# Patient Record
Sex: Female | Born: 1960
Health system: Southern US, Community
[De-identification: ages and names within clinical notes are randomized; demographics above are authoritative.]

## PROBLEM LIST (undated history)

## (undated) DIAGNOSIS — I10 Essential (primary) hypertension: Secondary | ICD-10-CM

## (undated) DIAGNOSIS — G43019 Migraine without aura, intractable, without status migrainosus: Secondary | ICD-10-CM

## (undated) DIAGNOSIS — R259 Unspecified abnormal involuntary movements: Secondary | ICD-10-CM

## (undated) DIAGNOSIS — Z9289 Personal history of other medical treatment: Secondary | ICD-10-CM

## (undated) DIAGNOSIS — J45909 Unspecified asthma, uncomplicated: Secondary | ICD-10-CM

## (undated) DIAGNOSIS — K219 Gastro-esophageal reflux disease without esophagitis: Secondary | ICD-10-CM

## (undated) DIAGNOSIS — J329 Chronic sinusitis, unspecified: Secondary | ICD-10-CM

## (undated) DIAGNOSIS — F329 Major depressive disorder, single episode, unspecified: Secondary | ICD-10-CM

## (undated) DIAGNOSIS — L408 Other psoriasis: Secondary | ICD-10-CM

## (undated) DIAGNOSIS — M1990 Unspecified osteoarthritis, unspecified site: Secondary | ICD-10-CM

## (undated) DIAGNOSIS — K449 Diaphragmatic hernia without obstruction or gangrene: Secondary | ICD-10-CM

## (undated) DIAGNOSIS — F32A Depression, unspecified: Secondary | ICD-10-CM

## (undated) DIAGNOSIS — L709 Acne, unspecified: Secondary | ICD-10-CM

## (undated) DIAGNOSIS — F319 Bipolar disorder, unspecified: Secondary | ICD-10-CM

## (undated) DIAGNOSIS — R251 Tremor, unspecified: Secondary | ICD-10-CM

## (undated) DIAGNOSIS — T7840XA Allergy, unspecified, initial encounter: Secondary | ICD-10-CM

## (undated) HISTORY — DX: Depression, unspecified: F32.A

## (undated) HISTORY — DX: Acne, unspecified: L70.9

## (undated) HISTORY — DX: Diaphragmatic hernia without obstruction or gangrene: K44.9

## (undated) HISTORY — DX: Allergy, unspecified, initial encounter: T78.40XA

## (undated) HISTORY — DX: Other psoriasis: L40.8

## (undated) HISTORY — DX: Gastro-esophageal reflux disease without esophagitis: K21.9

## (undated) HISTORY — DX: Essential (primary) hypertension: I10

## (undated) HISTORY — DX: Unspecified abnormal involuntary movements: R25.9

## (undated) HISTORY — PX: JOINT REPLACEMENT: SHX530

## (undated) HISTORY — DX: Chronic sinusitis, unspecified: J32.9

## (undated) HISTORY — DX: Bipolar disorder, unspecified: F31.9

## (undated) HISTORY — DX: Personal history of other medical treatment: Z92.89

---
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
## 1997-01-10 HISTORY — PX: ESOPHAGOGASTRODUODENOSCOPY: SHX1529

## 1997-07-25 ENCOUNTER — Other Ambulatory Visit: Admission: RE | Admit: 1997-07-25 | Discharge: 1997-07-25 | Payer: Self-pay | Admitting: Obstetrics & Gynecology

## 1997-08-01 ENCOUNTER — Ambulatory Visit (HOSPITAL_COMMUNITY): Admission: RE | Admit: 1997-08-01 | Discharge: 1997-08-01 | Payer: Self-pay | Admitting: Obstetrics & Gynecology

## 1997-08-25 ENCOUNTER — Ambulatory Visit (HOSPITAL_COMMUNITY): Admission: RE | Admit: 1997-08-25 | Discharge: 1997-08-25 | Payer: Self-pay | Admitting: Obstetrics & Gynecology

## 1997-08-25 ENCOUNTER — Encounter: Payer: Self-pay | Admitting: Obstetrics & Gynecology

## 1998-07-31 ENCOUNTER — Other Ambulatory Visit: Admission: RE | Admit: 1998-07-31 | Discharge: 1998-07-31 | Payer: Self-pay | Admitting: Obstetrics & Gynecology

## 1999-08-27 ENCOUNTER — Other Ambulatory Visit: Admission: RE | Admit: 1999-08-27 | Discharge: 1999-08-27 | Payer: Self-pay | Admitting: Obstetrics & Gynecology

## 2000-09-14 ENCOUNTER — Other Ambulatory Visit: Admission: RE | Admit: 2000-09-14 | Discharge: 2000-09-14 | Payer: Self-pay | Admitting: Obstetrics & Gynecology

## 2002-03-18 HISTORY — PX: COLONOSCOPY: SHX174

## 2002-03-18 HISTORY — PX: ESOPHAGOGASTRODUODENOSCOPY: SHX1529

## 2002-03-22 ENCOUNTER — Other Ambulatory Visit: Admission: RE | Admit: 2002-03-22 | Discharge: 2002-03-22 | Payer: Self-pay | Admitting: Obstetrics & Gynecology

## 2002-05-21 ENCOUNTER — Ambulatory Visit (HOSPITAL_COMMUNITY): Admission: RE | Admit: 2002-05-21 | Discharge: 2002-05-21 | Payer: Self-pay | Admitting: Gastroenterology

## 2002-07-18 ENCOUNTER — Ambulatory Visit (HOSPITAL_COMMUNITY): Admission: RE | Admit: 2002-07-18 | Discharge: 2002-07-18 | Payer: Self-pay | Admitting: Surgery

## 2002-07-18 ENCOUNTER — Encounter: Payer: Self-pay | Admitting: Surgery

## 2002-08-22 ENCOUNTER — Encounter: Payer: Self-pay | Admitting: Surgery

## 2002-08-28 HISTORY — PX: LAPAROSCOPIC ESOPHAGOGASTRIC FUNDOPLASTY: SUR767

## 2002-08-29 ENCOUNTER — Inpatient Hospital Stay (HOSPITAL_COMMUNITY): Admission: RE | Admit: 2002-08-29 | Discharge: 2002-08-30 | Payer: Self-pay | Admitting: Surgery

## 2003-04-15 ENCOUNTER — Other Ambulatory Visit: Admission: RE | Admit: 2003-04-15 | Discharge: 2003-04-15 | Payer: Self-pay | Admitting: Obstetrics & Gynecology

## 2003-06-01 ENCOUNTER — Ambulatory Visit (HOSPITAL_COMMUNITY): Admission: RE | Admit: 2003-06-01 | Discharge: 2003-06-01 | Payer: Self-pay | Admitting: Family Medicine

## 2003-06-01 DIAGNOSIS — Z9289 Personal history of other medical treatment: Secondary | ICD-10-CM

## 2003-06-01 HISTORY — DX: Personal history of other medical treatment: Z92.89

## 2003-11-17 ENCOUNTER — Ambulatory Visit: Payer: Self-pay | Admitting: Internal Medicine

## 2003-11-21 LAB — HM DEXA SCAN: HM Dexa Scan: NORMAL

## 2004-04-09 ENCOUNTER — Ambulatory Visit: Payer: Self-pay | Admitting: Family Medicine

## 2004-04-13 ENCOUNTER — Ambulatory Visit: Payer: Self-pay | Admitting: Family Medicine

## 2004-09-09 ENCOUNTER — Other Ambulatory Visit: Admission: RE | Admit: 2004-09-09 | Discharge: 2004-09-09 | Payer: Self-pay | Admitting: Obstetrics & Gynecology

## 2004-09-10 ENCOUNTER — Ambulatory Visit: Payer: Self-pay | Admitting: Family Medicine

## 2004-09-14 ENCOUNTER — Ambulatory Visit: Payer: Self-pay | Admitting: Family Medicine

## 2004-10-21 ENCOUNTER — Ambulatory Visit: Payer: Self-pay | Admitting: Family Medicine

## 2004-11-12 ENCOUNTER — Ambulatory Visit: Payer: Self-pay | Admitting: Family Medicine

## 2005-01-11 ENCOUNTER — Encounter: Payer: Self-pay | Admitting: Family Medicine

## 2005-09-06 ENCOUNTER — Ambulatory Visit: Payer: Self-pay | Admitting: Family Medicine

## 2006-01-06 ENCOUNTER — Ambulatory Visit: Payer: Self-pay | Admitting: Family Medicine

## 2006-02-06 ENCOUNTER — Ambulatory Visit: Payer: Self-pay | Admitting: Family Medicine

## 2006-05-30 ENCOUNTER — Ambulatory Visit: Payer: Self-pay | Admitting: Family Medicine

## 2006-05-30 LAB — CONVERTED CEMR LAB
ALT: 17 units/L (ref 0–40)
AST: 18 units/L (ref 0–37)
Albumin: 3.9 g/dL (ref 3.5–5.2)
Alkaline Phosphatase: 81 units/L (ref 39–117)
BUN: 8 mg/dL (ref 6–23)
Basophils Absolute: 0.1 10*3/uL (ref 0.0–0.1)
Basophils Relative: 0.8 % (ref 0.0–1.0)
Bilirubin, Direct: 0.1 mg/dL (ref 0.0–0.3)
CO2: 31 meq/L (ref 19–32)
Calcium: 9.9 mg/dL (ref 8.4–10.5)
Chloride: 108 meq/L (ref 96–112)
Cholesterol: 160 mg/dL (ref 0–200)
Creatinine, Ser: 0.8 mg/dL (ref 0.4–1.2)
Eosinophils Absolute: 0.2 10*3/uL (ref 0.0–0.6)
Eosinophils Relative: 2.7 % (ref 0.0–5.0)
GFR calc Af Amer: 100 mL/min
GFR calc non Af Amer: 82 mL/min
Glucose, Bld: 100 mg/dL — ABNORMAL HIGH (ref 70–99)
HCT: 39.4 % (ref 36.0–46.0)
HDL: 57.3 mg/dL (ref >39.0)
Hemoglobin: 13.5 g/dL (ref 12.0–15.0)
LDL Cholesterol: 85 mg/dL (ref 0–99)
Lymphocytes Relative: 30.4 % (ref 12.0–46.0)
MCHC: 34.1 g/dL (ref 30.0–36.0)
MCV: 92.7 fL (ref 78.0–100.0)
Monocytes Absolute: 0.8 10*3/uL — ABNORMAL HIGH (ref 0.2–0.7)
Monocytes Relative: 9 % (ref 3.0–11.0)
Neutro Abs: 5 10*3/uL (ref 1.4–7.7)
Neutrophils Relative %: 57.1 % (ref 43.0–77.0)
Platelets: 390 10*3/uL (ref 150–400)
Potassium: 4.6 meq/L (ref 3.5–5.1)
RBC: 4.25 M/uL (ref 3.87–5.11)
RDW: 12.8 % (ref 11.5–14.6)
Sodium: 142 meq/L (ref 135–145)
TSH: 3.27 microintl units/mL (ref 0.35–5.50)
Total Bilirubin: 0.6 mg/dL (ref 0.3–1.2)
Total CHOL/HDL Ratio: 2.8
Total Protein: 6.6 g/dL (ref 6.0–8.3)
Triglycerides: 90 mg/dL (ref 0–149)
VLDL: 18 mg/dL (ref 0–40)
WBC: 8.8 10*3/uL (ref 4.5–10.5)

## 2006-05-31 ENCOUNTER — Encounter: Payer: Self-pay | Admitting: Family Medicine

## 2006-05-31 DIAGNOSIS — E7800 Pure hypercholesterolemia, unspecified: Secondary | ICD-10-CM | POA: Insufficient documentation

## 2006-05-31 DIAGNOSIS — K449 Diaphragmatic hernia without obstruction or gangrene: Secondary | ICD-10-CM | POA: Insufficient documentation

## 2006-05-31 DIAGNOSIS — K219 Gastro-esophageal reflux disease without esophagitis: Secondary | ICD-10-CM | POA: Insufficient documentation

## 2006-05-31 DIAGNOSIS — F319 Bipolar disorder, unspecified: Secondary | ICD-10-CM | POA: Insufficient documentation

## 2006-05-31 DIAGNOSIS — I10 Essential (primary) hypertension: Secondary | ICD-10-CM | POA: Insufficient documentation

## 2006-05-31 LAB — CONVERTED CEMR LAB: Lithium Lvl: 1.02 meq/L (ref 0.80–1.40)

## 2006-06-01 ENCOUNTER — Ambulatory Visit: Payer: Self-pay | Admitting: Family Medicine

## 2006-06-01 DIAGNOSIS — L408 Other psoriasis: Secondary | ICD-10-CM | POA: Insufficient documentation

## 2006-09-29 ENCOUNTER — Encounter: Admission: RE | Admit: 2006-09-29 | Discharge: 2006-09-29 | Payer: Self-pay | Admitting: Obstetrics & Gynecology

## 2007-03-29 ENCOUNTER — Encounter: Admission: RE | Admit: 2007-03-29 | Discharge: 2007-03-29 | Payer: Self-pay | Admitting: Obstetrics & Gynecology

## 2007-05-18 ENCOUNTER — Telehealth: Payer: Self-pay | Admitting: Family Medicine

## 2007-06-05 ENCOUNTER — Telehealth (INDEPENDENT_AMBULATORY_CARE_PROVIDER_SITE_OTHER): Payer: Self-pay | Admitting: *Deleted

## 2007-06-05 ENCOUNTER — Ambulatory Visit: Payer: Self-pay | Admitting: Family Medicine

## 2007-06-05 LAB — CONVERTED CEMR LAB
Alkaline Phosphatase: 47 units/L (ref 39–117)
Basophils Absolute: 0 10*3/uL (ref 0.0–0.1)
Bilirubin, Direct: 0.1 mg/dL (ref 0.0–0.3)
Calcium: 10.4 mg/dL (ref 8.4–10.5)
Cholesterol: 140 mg/dL (ref 0–200)
GFR calc Af Amer: 99 mL/min
GFR calc non Af Amer: 82 mL/min
Glucose, Bld: 167 mg/dL — ABNORMAL HIGH (ref 70–99)
HCT: 39.4 % (ref 36.0–46.0)
HDL: 44.6 mg/dL (ref >39.0)
LDL Cholesterol: 73 mg/dL (ref 0–99)
MCHC: 34.1 g/dL (ref 30.0–36.0)
Monocytes Absolute: 0.6 10*3/uL (ref 0.1–1.0)
Monocytes Relative: 7.6 % (ref 3.0–12.0)
Platelets: 308 10*3/uL (ref 150–400)
Potassium: 3.7 meq/L (ref 3.5–5.1)
RDW: 12.9 % (ref 11.5–14.6)
Sodium: 140 meq/L (ref 135–145)
Total Bilirubin: 0.8 mg/dL (ref 0.3–1.2)
Total CHOL/HDL Ratio: 3.1
Total Protein: 6.3 g/dL (ref 6.0–8.3)
Triglycerides: 111 mg/dL (ref 0–149)

## 2007-06-06 LAB — CONVERTED CEMR LAB: Lithium Lvl: 1.03 meq/L (ref 0.80–1.40)

## 2007-06-07 ENCOUNTER — Ambulatory Visit: Payer: Self-pay | Admitting: Family Medicine

## 2007-06-07 DIAGNOSIS — R7301 Impaired fasting glucose: Secondary | ICD-10-CM | POA: Insufficient documentation

## 2007-06-14 ENCOUNTER — Ambulatory Visit: Payer: Self-pay | Admitting: Family Medicine

## 2007-06-15 LAB — CONVERTED CEMR LAB: Glucose, Bld: 109 mg/dL — ABNORMAL HIGH (ref 70–99)

## 2008-05-05 ENCOUNTER — Telehealth: Payer: Self-pay | Admitting: Family Medicine

## 2008-05-07 ENCOUNTER — Ambulatory Visit: Payer: Self-pay | Admitting: Family Medicine

## 2008-06-30 ENCOUNTER — Telehealth: Payer: Self-pay | Admitting: Family Medicine

## 2008-07-01 ENCOUNTER — Ambulatory Visit: Payer: Self-pay | Admitting: Family Medicine

## 2008-07-01 LAB — CONVERTED CEMR LAB
CO2: 29 meq/L (ref 19–32)
Chloride: 104 meq/L (ref 96–112)
Potassium: 4.6 meq/L (ref 3.5–5.1)
Pro B Natriuretic peptide (BNP): 117 pg/mL — ABNORMAL HIGH (ref 0.0–100.0)
Sodium: 142 meq/L (ref 135–145)

## 2008-07-15 ENCOUNTER — Ambulatory Visit: Payer: Self-pay | Admitting: Family Medicine

## 2008-07-15 DIAGNOSIS — G251 Drug-induced tremor: Secondary | ICD-10-CM | POA: Insufficient documentation

## 2008-07-31 ENCOUNTER — Ambulatory Visit: Payer: Self-pay | Admitting: Family Medicine

## 2008-07-31 LAB — CONVERTED CEMR LAB
CO2: 30 meq/L (ref 19–32)
Calcium: 9.2 mg/dL (ref 8.4–10.5)
Chloride: 110 meq/L (ref 96–112)
Glucose, Bld: 93 mg/dL (ref 70–99)
Potassium: 4.8 meq/L (ref 3.5–5.1)
Sodium: 141 meq/L (ref 135–145)

## 2008-08-06 ENCOUNTER — Ambulatory Visit: Payer: Self-pay | Admitting: Family Medicine

## 2008-08-06 DIAGNOSIS — K5900 Constipation, unspecified: Secondary | ICD-10-CM | POA: Insufficient documentation

## 2008-08-14 ENCOUNTER — Encounter (INDEPENDENT_AMBULATORY_CARE_PROVIDER_SITE_OTHER): Payer: Self-pay | Admitting: *Deleted

## 2009-02-16 LAB — HM MAMMOGRAPHY: HM Mammogram: NORMAL

## 2009-04-10 ENCOUNTER — Ambulatory Visit: Payer: Self-pay | Admitting: Family Medicine

## 2009-04-10 DIAGNOSIS — J301 Allergic rhinitis due to pollen: Secondary | ICD-10-CM | POA: Insufficient documentation

## 2009-04-10 DIAGNOSIS — H6120 Impacted cerumen, unspecified ear: Secondary | ICD-10-CM | POA: Insufficient documentation

## 2009-04-23 ENCOUNTER — Ambulatory Visit: Payer: Self-pay | Admitting: Family Medicine

## 2009-05-04 ENCOUNTER — Ambulatory Visit: Payer: Self-pay | Admitting: Family Medicine

## 2009-05-04 LAB — CONVERTED CEMR LAB
ALT: 19 units/L (ref 0–35)
AST: 20 units/L (ref 0–37)
BUN: 8 mg/dL (ref 6–23)
Basophils Relative: 0.7 % (ref 0.0–3.0)
Bilirubin, Direct: 0.1 mg/dL (ref 0.0–0.3)
CO2: 28 meq/L (ref 19–32)
Chloride: 103 meq/L (ref 96–112)
Cholesterol: 180 mg/dL (ref 0–200)
Creatinine, Ser: 0.7 mg/dL (ref 0.4–1.2)
Creatinine,U: 10.8 mg/dL
HCT: 40.8 % (ref 36.0–46.0)
Hemoglobin: 13.8 g/dL (ref 12.0–15.0)
LDL Cholesterol: 93 mg/dL (ref 0–99)
Lymphocytes Relative: 24 % (ref 12.0–46.0)
Lymphs Abs: 1.8 10*3/uL (ref 0.7–4.0)
Microalb Creat Ratio: 9.3 mg/g (ref 0.0–30.0)
Monocytes Relative: 8.5 % (ref 3.0–12.0)
Neutro Abs: 4.8 10*3/uL (ref 1.4–7.7)
Potassium: 4.8 meq/L (ref 3.5–5.1)
RBC: 4.16 M/uL (ref 3.87–5.11)
Total Bilirubin: 0.7 mg/dL (ref 0.3–1.2)
Total CHOL/HDL Ratio: 3
Total Protein: 6.5 g/dL (ref 6.0–8.3)
Triglycerides: 75 mg/dL (ref 0.0–149.0)

## 2009-05-05 LAB — CONVERTED CEMR LAB: Vit D, 25-Hydroxy: 21 ng/mL — ABNORMAL LOW (ref 30–89)

## 2009-05-06 ENCOUNTER — Ambulatory Visit: Payer: Self-pay | Admitting: Family Medicine

## 2009-06-06 IMAGING — MG MM DIAGNOSTIC UNILATERAL R
4 series · 4 of 4 positions shown · non-contrast
Comparison: Mammogram 09/29/06, 09/22/06 as well as 09/09/04.

DG DIAGNOSTIC UNILATERAL R
CC and MLO view(s) were taken of the right breast.

DIGITAL UNILATERAL RIGHT DIAGNOSTIC MAMMOGRAM:
CLINICAL DATA: Six month follow-up calcifications right breast.

[R CC (1 of 2)]
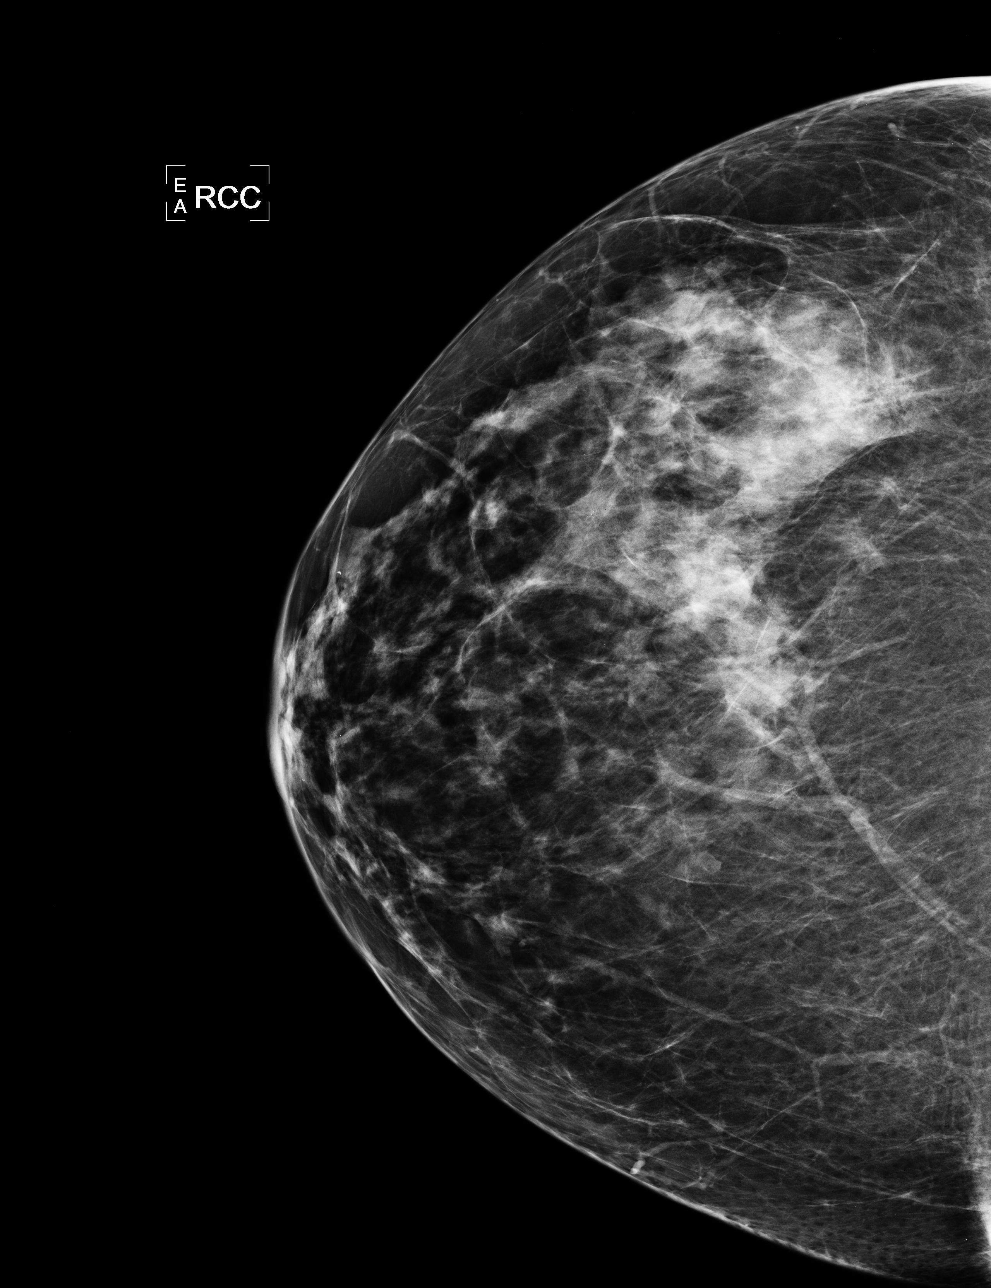

[R MLO]
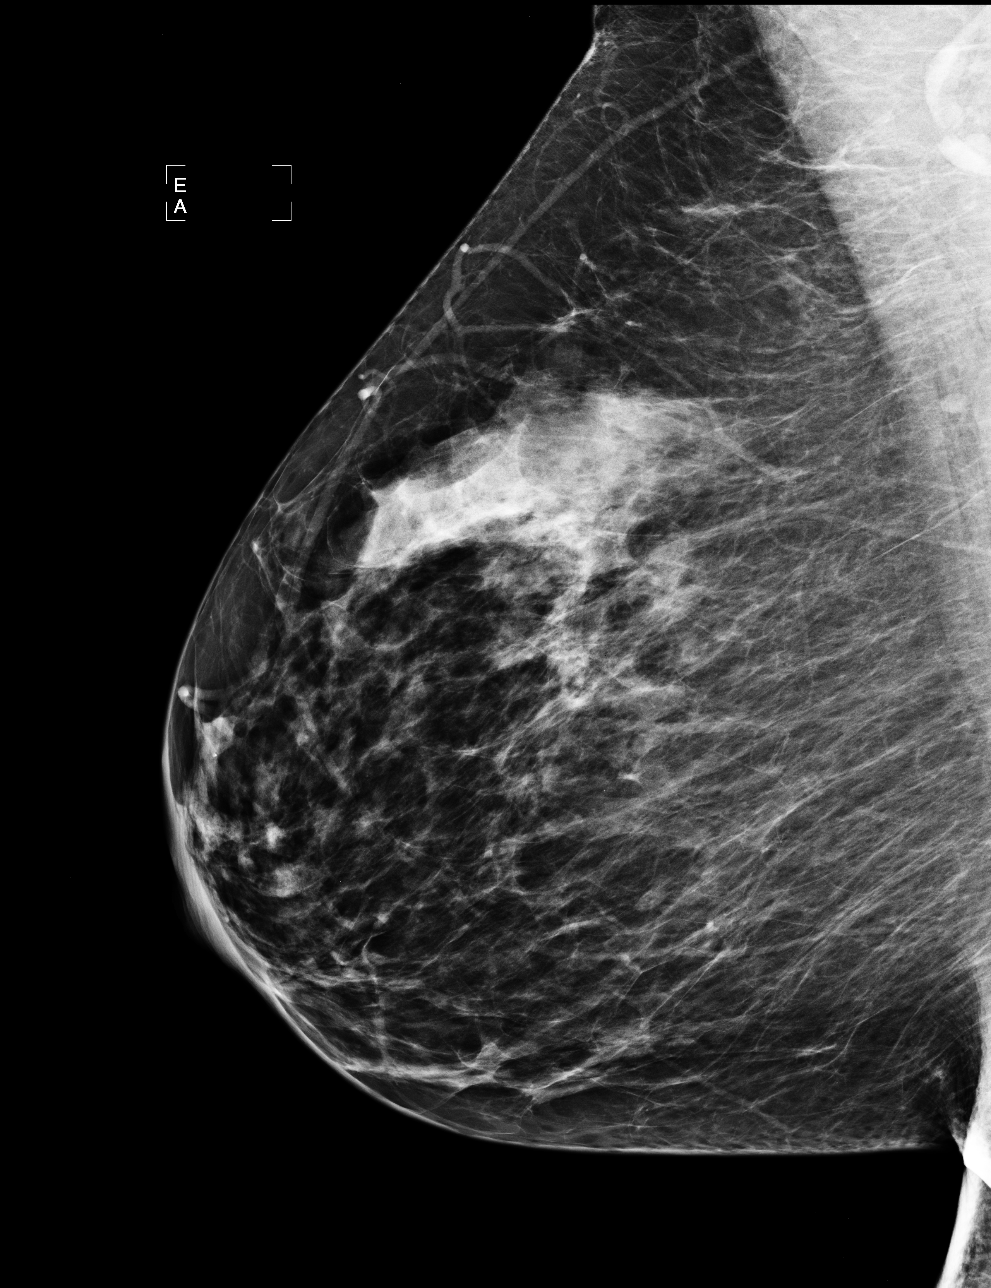

[R CC (2 of 2)]
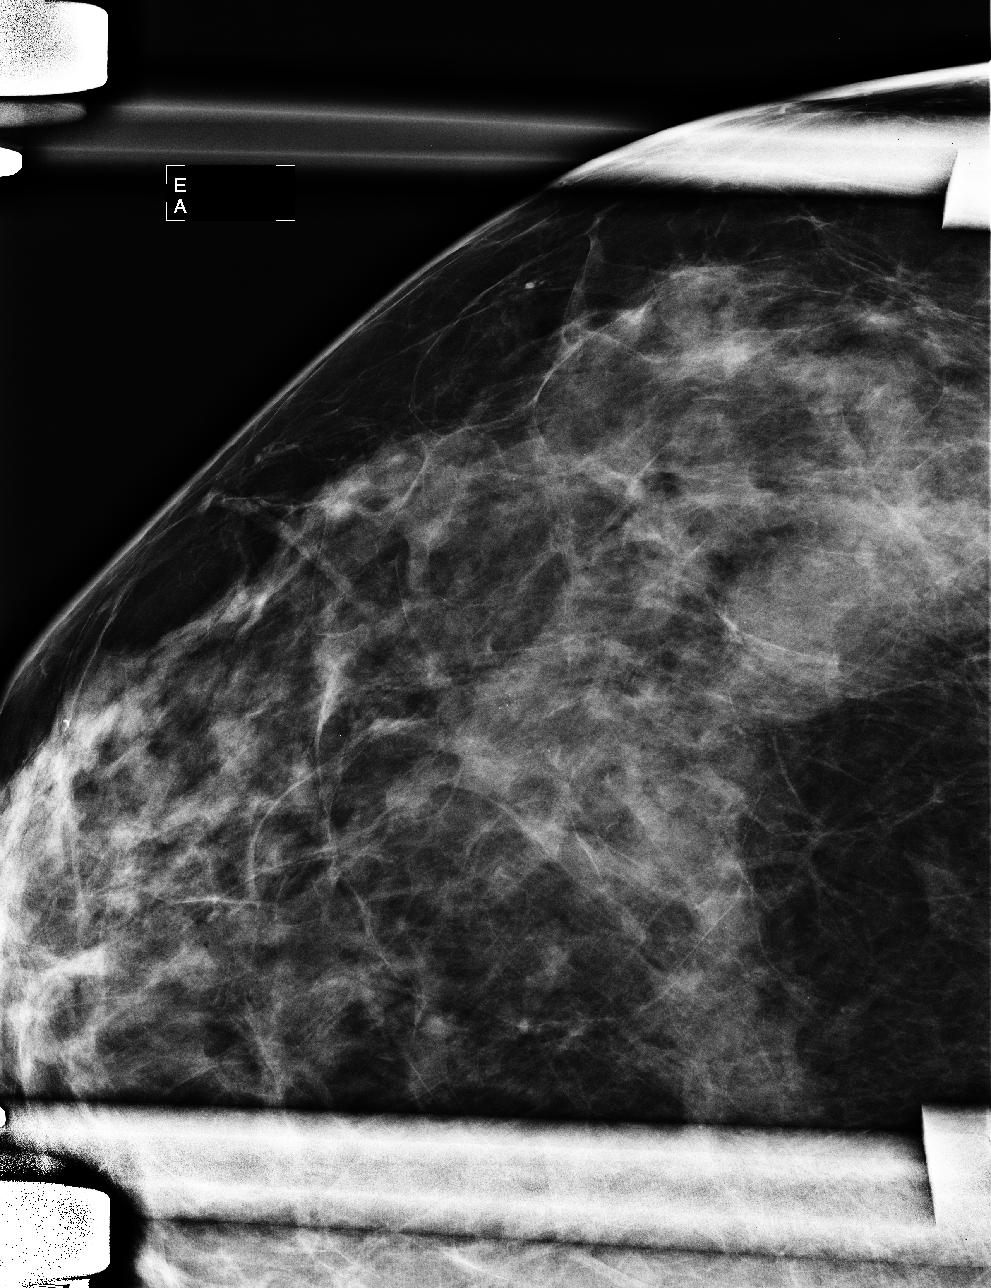

[R ML]
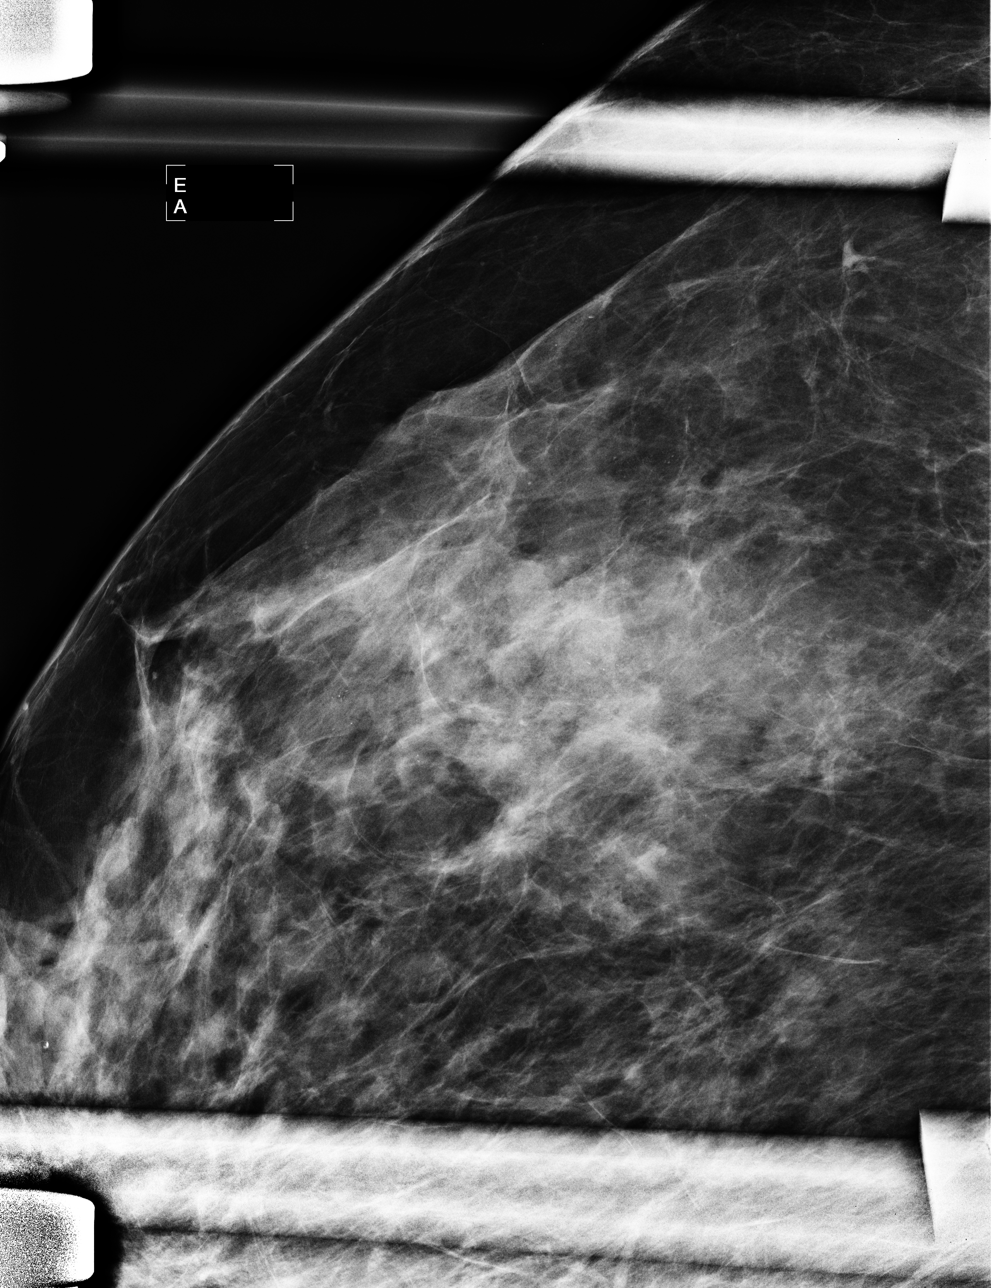

[4 of 4 positions shown; findings below may reference images not displayed]

CC and MLO views of the right breast and spot magnification CC and lateral view of right breast are
submitted for review.  Previously noted punctate small calcifications are again identified in the 
upper outer quadrant with no associated mass.  They are stable without interval change.
IMPRESSION: Benign findings.  Recommend routine screening mammogram back on schedule.

ASSESSMENT: Benign - BI-RADS 2

Screening mammogram of both breasts in 1 year.
, THIS PROCEDURE WAS A DIGITAL MAMMOGRAM.

## 2009-08-18 ENCOUNTER — Encounter (INDEPENDENT_AMBULATORY_CARE_PROVIDER_SITE_OTHER): Payer: Self-pay | Admitting: *Deleted

## 2010-02-11 NOTE — Assessment & Plan Note (Signed)
Summary: DRAINAGE,NAUSEA/CLE   Vital Signs:  Patient profile:   50 year old female Height:      62 inches Weight:      159.25 pounds BMI:     29.23 Temp:     98.5 degrees F oral Pulse rate:   50 / minute Pulse rhythm:   regular BP sitting:   108 / 74  (left arm) Cuff size:   regular  Vitals Entered By: Lewanda Rife LPN (April 10, 9145 10:14 AM) CC: drainage at back of throat, nausea   History of Present Illness: is really nauseated  phlegm in the back of her throat at least 3 days  R ear is stopped up -- does use Q tips  does not think she has a cold   does have allergies this time of year  is having constant runny nose -- clear  sneezing a little - not too bad  not congestion  no cough  no facial pain at all   does take zyrtec allergy one a day at night   ? if she may have exac of her chronic sinusitis  does not think her nausea is from her psych meds   is on erythromycin for acne -- in pms for 2-3 weeks is from Dr Londell Moh   Allergies: 1)  ! Minocin (Minocycline Hcl) 2)  * Mellaril (Thioridazine Hcl) 3)  Hydrocodone-Homatropine (Hydrocodone-Homatropine)  Past History:  Past Surgical History: Last updated: 05/31/2006 EGD ([facility]) H. H., Gerd, negative gastritis 01/99 Colonoscopy, int. hemorrhoids 03/18/02 EGD, H. H., Gerd, Gastritis, dilated 03/18/02 Lap fundoplication Daphine Deutscher) 08/28/2002 MRI Brain with and without, wnl 06/01/03 DEXA- wnl 11/21/03  Family History: Last updated: 06/07/2007 Father: dec 50's, was an alcoholic, panhandler, was felt to have been murder on the streets. Mother: A 65 Fibromyalgia arthritis   muscle spasms in the neck Brother A  42 Sister A 43  Social History: Last updated: 05/31/2006 Marital Status: Married Children: None Occupation Housewife  Risk Factors: Caffeine Use: 0 (06/01/2006) Exercise: no (06/07/2007)  Risk Factors: Smoking Status: quit (05/31/2006) Passive Smoke Exposure: no (06/07/2007)  Past Medical  History: Allergic rhinitis GERD Hypertension acne?      derm- Dr Londell Moh  Review of Systems General:  Complains of fatigue; denies chills, fever, loss of appetite, and malaise. Eyes:  Denies discharge and eye irritation. ENT:  Complains of nasal congestion, postnasal drainage, and sinus pressure; denies ear discharge, hoarseness, and sore throat. CV:  Denies chest pain or discomfort, palpitations, shortness of breath with exertion, and swelling of feet. Resp:  Denies cough and wheezing. GI:  Complains of change in bowel habits and nausea; denies indigestion and vomiting; occ low stomach cramp and loose stool- not all the time . GU:  Denies dysuria and hematuria. MS:  Denies joint pain. Derm:  Denies lesion(s), poor wound healing, and rash. Neuro:  Complains of tremors; denies headaches, numbness, and tingling; tremor is her baseline. Psych:  mood is overall stable . Endo:  Denies cold intolerance, excessive thirst, excessive urination, and heat intolerance. Heme:  Denies abnormal bruising and bleeding.  Physical Exam  General:  well appearing  Head:  normocephalic, atraumatic, and no abnormalities observed.  no sinus or temporal tenderness  Eyes:  vision grossly intact, pupils equal, pupils round, pupils reactive to light, and no injection.   Ears:  bilat cerumen impaction - worse on R  deep and dry appearing  poor hearing noted  Nose:  nares are boggy and pale with moderate clear rhinorrhea  Mouth:  pharynx pink and moist, no erythema, and no exudates.  moderate clear post nasal drip noted  Neck:  No deformities, masses, or tenderness noted. Chest Wall:  No deformities, masses, or tenderness noted. Lungs:  Normal respiratory effort, chest expands symmetrically. Lungs are clear to auscultation, no crackles or wheezes. Heart:  Normal rate and regular rhythm. S1 and S2 normal without gallop, murmur, click, rub or other extra sounds. Abdomen:  Bowel sounds positive,abdomen soft  and non-tender without masses, organomegaly or hernias noted. Neurologic:  sensation intact to light touch, gait normal, and DTRs symmetrical and normal.  baseline tremor noted  Skin:  Intact without suspicious lesions or rashes Cervical Nodes:  No lymphadenopathy noted Psych:  seemed mildly anxious instructions repeated and read to her several times slowly until she voiced understanding    Impression & Recommendations:  Problem # 1:  NAUSEA (ICD-787.02) Assessment Deteriorated I think this is from combination of adv eff from erythromycin and also allergic post nasal drip adv to stop the abx and call her dermatologist  also continue zyrtec and start flonase  update if worse or not imp Her updated medication list for this problem includes:    Zyrtec Allergy 10 Mg Tabs (Cetirizine hcl) .Marland Kitchen... Take 1 tablet by mouth once a day    Promethazine Hcl 25 Mg Tabs (Promethazine hcl) ..... One tab by mouth every 6 hrs as needed for nausea.  Problem # 2:  ALLERGIC RHINITIS, SEASONAL (ICD-477.0) Assessment: New  with runny nose and sneezing - already on zyrtec will add flonase and update if not mp  update if sinus pain or fever (no sympt of sinusitis now)  Orders: Prescription Created Electronically [phone])  Problem # 3:  CERUMEN IMPACTION, BILATERAL (ICD-380.4) Assessment: New worse in R ear - very deep and dry appearing inst to use debrox as directed otc twice weekly in both ears  then f/u for ear irrigation in 2 weeks  update if worse or ear pain or dizziness   Complete Medication List: 1)  Buspirone Hcl 7.5 Mg Tabs (Buspirone hcl) .... Take one by mouth three times a day 2)  Lithium Carbonate 300 Mg Caps (Lithium carbonate) .... Take 2 every morning by mouth 3)  Wellbutrin Xl 150 Mg Tb24 (Bupropion hcl) .... Take 2 by mouth every morning 4)  Geodon 80 Mg Caps (Ziprasidone hcl) .... Take 3 toblets at bedtime with food 5)  Alprazolam 0.25 Mg Tabs (Alprazolam) .... Take one by mouth two  times a day prn 6)  Zyrtec Allergy 10 Mg Tabs (Cetirizine hcl) .... Take 1 tablet by mouth once a day 7)  Propranolol Hcl 10 Mg Tabs (Propranolol hcl) .... Take 3 tablets per day 8)  Lisinopril 10 Mg Tabs (Lisinopril) .... Oine tab by mouth once daily 9)  Promethazine Hcl 25 Mg Tabs (Promethazine hcl) .... One tab by mouth every 6 hrs as needed for nausea. 10)  E.e.s. 400 400 Mg Tabs (Erythromycin ethylsuccinate) .... Take one in pm 11)  Flonase 50 Mcg/act Susp (Fluticasone propionate) .... 2 sprays in each nostril once daily  Patient Instructions: 1)  stop the erythromycin- I think it is causing your nausea  2)  call Dr Londell Moh- ask if he wants to try something different  3)  use debrox solution (over the counter) - 5 drops in each ear twice weekly for 2 weeks  4)  then follow up with Dr Hetty Ely for ear flushing in 2 weeks  5)  take your zyrtec daily for  allergies 6)  start flonase nasal spray through the spring season also -- I will send px to your pharmacy  Prescriptions: FLONASE 50 MCG/ACT SUSP (FLUTICASONE PROPIONATE) 2 sprays in each nostril once daily  #1 mdi x 11   Entered and Authorized by:   Judith Part MD   Signed by:   Judith Part MD on 04/10/2009   Method used:   Electronically to        [facility]  [address] (retail)       [address]       [place], [place]  [address]       Ph: [phone] or [phone]       Fax: [phone]   RxID:   [id number]   Current Allergies (reviewed today): ! MINOCIN (MINOCYCLINE HCL) * MELLARIL (THIORIDAZINE HCL) HYDROCODONE-HOMATROPINE (HYDROCODONE-HOMATROPINE)

## 2010-02-11 NOTE — Letter (Signed)
Summary: Nadara Eaton letter  [facility]  [address] [place], [place] [address]   Phone: [phone]  Fax: [phone]       08/18/2009 MRN: [id number]  FAELYNN WYNDER 2007 [address] [place], [place]  [address]  Dear Ms. [facility] [place], and [facility] announce the retirement of Arta Silence, M.D., from full-time practice at the [facility] office effective July 09, 2009 and his plans of returning part-time.  It is important to Dr. Hetty Ely and to our practice that you understand that [facility] has seven physicians in our office for your health care needs.  We will continue to offer the same exceptional care that you have today.    Dr. Hetty Ely has spoken to many of you about his plans for retirement and returning part-time in the fall.   We will continue to work with you through the transition to schedule appointments for you in the office and meet the high standards that [place] is committed to.   Again, it is with great pleasure that we share the news that Dr. Hetty Ely will return to [facility] in October of 2011 with a reduced schedule.    If you have any questions, or would like to request an appointment with one of our physicians, please call us at [phone] and press the option for Scheduling an appointment.  We take pleasure in providing you with excellent patient care and look forward to seeing you at your next office visit.  Our [facility] Physicians are:  Tillman Abide, M.D. Laurita Quint, M.D. Roxy Manns, M.D. Kerby Nora, M.D. Hannah Beat, M.D. Ruthe Mannan, M.D. We proudly welcomed Raechel Ache, M.D. and Eustaquio Boyden, M.D. to the practice in July/August 2011.  Sincerely,  [facility]

## 2010-02-11 NOTE — Assessment & Plan Note (Signed)
Summary: 2 week follow up per dr tower/[facility]   Vital Signs:  Patient profile:   50 year old female Weight:      162.25 pounds Temp:     98.2 degrees F oral Pulse rate:   64 / minute Pulse rhythm:   regular BP sitting:   110 / 70  (left arm) Cuff size:   regular  Vitals Entered By: Sydell Axon LPN (April 23, 2009 9:46 AM) CC: 2 week follow-up per Dr. Milinda Antis, needs ears flushed out   History of Present Illness: Pt here due to seeing Dr Milinda Antis for nausea felt to be due to meds. She is still nauseated a lot. Pepcid once a day helps and she hasn't needed to take it every day.  The Derm said to just quit taking Emycin and he will change at next appt.  She is again having muffled hearing. No fever or chills, no cough or ST.  Problems Prior to Update: 1)  Cerumen Impaction, Bilateral  (ICD-380.4) 2)  Allergic Rhinitis, Seasonal  (ICD-477.0) 3)  Constipation  (ICD-564.00) 4)  Tremor  (ICD-781.0) 5)  Nausea  (ICD-787.02) 6)  Edema  (ICD-782.3) 7)  Impaired Fasting Glucose  (ICD-790.21) 8)  Health Maintenance Exam  (ICD-V70.0) 9)  Hiatal Hernia  (ICD-553.3) 10)  Sinusitis, Chrnic  (ICD-473.9) 11)  Gerd Via Egd  (ICD-530.81) 12)  Allergic Rhinitis  (ICD-477.9) 13)  Manic Depressive Illness  (ICD-296.80) 14)  Psoriasis Nec  (ICD-696.1) 15)  Hypercholesterolemia, Pure  (ICD-272.0) 16)  Hypertension, Benign Essential  (ICD-401.1)  Medications Prior to Update: 1)  Buspirone Hcl 7.5 Mg Tabs (Buspirone Hcl) .... Take One By Mouth Three Times A Day 2)  Lithium Carbonate 300 Mg Caps (Lithium Carbonate) .... Take 2 Every Morning By Mouth 3)  Wellbutrin Xl 150 Mg Tb24 (Bupropion Hcl) .... Take 2 By Mouth Every Morning 4)  Geodon 80 Mg Caps (Ziprasidone Hcl) .... Take 3 Toblets At Bedtime With Food 5)  Alprazolam 0.25 Mg Tabs (Alprazolam) .... Take One By Mouth Two Times A Day Prn 6)  Zyrtec Allergy 10 Mg Tabs (Cetirizine Hcl) .... Take 1 Tablet By Mouth Once A Day 7)  Propranolol Hcl 10 Mg Tabs  (Propranolol Hcl) .... Take 3 Tablets Per Day 8)  Lisinopril 10 Mg Tabs (Lisinopril) .... Oine Tab By Mouth Once Daily 9)  Promethazine Hcl 25 Mg Tabs (Promethazine Hcl) .... One Tab By Mouth Every 6 Hrs As Needed For Nausea. 10)  E.e.s. 400 400 Mg Tabs (Erythromycin Ethylsuccinate) .... Take One in Pm 11)  Flonase 50 Mcg/act Susp (Fluticasone Propionate) .... 2 Sprays in Each Nostril Once Daily  Allergies: 1)  ! Minocin (Minocycline Hcl) 2)  ! E.e.s. 400 (Erythromycin Ethylsuccinate) 3)  * Mellaril (Thioridazine Hcl) 4)  Hydrocodone-Homatropine (Hydrocodone-Homatropine)  Physical Exam  General:  Well-developed,well-nourished,in no acute distress; alert,appropriate and cooperative throughout examination Head:  normocephalic, atraumatic, and no abnormalities observed.  no sinus or temporal tenderness  Eyes:  Conjunctiva clear bilaterally.  Ears:  Impacted bilaterally, TMs ok behind once cleared. Nose:  nares are boggy and pale with moderate clear rhinorrhea  Mouth:  pharynx pink and moist, no erythema, and no exudates.  moderate clear post nasal drip noted  Neck:  No deformities, masses, or tenderness noted. Lungs:  Normal respiratory effort, chest expands symmetrically. Lungs are clear to auscultation, no crackles or wheezes. Heart:  Normal rate and regular rhythm. S1 and S2 normal without gallop, murmur, click, rub or other extra sounds. Abdomen:  Bowel  sounds positive,abdomen soft and non-tender without masses, organomegaly or hernias noted.   Impression & Recommendations:  Problem # 1:  NAUSEA (ICD-787.02) Assessment Improved Better, cont Pepcid daily for two months for presumed GERD and gastritis causing nausea. Then taper. Her updated medication list for this problem includes:    Zyrtec Allergy 10 Mg Tabs (Cetirizine hcl) .Marland Kitchen... Take 1 tablet by mouth once a day    Promethazine Hcl 25 Mg Tabs (Promethazine hcl) ..... One tab by mouth every 6 hrs as needed for nausea.  Problem #  2:  ALLERGIC RHINITIS, SEASONAL (ICD-477.0) Assessment: Unchanged Stable at present.  Problem # 3:  CERUMEN IMPACTION, BILATERAL (ICD-380.4) Assessment: Improved  Irrigated clear with irrigation device.  Orders: Cerumen Impaction Removal (11914)  Problem # 4:  HYPERTENSION, BENIGN ESSENTIAL (ICD-401.1) Assessment: Unchanged Stable. Her updated medication list for this problem includes:    Propranolol Hcl 10 Mg Tabs (Propranolol hcl) .Marland Kitchen... Take 3 tablets per day    Lisinopril 10 Mg Tabs (Lisinopril) ..... Oine tab by mouth once daily  BP today: 110/70 Prior BP: 108/74 (04/10/2009)  Labs Reviewed: K+: 4.8 (07/31/2008) Creat: : 0.8 (07/31/2008)   Chol: 140 (06/05/2007)   HDL: 44.6 (06/05/2007)   LDL: 73 (06/05/2007)   TG: 111 (06/05/2007)  Complete Medication List: 1)  Buspirone Hcl 7.5 Mg Tabs (Buspirone hcl) .... Take one by mouth three times a day 2)  Lithium Carbonate 300 Mg Caps (Lithium carbonate) .... Take 2 every morning by mouth 3)  Wellbutrin Xl 150 Mg Tb24 (Bupropion hcl) .... Take 2 by mouth every morning 4)  Geodon 80 Mg Caps (Ziprasidone hcl) .... Take 3 toblets at bedtime with food 5)  Alprazolam 0.25 Mg Tabs (Alprazolam) .... Take one by mouth two times a day prn 6)  Zyrtec Allergy 10 Mg Tabs (Cetirizine hcl) .... Take 1 tablet by mouth once a day 7)  Propranolol Hcl 10 Mg Tabs (Propranolol hcl) .... Take 3 tablets per day 8)  Lisinopril 10 Mg Tabs (Lisinopril) .... Oine tab by mouth once daily 9)  Promethazine Hcl 25 Mg Tabs (Promethazine hcl) .... One tab by mouth every 6 hrs as needed for nausea. 10)  Flonase 50 Mcg/act Susp (Fluticasone propionate) .... 2 sprays in each nostril once daily  Patient Instructions: 1)  Look for appt for Comp Exam...?list for cancellations? prior to my retirement. Labs prior.  Prior Medications: BUSPIRONE HCL 7.5 MG TABS (BUSPIRONE HCL) Take one by mouth three times a day LITHIUM CARBONATE 300 MG CAPS (LITHIUM CARBONATE) Take 2  every morning by mouth WELLBUTRIN XL 150 MG TB24 (BUPROPION HCL) Take 2 by mouth every morning GEODON 80 MG CAPS (ZIPRASIDONE HCL) Take 3 toblets at bedtime with food ALPRAZOLAM 0.25 MG TABS (ALPRAZOLAM) Take one by mouth two times a day prn ZYRTEC ALLERGY 10 MG TABS (CETIRIZINE HCL) Take 1 tablet by mouth once a day PROPRANOLOL HCL 10 MG TABS (PROPRANOLOL HCL) Take 3 tablets per day LISINOPRIL 10 MG TABS (LISINOPRIL) oine tab by mouth once daily PROMETHAZINE HCL 25 MG TABS (PROMETHAZINE HCL) one tab by mouth every 6 hrs as needed for nausea. FLONASE 50 MCG/ACT SUSP (FLUTICASONE PROPIONATE) 2 sprays in each nostril once daily Current Allergies (reviewed today): ! MINOCIN (MINOCYCLINE HCL) ! E.E.S. 400 (ERYTHROMYCIN ETHYLSUCCINATE) * MELLARIL (THIORIDAZINE HCL) HYDROCODONE-HOMATROPINE (HYDROCODONE-HOMATROPINE)

## 2010-02-11 NOTE — Assessment & Plan Note (Signed)
Summary: CPX/RBH   Vital Signs:  Patient profile:   50 year old female Weight:      160.25 pounds Temp:     98.3 degrees F oral Pulse rate:   60 / minute Pulse rhythm:   regular BP sitting:   100 / 68  (left arm) Cuff size:   regular  Vitals Entered By: Sydell Axon LPN (May 06, 2009 10:44 AM) CC: 30 Minute checkup, sees Dr. Jennette Kettle for her GYN care, had a pap 02/11, Preventive Care   CC:  30 Minute checkup, sees Dr. Jennette Kettle for her GYN care, had a pap 02/11, and Preventive Care.  History of Present Illness: Pt here for Comp Exam. Sees Dr Jennette Kettle and had Pap in Feb...nml. Her abd complaints are now reslved. The Emycin  she was taking was making her sick. She has no complaints. She is fighting allergiea, taking Zyrtec and Flonase and doing well. Eyes are sometimes itchy, handling it ok.  Preventive Screening-Counseling & Management  Alcohol-Tobacco     Alcohol drinks/day: <1 Rarely     Alcohol type: Vodka     Smoking Status: quit     Year Quit: 1990     Pack years: 10     Passive Smoke Exposure: no  Caffeine-Diet-Exercise     Caffeine use/day: 3     Does Patient Exercise: no     Type of exercise: treadmill     Times/week: 5  Problems Prior to Update: 1)  Cerumen Impaction, Bilateral  (ICD-380.4) 2)  Allergic Rhinitis, Seasonal  (ICD-477.0) 3)  Constipation  (ICD-564.00) 4)  Tremor  (ICD-781.0) 5)  Nausea  (ICD-787.02) 6)  Edema  (ICD-782.3) 7)  Impaired Fasting Glucose  (ICD-790.21) 8)  Health Maintenance Exam  (ICD-V70.0) 9)  Hiatal Hernia  (ICD-553.3) 10)  Sinusitis, Chrnic  (ICD-473.9) 11)  Gerd Via Egd  (ICD-530.81) 12)  Allergic Rhinitis  (ICD-477.9) 13)  Manic Depressive Illness  (ICD-296.80) 14)  Psoriasis Nec  (ICD-696.1) 15)  Hypercholesterolemia, Pure  (ICD-272.0) 16)  Hypertension, Benign Essential  (ICD-401.1)  Medications Prior to Update: 1)  Buspirone Hcl 7.5 Mg Tabs (Buspirone Hcl) .... Take One By Mouth Three Times A Day 2)  Lithium Carbonate 300 Mg  Caps (Lithium Carbonate) .... Take 2 Every Morning By Mouth 3)  Wellbutrin Xl 150 Mg Tb24 (Bupropion Hcl) .... Take 2 By Mouth Every Morning 4)  Geodon 80 Mg Caps (Ziprasidone Hcl) .... Take 3 Toblets At Bedtime With Food 5)  Alprazolam 0.25 Mg Tabs (Alprazolam) .... Take One By Mouth Two Times A Day Prn 6)  Zyrtec Allergy 10 Mg Tabs (Cetirizine Hcl) .... Take 1 Tablet By Mouth Once A Day 7)  Propranolol Hcl 10 Mg Tabs (Propranolol Hcl) .... Take 3 Tablets Per Day 8)  Lisinopril 10 Mg Tabs (Lisinopril) .... Oine Tab By Mouth Once Daily 9)  Promethazine Hcl 25 Mg Tabs (Promethazine Hcl) .... One Tab By Mouth Every 6 Hrs As Needed For Nausea. 10)  Flonase 50 Mcg/act Susp (Fluticasone Propionate) .... 2 Sprays in Each Nostril Once Daily  Allergies: 1)  ! Minocin (Minocycline Hcl) 2)  ! E.e.s. 400 (Erythromycin Ethylsuccinate) 3)  * Mellaril (Thioridazine Hcl) 4)  Hydrocodone-Homatropine (Hydrocodone-Homatropine)  Past History:  Past Medical History: Last updated: 04/10/2009 Allergic rhinitis GERD Hypertension acne?      derm- Dr Londell Moh  Past Surgical History: Last updated: 05/31/2006 EGD ([facility]) [facility]. [facility]., Gerd, negative gastritis 01/99 Colonoscopy, int. hemorrhoids 03/18/02 EGD, [facility]. [facility]., Gerd, Gastritis, dilated 03/18/02 Lap fundoplication (  [name]) 08/28/2002 MRI Brain with and without, wnl 06/01/03 DEXA- wnl 11/21/03  Family History: Last updated: 05/06/2009 Father: dec 50's, was an alcoholic, panhandler, was felt to have been murder on the streets. Mother: A 63  Fibromyalgia arthritis   muscle spasms in the neck Brother A  45 Emphysema Smoker Sister A 36  Social History: Last updated: 05/31/2006 Marital Status: Married Children: None Occupation Housewife  Risk Factors: Alcohol Use: <1 Rarely (05/06/2009) Caffeine Use: 3 (05/06/2009) Exercise: no (05/06/2009)  Risk Factors: Smoking Status: quit (05/06/2009) Passive Smoke Exposure: no (05/06/2009)  Family  History: Father: dec 19's, was an alcoholic, panhandler, was felt to have been murder on the streets. Mother: A 76  Fibromyalgia arthritis   muscle spasms in the neck Brother A  45 Emphysema Smoker Sister A 32  Social History: Caffeine use/day:  3  Review of Systems General:  Complains of fatigue and weakness; denies chills, fever, sweats, and weight loss; occas. Eyes:  Denies blurring, discharge, and eye pain; improved. ENT:  Complains of decreased hearing; denies earache and ringing in ears. CV:  Denies chest pain or discomfort, fainting, fatigue, palpitations, shortness of breath with exertion, swelling of feet, and swelling of hands. Resp:  Complains of wheezing; denies cough and shortness of breath; raely. GI:  Complains of constipation; denies abdominal pain, bloody stools, change in bowel habits, dark tarry stools, diarrhea, indigestion, loss of appetite, nausea, vomiting, vomiting blood, and yellowish skin color; rare nausea, constipation better with Miralax. GU:  Complains of nocturia; denies discharge, dysuria, and urinary frequency; occas. MS:  Denies joint pain, low back pain, muscle aches, cramps, and stiffness. Derm:  Complains of rash; denies dryness and itching; acne. Neuro:  Complains of tremors; denies numbness, poor balance, and tingling; chronic.  Physical Exam  General:  Well-developed,well-nourished,in no acute distress; alert,appropriate and cooperative throughout examination Head:  Normocephalic and atraumatic without obvious abnormalities. No apparent alopecia or balding but mild thinning of the hair. Sinuses NT. Eyes:  Palpebral conj mildly inflamed. Ears:  External ear exam shows no significant lesions or deformities.  Otoscopic examination reveals clear canals, tympanic membranes are intact bilaterally without bulging, retraction, inflammation or discharge. Hearing is grossly normal bilaterally. TMS mildly scarred/ thick. Nose:  nares are boggy and pale with  moderate clear rhinorrhea  Mouth:  pharynx pink and moist, no erythema, and no exudates.  moderate clear post nasal drip noted  Neck:  No deformities, masses, or tenderness noted. Chest Wall:  No deformities, masses, or tenderness noted. Breasts:  Not done, gyn. Lungs:  Normal respiratory effort, chest expands symmetrically. Lungs are clear to auscultation, no crackles or wheezes. Heart:  Normal rate and regular rhythm. S1 and S2 normal without gallop, murmur, click, rub or other extra sounds. Abdomen:  Bowel sounds positive,abdomen soft and non-tender without masses, organomegaly or hernias noted. Rectal:  Not done, gyn Genitalia:  Not done, gyn Msk:  No deformity or scoliosis noted of thoracic or lumbar spine.   Pulses:  R and L carotid,radial,femoral,dorsalis pedis and posterior tibial pulses are full and equal bilaterally Extremities:  No clubbing, cyanosis, edema, or deformity noted with normal full range of motion of all joints.   Neurologic:  No cranial nerve deficits noted. Station and gait are normal. Sensory, motor and coordinative functions appear intact. Skin:  Intact without suspicious lesions or rashes except mild facial acne. Cervical Nodes:  No lymphadenopathy noted Inguinal Nodes:  No significant adenopathy Psych:  seemed mildly anxious instructions repeated and read to her  several times slowly until she voiced understanding    Impression & Recommendations:  Problem # 1:  HEALTH MAINTENANCE EXAM (ICD-V70.0) Discussed diet and exercise, labs and prohylactic allergy trmt.  Problem # 2:  ALLERGIC RHINITIS, SEASONAL (ICD-477.0) Assessment: Unchanged Reasonably controlled.  Problem # 3:  CONSTIPATION (ICD-564.00) Assessment: Improved  Problem # 4:  TREMOR (ICD-781.0) Assessment: Unchanged Stable.  Problem # 5:  IMPAIRED FASTING GLUCOSE (ICD-790.21) Assessment: Unchanged  St[place]kle, mildly elevated....avoid sweets and carbs.  Labs Reviewed: Creat: 0.7 (05/04/2009)      Problem # 6:  HYPERCHOLESTEROLEMIA, PURE (ICD-272.0) Assessment: Unchanged Great. Labs Reviewed: SGOT: 20 (05/04/2009)   SGPT: 19 (05/04/2009)   HDL:71.70 (05/04/2009), 44.6 (06/05/2007)  LDL:93 (05/04/2009), 73 (06/05/2007)  Chol:180 (05/04/2009), 140 (06/05/2007)  Trig:75.0 (05/04/2009), 111 (06/05/2007)  Problem # 7:  HYPERTENSION, BENIGN ESSENTIAL (ICD-401.1) Assessment: Improved Good nos. Her updated medication list for this problem includes:    Propranolol Hcl 10 Mg Tabs (Propranolol hcl) .Marland Kitchen... Take 3 tablets per day    Lisinopril 10 Mg Tabs (Lisinopril) ..... Oine tab by mouth once daily  BP today: 100/68 Prior BP: 110/70 (04/23/2009)  Labs Reviewed: K+: 4.8 (05/04/2009) Creat: : 0.7 (05/04/2009)   Chol: 180 (05/04/2009)   HDL: 71.70 (05/04/2009)   LDL: 93 (05/04/2009)   TG: 75.0 (05/04/2009)  Complete Medication List: 1)  Buspirone Hcl 7.5 Mg Tabs (Buspirone hcl) .... Take one by mouth three times a day 2)  Lithium Carbonate 300 Mg Caps (Lithium carbonate) .... Take 2 every morning by mouth 3)  Wellbutrin Xl 150 Mg Tb24 (Bupropion hcl) .... Take 2 by mouth every morning 4)  Geodon 80 Mg Caps (Ziprasidone hcl) .... Take 3 toblets at bedtime with food 5)  Alprazolam 0.25 Mg Tabs (Alprazolam) .... Take one by mouth two times a day as needed 6)  Zyrtec Allergy 10 Mg Tabs (Cetirizine hcl) .... Take 1 tablet by mouth once a day 7)  Propranolol Hcl 10 Mg Tabs (Propranolol hcl) .... Take 3 tablets per day 8)  Lisinopril 10 Mg Tabs (Lisinopril) .... Oine tab by mouth once daily 9)  Promethazine Hcl 25 Mg Tabs (Promethazine hcl) .... One tab by mouth every 6 hrs as needed for nausea. 10)  Flonase 50 Mcg/act Susp (Fluticasone propionate) .... 2 sprays in each nostril once daily  PAP Screening:    Last PAP smear:  02/16/2009  PAP Smear Results:    Date of Exam:  02/16/2009    Results:  Normal, Satisfactory  Next PAP Due:    02/16/2010  Mammogram Screening:    Last  Mammogram:  02/16/2009  Mammogram Results:    Date of Exam:  02/16/2009    Results:  Normal Bilateral  Next Mammogram Due:    02/16/2010  Osteoporosis Risk Assessment:  Risk Factors for Fracture or Low Bone Density:   Race (White or Asian):     yes   Smoking status:       quit  Patient Instructions: 1)  RTC one year, sooner as needed.  Current Allergies (reviewed today): ! MINOCIN (MINOCYCLINE HCL) ! E.E.S. 400 (ERYTHROMYCIN ETHYLSUCCINATE) * MELLARIL (THIORIDAZINE HCL) HYDROCODONE-HOMATROPINE (HYDROCODONE-HOMATROPINE)

## 2010-04-07 ENCOUNTER — Encounter: Payer: Self-pay | Admitting: Family Medicine

## 2010-04-08 ENCOUNTER — Ambulatory Visit (INDEPENDENT_AMBULATORY_CARE_PROVIDER_SITE_OTHER): Payer: 59 | Admitting: Family Medicine

## 2010-04-08 ENCOUNTER — Encounter: Payer: Self-pay | Admitting: Family Medicine

## 2010-04-08 VITALS — BP 110/70 | HR 84 | Temp 98.9°F | Ht 62.0 in | Wt 156.2 lb

## 2010-04-08 DIAGNOSIS — H6690 Otitis media, unspecified, unspecified ear: Secondary | ICD-10-CM

## 2010-04-08 DIAGNOSIS — H6120 Impacted cerumen, unspecified ear: Secondary | ICD-10-CM

## 2010-04-08 MED ORDER — AMOXICILLIN 500 MG PO CAPS: ORAL_CAPSULE | ORAL | Status: DC

## 2010-04-08 NOTE — Progress Notes (Signed)
  Subjective:     Katelyn Price is a 50 y.o. female who presents with ear pain and possible ear infection. Symptoms include: right ear pain. Onset of symptoms was 5 days ago, and have been unchanged since that time. Associated symptoms include: congestion.  Patient denies: chills, coryza, fever , headache, low grade fever, non productive cough and productive cough. She is drinking plenty of fluids.    Review of Systems Pertinent items are noted in HPI.   Objective:    BP 110/70  Pulse 84  Temp(Src) 98.9 F (37.2 C) (Oral)  Ht 5\' 2"  (1.575 m)  Wt 156 lb 4 oz (70.875 kg)  BMI 28.58 kg/m2  LMP 03/07/2010 General:  alert, cooperative and appears stated age  Right Ear: diminished mobility, cerumen occluding deep. Mild erythema of TM and proximal canal next to TM  No ear mobility pain.  Left Ear: ceruminous, occluded...irrigated TM nml behind, no mobility pain  Mouth:  normal findings: lips normal without lesions and buccal mucosa normal  Neck: no adenopathy, no carotid bruit, no JVD, supple, symmetrical, trachea midline and thyroid not enlarged, symmetric, no tenderness/mass/nodules     Assessment:    Right acute otitis media with cerumen impaction bilaterally.  Plan:   Take Amox as directed Avoid Qtips RTC 3 weeks for recheck.

## 2010-04-08 NOTE — Patient Instructions (Signed)
RTC 3 weeks for recheck, reassess cerumen in right ear.

## 2010-05-19 ENCOUNTER — Ambulatory Visit: Payer: 59 | Admitting: Family Medicine

## 2010-05-27 ENCOUNTER — Ambulatory Visit (INDEPENDENT_AMBULATORY_CARE_PROVIDER_SITE_OTHER): Payer: 59 | Admitting: Family Medicine

## 2010-05-27 ENCOUNTER — Encounter: Payer: Self-pay | Admitting: Family Medicine

## 2010-05-27 DIAGNOSIS — L608 Other nail disorders: Secondary | ICD-10-CM

## 2010-05-27 DIAGNOSIS — L602 Onychogryphosis: Secondary | ICD-10-CM | POA: Insufficient documentation

## 2010-05-27 DIAGNOSIS — H6120 Impacted cerumen, unspecified ear: Secondary | ICD-10-CM

## 2010-05-27 DIAGNOSIS — I10 Essential (primary) hypertension: Secondary | ICD-10-CM

## 2010-05-27 NOTE — Assessment & Plan Note (Signed)
Right impacted but cleared. All nml o/w.

## 2010-05-27 NOTE — Progress Notes (Signed)
  Subjective:    Patient ID: Katelyn Price, female    DOB: 12-Sep-1960, 50 y.o.   MRN: [id number]  HPI Pt here for followup of ear congestion and infection. She still has some itchiness of the right ear as well as some muffling.  She sees Dr Jennette Kettle for Gyn, no abnmls recently. She had cryo surg long time ago.  She has both great nails involved with fungus with brittleness and flaking. She has been using an OTC product that has shown some clearing.    Review of Systems  Constitutional: Negative for fever, chills, diaphoresis, activity change, appetite change, fatigue and unexpected weight change.  HENT: Negative for hearing loss, ear pain, nosebleeds, rhinorrhea, trouble swallowing, tinnitus and ear discharge.        Mild hearing difficulty.  Eyes: Negative for pain, discharge, redness and visual disturbance.       Needs eye exam.  Respiratory: Negative for cough, chest tightness, shortness of breath and wheezing.   Cardiovascular: Negative for chest pain, palpitations and leg swelling.  Gastrointestinal: Negative for nausea, vomiting, abdominal pain, diarrhea, constipation, blood in stool and abdominal distention.       She takes Miralax regularly for constipation.  Genitourinary: Negative for dysuria and frequency.  Musculoskeletal: Negative for myalgias, back pain and arthralgias.  Skin: Negative for rash.  Neurological: Negative for dizziness, syncope and numbness. Tremors: mild and chronic.  Hematological: Negative for adenopathy. Does not bruise/bleed easily.  Psychiatric/Behavioral: Positive for agitation (treated by psych.). Negative for hallucinations. Dysphoric mood: treated by psych. The patient is not nervous/anxious.        Objective:   Physical Exam  Constitutional: She appears well-developed and well-nourished. No distress.  HENT:  Head: Normocephalic and atraumatic.  Right Ear: External ear normal.  Left Ear: External ear normal.  Nose: Nose normal.  Mouth/Throat:  Oropharynx is clear and moist. No oropharyngeal exudate.       Right ear canal initially occluded. Was irrigated clear and canal/TM nml behind.  Eyes: Conjunctivae and EOM are normal. Pupils are equal, round, and reactive to light.  Neck: Normal range of motion. Neck supple. No thyromegaly present.  Cardiovascular: Normal rate, regular rhythm and normal heart sounds.   Pulmonary/Chest: Effort normal and breath sounds normal. She has no wheezes. She has no rales.  Lymphadenopathy:    She has no cervical adenopathy.  Skin: She is not diaphoretic.          Assessment & Plan:

## 2010-05-27 NOTE — Assessment & Plan Note (Signed)
Great control. Cont  BP Readings from Last 3 Encounters:  05/27/10 110/70  04/08/10 110/70  05/06/09 100/68

## 2010-05-27 NOTE — Assessment & Plan Note (Signed)
Discussed trmt and need for culture. Suggested application of Vicks to nail bid for as long as a year or more.

## 2010-05-28 NOTE — Op Note (Signed)
[name], [name]                          ACCOUNT NO.:  [id number]   MEDICAL RECORD NO.:  [id number]                   PATIENT TYPE:  AMB   LOCATION:  DAY                                  FACILITY:  [facility]   PHYSICIAN:  Thornton Park. Daphine Deutscher, M.D.             DATE OF BIRTH:  [birth year]   DATE OF PROCEDURE:  08/28/2002  DATE OF DISCHARGE:                                 OPERATIVE REPORT   [id number].   PREOPERATIVE DIAGNOSES:  1. Intractable gastroesophageal reflux disease.  2. Hiatal hernia.   POSTOPERATIVE DIAGNOSES:  1. Intractable gastroesophageal reflux disease.  2. Hiatal hernia.   OPERATION/PROCEDURE:  Laparoscopic Nissen fundoplication over a #50 lighted  bougie (three-suture wrap with a two-suture closure of the hiatus (closed).   SURGEON:  Thornton Park. Daphine Deutscher, M.D.   ASSISTANT:  Currie Paris, M.D.   ANESTHESIA:  General endotracheal anesthesia.   DESCRIPTION OF PROCEDURE:  Katelyn Price is a 50 year old lady who is taken to  Room #1.  We had her upper GI in the room and preoperatively we talked to  her again in the holding area and further amplified our informed consent  regarding the nature of the procedure.  After general anesthesia was  administered, the abdomen was prepped with Betadine and draped sterilely.  Using the Optiview device, I went in with a 10/11 above the umbilicus and  insufflated the abdomen and then placed a 5 mm in the upper midline for  initially the Nathenson retractor and then subsequently I used the  articulated retractor.  Two 10/11's were placed on the side and one in the  left upper quadrant.  First I took down the gastrohepatic omentum,  identified the right crus.  I dissected along that and entered the  retroesophageal space. I carried my dissection anteriorly and took down the  abdominal peritoneal reflection at the EG junction.   Next, we grasp the greater curvature and took down short gastrics up to the  left crus and then  dissected the left crus.   I then returned to the other side and created a very generous window and  further delineated the left crus and the crus.  I had plenty of stomach  which I could pull around for a wrap and it was mobile. The hiatus was  closed with sutures of using the Endostitch and Surgitek, tying these  extracorporeally.  I then brought the stomach around and had a very mobile  contiguous segment of the cardiofundus of the stomach for the wrap.  A 50  lighted bougie was passed by Dr. Rica Mast and we used that to calibrate the  places on the stomach to suture.  The bougie was withdrawn.   The wrap was then constructed at the previously measured sites with three  sutures of using the Endostitch device with the extracorporeally suturing  technique, taking purchases of both of the stomach, distal esophagus and  the  wrapped stomach.  These were tied down sequentially, each having a purchase  of the esophagus.  The wrap was above the esophagogastric junction by our  inspection and looked to be in good position and nicely wrapped without  compromise.  After these were tied, there were clips placed on the knots for  further identification.  The area was irrigated.  There was no evidence of  any bleeding or other bowel injury.  Following the inspection, we looked at  the port size.  They all looked good.  We then decompressed the abdomen as  we withdrew the trocars.  The patient seemed to tolerate this procedure well  and prior to removal from the room, all the wounds were injected with 0.5%  Marcaine and were closed with 4-0 Vicryl subcutaneously and with staples.  The patient tolerated the procedure well and was taken to the recovery room  in satisfactory condition.                                                Thornton Park Daphine Deutscher, M.D.    MBM/MEDQ  D:  08/28/2002  T:  08/28/2002  Job:  [id number]   cc:   Laurita Quint, M.D.  [address] [place]  [place] [address]   Fax: [phone]   Vania Rea. Jarold Motto, M.D. [facility]  [address]  [place]  [place] [address]  Fax: [phone]

## 2010-05-28 NOTE — Op Note (Signed)
   Katelyn Price, Katelyn Price                          ACCOUNT NO.:  [id number]   MEDICAL RECORD NO.:  [id number]                   PATIENT TYPE:  AMB   LOCATION:  ENDO                                 FACILITY:  [facility]   PHYSICIAN:  Vania Rea. Jarold Motto, M.D. [facility]        DATE OF BIRTH:  May 24, 1960   DATE OF PROCEDURE:  05/21/2002  DATE OF DISCHARGE:  05/21/2002                                 OPERATIVE REPORT   ESOPHAGEAL MANOMETER REPORT:  Esophageal manometer was completed on  05/21/2002.  The results are as follows:  1. Upper esophageal sphincter - there appears to be good coordination     between pharyngeal contraction and cricopharyngeal relaxation.  2. Lower esophageal sphincter - lower esophageal sphincter pressure appears     to decrease at approximately 12 to 13 mmHg.  There is normal relaxation     to swallowing.  3. Motility pattern - there are normally propagated peristaltic waves of     normal amplitude and duration throughout the length of the esophagus to     wet and dry swallows.  Mean amplitude of contractions is 130 mmHg.   ASSESSMENT:  This is a normal esophageal manometer except for borderline  incompetent lower esophageal sphincter.  There is no evidence of an  esophageal motility disorder.                                               Vania Rea. Jarold Motto, M.D. [facility]    DRP/MEDQ  D:  05/27/2002  T:  05/27/2002  Job:  [id number]

## 2010-06-14 ENCOUNTER — Other Ambulatory Visit (INDEPENDENT_AMBULATORY_CARE_PROVIDER_SITE_OTHER): Payer: 59 | Admitting: Family Medicine

## 2010-06-14 DIAGNOSIS — Z79899 Other long term (current) drug therapy: Secondary | ICD-10-CM

## 2010-06-14 DIAGNOSIS — E559 Vitamin D deficiency, unspecified: Secondary | ICD-10-CM

## 2010-06-14 DIAGNOSIS — E7800 Pure hypercholesterolemia, unspecified: Secondary | ICD-10-CM

## 2010-06-14 LAB — CBC WITH DIFFERENTIAL/PLATELET
Basophils Relative: 0.7 % (ref 0.0–3.0)
Eosinophils Absolute: 0.2 10*3/uL (ref 0.0–0.7)
Lymphs Abs: 1.8 10*3/uL (ref 0.7–4.0)
MCHC: 33.3 g/dL (ref 30.0–36.0)
MCV: 96.2 fl (ref 78.0–100.0)
Monocytes Absolute: 0.9 10*3/uL (ref 0.1–1.0)
Neutrophils Relative %: 74.6 % (ref 43.0–77.0)
RBC: 4.2 Mil/uL (ref 3.87–5.11)

## 2010-06-14 LAB — LIPID PANEL
Total CHOL/HDL Ratio: 2
Triglycerides: 96 mg/dL (ref 0.0–149.0)

## 2010-06-14 LAB — T4, FREE: Free T4: 0.85 ng/dL (ref 0.60–1.60)

## 2010-06-14 LAB — HEPATIC FUNCTION PANEL
AST: 18 U/L (ref 0–37)
Albumin: 4.5 g/dL (ref 3.5–5.2)
Alkaline Phosphatase: 53 U/L (ref 39–117)
Bilirubin, Direct: 0.1 mg/dL (ref 0.0–0.3)
Total Bilirubin: 0.6 mg/dL (ref 0.3–1.2)

## 2010-06-14 LAB — BASIC METABOLIC PANEL
BUN: 14 mg/dL (ref 6–23)
CO2: 27 mEq/L (ref 19–32)
Chloride: 102 mEq/L (ref 96–112)
Creatinine, Ser: 0.9 mg/dL (ref 0.4–1.2)

## 2010-06-15 LAB — LITHIUM LEVEL: Lithium Lvl: 0.92 mEq/L (ref 0.80–1.40)

## 2010-06-15 LAB — VITAMIN D 25 HYDROXY (VIT D DEFICIENCY, FRACTURES): Vit D, 25-Hydroxy: 74 ng/mL (ref 30–89)

## 2010-06-17 ENCOUNTER — Ambulatory Visit (INDEPENDENT_AMBULATORY_CARE_PROVIDER_SITE_OTHER): Payer: 59 | Admitting: Family Medicine

## 2010-06-17 ENCOUNTER — Encounter: Payer: Self-pay | Admitting: Family Medicine

## 2010-06-17 DIAGNOSIS — H6120 Impacted cerumen, unspecified ear: Secondary | ICD-10-CM

## 2010-06-17 DIAGNOSIS — E7800 Pure hypercholesterolemia, unspecified: Secondary | ICD-10-CM

## 2010-06-17 DIAGNOSIS — D72829 Elevated white blood cell count, unspecified: Secondary | ICD-10-CM

## 2010-06-17 DIAGNOSIS — I10 Essential (primary) hypertension: Secondary | ICD-10-CM

## 2010-06-17 DIAGNOSIS — R7301 Impaired fasting glucose: Secondary | ICD-10-CM

## 2010-06-17 DIAGNOSIS — L608 Other nail disorders: Secondary | ICD-10-CM

## 2010-06-17 DIAGNOSIS — F319 Bipolar disorder, unspecified: Secondary | ICD-10-CM

## 2010-06-17 DIAGNOSIS — L602 Onychogryphosis: Secondary | ICD-10-CM

## 2010-06-17 MED ORDER — LISINOPRIL 10 MG PO TABS: 10.0000 mg | ORAL_TABLET | Freq: Every day | ORAL | Status: DC

## 2010-06-17 NOTE — Assessment & Plan Note (Signed)
Well-controlled on current meds 

## 2010-06-17 NOTE — Progress Notes (Signed)
  Subjective:    Patient ID: Katelyn Price, female    DOB: 1960-05-18, 50 y.o.   MRN: [id number]  HPI Pt here for Comp Exam She sees a Gyn and gets care throught his including Mammos. She was seen in Feb for Pap, DEXA and Mammo. Her ears are doing well. She has no complaints and feels well.    Review of Systems  Constitutional: Negative for fever, chills, diaphoresis, fatigue and unexpected weight change.  HENT: Negative for hearing loss, ear pain, rhinorrhea, trouble swallowing and tinnitus.   Eyes: Negative for pain, discharge and visual disturbance.       Wears glasses.  Respiratory: Negative for cough, shortness of breath and wheezing.   Cardiovascular: Negative for chest pain, palpitations and leg swelling.       No Fainting or Fatigue.  Gastrointestinal: Negative for nausea, vomiting, abdominal pain, diarrhea, constipation and blood in stool.       No Heartburn because she takes Maalox every night.  Genitourinary: Negative for dysuria and frequency.  Musculoskeletal: Negative for myalgias, back pain and arthralgias.  Skin: Negative for rash.       No Itching or Dryness.  Neurological: Negative for tremors and numbness.       No Tingling. No Balance Problems.  Hematological: Negative for adenopathy. Does not bruise/bleed easily.  Psychiatric/Behavioral: Negative for dysphoric mood and agitation.       Objective:   Physical Exam  Constitutional: She is oriented to person, place, and time. She appears well-developed and well-nourished. No distress.  HENT:  Head: Normocephalic and atraumatic.  Left Ear: External ear normal.  Nose: Nose normal.  Mouth/Throat: Oropharynx is clear and moist. No oropharyngeal exudate.  Eyes: Conjunctivae and EOM are normal. Pupils are equal, round, and reactive to light. No scleral icterus.  Neck: Normal range of motion. Neck supple. No thyromegaly present.  Cardiovascular: Normal rate, regular rhythm and normal heart sounds.  Exam reveals no  friction rub.   No murmur heard. Pulmonary/Chest: Effort normal and breath sounds normal. No respiratory distress. She has no wheezes. She has no rales.  Abdominal: Soft. Bowel sounds are normal. She exhibits no mass. There is no tenderness.  Musculoskeletal: Normal range of motion. She exhibits no edema and no tenderness.  Lymphadenopathy:    She has no cervical adenopathy.  Neurological: She is alert and oriented to person, place, and time. She has normal reflexes.       Slight benign tremor.  Skin: Skin is warm and dry. No rash noted. She is not diaphoretic. No erythema.  Psychiatric: She has a normal mood and affect. Her behavior is normal. Judgment and thought content normal.          Assessment & Plan:  HMPE

## 2010-06-17 NOTE — Patient Instructions (Signed)
Recheck CBC in one month. Will report on phone tree.

## 2010-06-17 NOTE — Assessment & Plan Note (Signed)
Already appear half grown out. Cont Vicks.

## 2010-06-17 NOTE — Assessment & Plan Note (Signed)
Higher today. Needs more care with food choices.Marland KitchenMarland KitchenThe patient is advised to reduce or avoid sweets. FBS 126 with candy the last few days.

## 2010-06-17 NOTE — Assessment & Plan Note (Signed)
Will recheck in one month. Poss SE of psych meds.

## 2010-06-17 NOTE — Assessment & Plan Note (Signed)
Good nos. Cont diet.

## 2010-06-17 NOTE — Assessment & Plan Note (Signed)
Ear canals clear today. Has stopped using Qtips.

## 2010-06-17 NOTE — Assessment & Plan Note (Signed)
Well controlled. Cont curr meds. BP Readings from Last 3 Encounters:  06/17/10 110/68  05/27/10 110/70  04/08/10 110/70

## 2010-07-23 ENCOUNTER — Other Ambulatory Visit (INDEPENDENT_AMBULATORY_CARE_PROVIDER_SITE_OTHER): Payer: 59 | Admitting: Family Medicine

## 2010-07-23 DIAGNOSIS — D72829 Elevated white blood cell count, unspecified: Secondary | ICD-10-CM

## 2010-07-23 LAB — CBC WITH DIFFERENTIAL/PLATELET
Basophils Absolute: 0.1 10*3/uL (ref 0.0–0.1)
Eosinophils Absolute: 0 10*3/uL (ref 0.0–0.7)
HCT: 38.3 % (ref 36.0–46.0)
Hemoglobin: 12.8 g/dL (ref 12.0–15.0)
Lymphs Abs: 2 10*3/uL (ref 0.7–4.0)
MCHC: 33.5 g/dL (ref 30.0–36.0)
MCV: 98 fl (ref 78.0–100.0)
Monocytes Absolute: 0.7 10*3/uL (ref 0.1–1.0)
Neutro Abs: 5.8 10*3/uL (ref 1.4–7.7)
RDW: 14.4 % (ref 11.5–14.6)

## 2011-06-11 ENCOUNTER — Other Ambulatory Visit: Payer: Self-pay | Admitting: Family Medicine

## 2011-06-11 DIAGNOSIS — R7301 Impaired fasting glucose: Secondary | ICD-10-CM

## 2011-06-11 DIAGNOSIS — I10 Essential (primary) hypertension: Secondary | ICD-10-CM

## 2011-06-11 DIAGNOSIS — E7800 Pure hypercholesterolemia, unspecified: Secondary | ICD-10-CM

## 2011-06-11 DIAGNOSIS — F319 Bipolar disorder, unspecified: Secondary | ICD-10-CM

## 2011-06-16 ENCOUNTER — Other Ambulatory Visit (INDEPENDENT_AMBULATORY_CARE_PROVIDER_SITE_OTHER): Payer: 59

## 2011-06-16 DIAGNOSIS — R7301 Impaired fasting glucose: Secondary | ICD-10-CM

## 2011-06-16 DIAGNOSIS — F319 Bipolar disorder, unspecified: Secondary | ICD-10-CM

## 2011-06-16 DIAGNOSIS — E7800 Pure hypercholesterolemia, unspecified: Secondary | ICD-10-CM

## 2011-06-16 DIAGNOSIS — I10 Essential (primary) hypertension: Secondary | ICD-10-CM

## 2011-06-16 LAB — LIPID PANEL
LDL Cholesterol: 72 mg/dL (ref 0–99)
Total CHOL/HDL Ratio: 2
VLDL: 18.6 mg/dL (ref 0.0–40.0)

## 2011-06-16 LAB — COMPREHENSIVE METABOLIC PANEL
ALT: 13 U/L (ref 0–35)
AST: 18 U/L (ref 0–37)
Albumin: 4.2 g/dL (ref 3.5–5.2)
Alkaline Phosphatase: 46 U/L (ref 39–117)
BUN: 12 mg/dL (ref 6–23)
Calcium: 9.8 mg/dL (ref 8.4–10.5)
Chloride: 105 mEq/L (ref 96–112)
Potassium: 3.9 mEq/L (ref 3.5–5.1)
Sodium: 138 mEq/L (ref 135–145)

## 2011-06-16 LAB — CBC WITH DIFFERENTIAL/PLATELET
Basophils Relative: 0.8 % (ref 0.0–3.0)
Eosinophils Absolute: 0.1 10*3/uL (ref 0.0–0.7)
Eosinophils Relative: 1.3 % (ref 0.0–5.0)
HCT: 39.2 % (ref 36.0–46.0)
Lymphs Abs: 1.9 10*3/uL (ref 0.7–4.0)
MCHC: 33.3 g/dL (ref 30.0–36.0)
MCV: 97.1 fl (ref 78.0–100.0)
Monocytes Absolute: 0.9 10*3/uL (ref 0.1–1.0)
Platelets: 277 10*3/uL (ref 150.0–400.0)
WBC: 9.6 10*3/uL (ref 4.5–10.5)

## 2011-06-16 LAB — HEMOGLOBIN A1C: Hgb A1c MFr Bld: 4.9 % (ref 4.6–6.5)

## 2011-06-20 ENCOUNTER — Telehealth: Payer: Self-pay | Admitting: *Deleted

## 2011-06-20 ENCOUNTER — Encounter: Payer: Self-pay | Admitting: *Deleted

## 2011-06-20 NOTE — Telephone Encounter (Signed)
Labs faxed as requested by patient.

## 2011-06-20 NOTE — Telephone Encounter (Signed)
Message copied by Sabino Donovan on Mon Jun 20, 2011  9:56 AM ------      Message from: Baldomero Lamy      Created: Thu Jun 16, 2011  8:48 AM      Regarding: Fax lab results       Please fax a copy of (todays) pt's lab results to Dr Johnell Comings @ [phone], that Dr ordered labs and we are not able to draw labs from a non [address], but majority of the labs were duplicates, except for a urinalysis, and a vitamin D level which were not drawn.

## 2011-06-22 ENCOUNTER — Encounter: Payer: Self-pay | Admitting: Family Medicine

## 2011-06-23 ENCOUNTER — Encounter: Payer: Self-pay | Admitting: Family Medicine

## 2011-06-23 ENCOUNTER — Ambulatory Visit (INDEPENDENT_AMBULATORY_CARE_PROVIDER_SITE_OTHER): Payer: 59 | Admitting: Family Medicine

## 2011-06-23 VITALS — BP 116/80 | HR 72 | Temp 98.5°F | Ht 61.5 in | Wt 151.2 lb

## 2011-06-23 DIAGNOSIS — Z0000 Encounter for general adult medical examination without abnormal findings: Secondary | ICD-10-CM | POA: Insufficient documentation

## 2011-06-23 DIAGNOSIS — I10 Essential (primary) hypertension: Secondary | ICD-10-CM

## 2011-06-23 DIAGNOSIS — F319 Bipolar disorder, unspecified: Secondary | ICD-10-CM

## 2011-06-23 DIAGNOSIS — E7800 Pure hypercholesterolemia, unspecified: Secondary | ICD-10-CM

## 2011-06-23 DIAGNOSIS — Z23 Encounter for immunization: Secondary | ICD-10-CM

## 2011-06-23 DIAGNOSIS — R7301 Impaired fasting glucose: Secondary | ICD-10-CM

## 2011-06-23 MED ORDER — LISINOPRIL 10 MG PO TABS: 10.0000 mg | ORAL_TABLET | Freq: Every day | ORAL | Status: DC

## 2011-06-23 NOTE — Progress Notes (Signed)
Subjective:    Patient ID: Katelyn Price, female    DOB: [birth year], 51 y.o.   MRN: [id number]  HPI CC: annual exam  No questions or concerns.  Requests refill of lisinopril.    Bipolar - sees Dr. Misty Stanley.  Preventative: Well woman with OBGYN (Dr. Jennette Kettle), normal mammogram and pap smear. colonoscopy 2004.  Normal.  Has had yearly stool cards by OBGYN and normal. Tetanus - unsure.  Would like today.   Flu shot - 2012.  Caffeine: drinks diet drinks Lives with husband and 1 dog, 1 cat Occupation: housewife Activity: no regular exercise Diet: seldom water, fruits/vegetables seldom  Medications and allergies reviewed and updated in chart.  Past histories reviewed and updated if relevant as below. Patient Active Problem List  Diagnosis  . HYPERCHOLESTEROLEMIA, PURE  . MANIC DEPRESSIVE ILLNESS  . CERUMEN IMPACTION, BILATERAL  . HYPERTENSION, BENIGN ESSENTIAL  . ALLERGIC RHINITIS, SEASONAL  . ALLERGIC RHINITIS  . GERD VIA EGD  . HIATAL HERNIA  . CONSTIPATION  . PSORIASIS NEC  . TREMOR  . IMPAIRED FASTING GLUCOSE  . Hypertrophic toenail  . Leukocytosis   Past Medical History  Diagnosis Date  . Allergic rhinitis   . GERD (gastroesophageal reflux disease)     03/18/02, EGD, [facility] Gerd,gastritis, dilated  . Hypertension   . Acne   . History of MRI of brain and brain stem 06/01/03    wnl  . Diaphragmatic hernia without mention of obstruction or gangrene   . Pure hypercholesterolemia   . Bipolar disorder, unspecified     psych - Dr. Misty Stanley  . Other psoriasis   . Unspecified sinusitis (chronic)   . Abnormal involuntary movements    Past Surgical History  Procedure Date  . Esophagogastroduodenoscopy 1/99    Jarold Motto; HH,GERD  . Colonoscopy 03/18/02    internal hemorrhoids  . Esophagogastroduodenoscopy 03/18/02    HH,GERD,Gastritis; Dilated  . Laparoscopic esophagogastric fundoplasty 08/28/02    Daphine Deutscher   History  Substance Use Topics  . Smoking status: Former Smoker --  1.0 packs/day for 20 years    Types: Cigarettes    Quit date: 01/10/1998  . Smokeless tobacco: Never Used  . Alcohol Use: No   Family History  Problem Relation Age of Onset  . Arthritis Mother     fibromyalgia  . Alcohol abuse Father   . Emphysema Brother     smoker  . COPD Brother     smoker, continues.  . Arthritis Sister     fibromyalgia  . Cancer Neg Hx   . Coronary artery disease Neg Hx   . Stroke Neg Hx   . Diabetes Neg Hx    Allergies  Allergen Reactions  . Hydrocodone-Homatropine     REACTION: unspecified  . Minocycline Hcl     REACTION: Itching   Current Outpatient Prescriptions on File Prior to Visit  Medication Sig Dispense Refill  . ALPRAZolam (XANAX) 0.25 MG tablet Take one by mouth two times a day as needed       . buPROPion (WELLBUTRIN XL) 150 MG 24 hr tablet Take 150 mg by mouth 2 (two) times daily. Take 2 tablets by mouth every morning      . busPIRone (BUSPAR) 15 MG tablet Take 1 1/2 by mouth as directed      . Cholecalciferol (VITAMIN D) 2000 UNITS CAPS Take by mouth 2 (two) times daily.        . citalopram (CELEXA) 10 MG tablet Take 10 mg by mouth daily.      Marland Kitchen  fluticasone (FLONASE) 50 MCG/ACT nasal spray 2 sprays by Nasal route daily.        Marland Kitchen lithium 300 MG capsule 2 (two) times daily with a meal. Take 2 by mouth every morning      . propranolol (INDERAL) 20 MG tablet Take 20 mg by mouth 3 (three) times daily.       . ziprasidone (GEODON) 80 MG capsule Take 80 mg by mouth 3 (three) times daily. Take 3 tablets at bedtime with food      . DISCONTD: lisinopril (PRINIVIL,ZESTRIL) 10 MG tablet Take 1 tablet (10 mg total) by mouth daily.  90 tablet  3  . cetirizine (ZYRTEC ALLERGY) 10 MG tablet Take 10 mg by mouth daily.        . trihexyphenidyl (ARTANE) 2 MG tablet Take 2 mg by mouth 2 (two) times daily with a meal.           Review of Systems  Constitutional: Negative for fever, chills, activity change, appetite change, fatigue and unexpected weight  change.  HENT: Negative for hearing loss and neck pain.   Eyes: Negative for visual disturbance.  Respiratory: Negative for cough, chest tightness, shortness of breath and wheezing.   Cardiovascular: Negative for chest pain, palpitations and leg swelling.  Gastrointestinal: Negative for nausea, vomiting, abdominal pain, diarrhea, constipation, blood in stool and abdominal distention.  Genitourinary: Negative for hematuria and difficulty urinating.  Musculoskeletal: Negative for myalgias and arthralgias.  Skin: Negative for rash.  Neurological: Negative for dizziness, seizures, syncope and headaches.  Hematological: Does not bruise/bleed easily.  Psychiatric/Behavioral: Negative for dysphoric mood. The patient is not nervous/anxious.        Objective:   Physical Exam  Nursing note and vitals reviewed. Constitutional: She is oriented to person, place, and time. She appears well-developed and well-nourished. No distress.  HENT:  Head: Normocephalic and atraumatic.  Right Ear: External ear normal.  Left Ear: External ear normal.  Nose: Nose normal.  Mouth/Throat: Oropharynx is clear and moist. No oropharyngeal exudate.  Eyes: Conjunctivae and EOM are normal. Pupils are equal, round, and reactive to light. No scleral icterus.  Neck: Normal range of motion. Neck supple. No thyromegaly present.  Cardiovascular: Normal rate, regular rhythm, normal heart sounds and intact distal pulses.   No murmur heard. Pulses:      Radial pulses are 2+ on the right side, and 2+ on the left side.  Pulmonary/Chest: Effort normal and breath sounds normal. No respiratory distress. She has no wheezes. She has no rales.       Breast exam deferred  Abdominal: Soft. Bowel sounds are normal. She exhibits no distension and no mass. There is no tenderness. There is no rebound and no guarding.  Genitourinary:       Pelvic exam deferred (per OBGYN)  Musculoskeletal: Normal range of motion.  Lymphadenopathy:    She  has no cervical adenopathy.  Neurological: She is alert and oriented to person, place, and time.       CN grossly intact, station and gait intact  Skin: Skin is warm and dry. No rash noted.  Psychiatric: She has a normal mood and affect. Her behavior is normal. Judgment and thought content normal.       Assessment & Plan:

## 2011-06-23 NOTE — Assessment & Plan Note (Signed)
Great control off med.  If continued good control, may remove from problem list.

## 2011-06-23 NOTE — Addendum Note (Signed)
Addended by: Josph Macho A on: 06/23/2011 09:28 AM   Modules accepted: Orders

## 2011-06-23 NOTE — Assessment & Plan Note (Signed)
Normal today.  Continue to monitor. Lab Results  Component Value Date   HGBA1C 4.9 06/16/2011

## 2011-06-23 NOTE — Patient Instructions (Signed)
Good to meet you today, call us with questions. Take copy of blood work to Dr. Misty Stanley. Think about starting to walk for regular exercise! Return in 1 year for next physical or as needed.

## 2011-06-23 NOTE — Assessment & Plan Note (Signed)
Followed by Dr. Misty Stanley, psych.

## 2011-06-23 NOTE — Assessment & Plan Note (Signed)
Preventative protocols reviewed and updated unless pt declined. Tdap today. UTD colonoscopy, gets stool cards yearly per OBGYN, utd on well woman exam per OBGYN. Discussed healthy diet and increased regular activity for weight loss.

## 2011-06-23 NOTE — Assessment & Plan Note (Signed)
Chronic. Good control on ACEI, continue med.

## 2011-06-27 ENCOUNTER — Other Ambulatory Visit: Payer: Self-pay | Admitting: *Deleted

## 2011-06-27 ENCOUNTER — Other Ambulatory Visit: Payer: Self-pay | Admitting: Family Medicine

## 2011-06-27 MED ORDER — LISINOPRIL 10 MG PO TABS: 10.0000 mg | ORAL_TABLET | Freq: Every day | ORAL | Status: DC

## 2012-06-19 ENCOUNTER — Other Ambulatory Visit: Payer: Self-pay | Admitting: Family Medicine

## 2012-07-26 ENCOUNTER — Ambulatory Visit (INDEPENDENT_AMBULATORY_CARE_PROVIDER_SITE_OTHER): Payer: 59 | Admitting: Family Medicine

## 2012-07-26 ENCOUNTER — Encounter: Payer: Self-pay | Admitting: Family Medicine

## 2012-07-26 VITALS — BP 126/78 | HR 76 | Temp 98.6°F | Wt 157.2 lb

## 2012-07-26 DIAGNOSIS — R110 Nausea: Secondary | ICD-10-CM | POA: Insufficient documentation

## 2012-07-26 DIAGNOSIS — F319 Bipolar disorder, unspecified: Secondary | ICD-10-CM

## 2012-07-26 DIAGNOSIS — R197 Diarrhea, unspecified: Secondary | ICD-10-CM

## 2012-07-26 LAB — CBC WITH DIFFERENTIAL/PLATELET
Basophils Relative: 0.9 % (ref 0.0–3.0)
Eosinophils Relative: 2.9 % (ref 0.0–5.0)
HCT: 31.6 % — ABNORMAL LOW (ref 36.0–46.0)
Lymphs Abs: 1.6 10*3/uL (ref 0.7–4.0)
Monocytes Relative: 10.6 % (ref 3.0–12.0)
Neutrophils Relative %: 65.3 % (ref 43.0–77.0)
Platelets: 298 10*3/uL (ref 150.0–400.0)
RBC: 3.31 Mil/uL — ABNORMAL LOW (ref 3.87–5.11)
WBC: 8.1 10*3/uL (ref 4.5–10.5)

## 2012-07-26 LAB — COMPREHENSIVE METABOLIC PANEL
Albumin: 4 g/dL (ref 3.5–5.2)
Alkaline Phosphatase: 74 U/L (ref 39–117)
BUN: 21 mg/dL (ref 6–23)
Creatinine, Ser: 1.4 mg/dL — ABNORMAL HIGH (ref 0.4–1.2)
Glucose, Bld: 94 mg/dL (ref 70–99)
Potassium: 5 mEq/L (ref 3.5–5.1)

## 2012-07-26 LAB — TSH: TSH: 3.52 u[IU]/mL (ref 0.35–5.50)

## 2012-07-26 LAB — LIPASE: Lipase: 47 U/L (ref 11.0–59.0)

## 2012-07-26 MED ORDER — PANTOPRAZOLE SODIUM 40 MG PO TBEC: 40.0000 mg | DELAYED_RELEASE_TABLET | Freq: Every day | ORAL | Status: DC

## 2012-07-26 NOTE — Assessment & Plan Note (Signed)
Nausea with some diarrhea - ongoing for last several weeks abd exam benign today.  No red flags.  Possible dyspepsia.   Start protonix 40mg  daily for 3 weeks. H/o fundoplasty - check blood work today (CMP, CBC, lipase). If persistent despite protonix, will refer to GI for further evaluation. Check lithium as well. Pt agrees with plan.

## 2012-07-26 NOTE — Patient Instructions (Signed)
Let's check blood work today including lithium level. Start protonix for possible reflux issues causing these symptoms. Samples provided today. If not better, I may want you to see stomach doctor.

## 2012-07-26 NOTE — Progress Notes (Signed)
  Subjective:    Patient ID: Katelyn Price, female    DOB: 01/28/1960, 52 y.o.   MRN: [id number]  HPI CC: not feeling well  1 mo h/o not feeling well.  Started with migraine, then has progressed to persistent nausea.  Headache improved.  Has had diarrhea for last several days - loose stool.  Some dizziness described as lightheadedness.  Hasn't noted specific foods worsening sxs. H/o GERD but no recent med use.  Endorses heartburn sxs, but not recently worsening.  No vomiting.  No fevers/chills, cough, sneezing, no blood in stool, chest pain.  Denies weight loss, has actually gained weight.  No dysphagia, no early satiety.  No new foods.  Trouble eating 2/2 nausea. No recent traveling. Uses city water. Avoids alcohol, rec drugs.  No sick contacts at home. husband smokes at home.  H/o colonoscopy per pt ~2004 WNL.  Has had yearly stool cards that have been normal per OBGYN. Well woman with OBGYN.  Currently on daily ampicillin for acne. Psych - to see Dr Clayton Lefort this month. .  No recent changes in meds (at least in last 6 mo).  EGD 2004 - HH, GERD, gastritis.  Dilated.  H/o GERD and HH s/p fundoplasty (2004).   Wt Readings from Last 3 Encounters:  07/26/12 157 lb 4 oz (71.328 kg)  06/23/11 151 lb 4 oz (68.607 kg)  06/17/10 149 lb 4 oz (67.699 kg)   Body mass index is 29.23 kg/(m^2).  Past Medical History  Diagnosis Date  . Allergic rhinitis   . GERD (gastroesophageal reflux disease)     03/18/02, EGD, [facility] Gerd,gastritis, dilated  . Hypertension   . Acne   . History of MRI of brain and brain stem 06/01/03    wnl  . Diaphragmatic hernia without mention of obstruction or gangrene   . Pure hypercholesterolemia   . Bipolar disorder, unspecified     psych - Dr. Misty Stanley  . Other psoriasis   . Unspecified sinusitis (chronic)   . Abnormal involuntary movements(781.0)     Past Surgical History  Procedure Laterality Date  . Esophagogastroduodenoscopy  1/99    Jarold Motto;  HH,GERD  . Colonoscopy  03/18/02    internal hemorrhoids  . Esophagogastroduodenoscopy  03/18/02    HH,GERD,Gastritis; Dilated  . Laparoscopic esophagogastric fundoplasty  08/28/02    Daphine Deutscher    Review of Systems Per HPI    Objective:   Physical Exam  Nursing note and vitals reviewed. Constitutional: She appears well-developed and well-nourished. No distress.  HENT:  Mouth/Throat: Oropharynx is clear and moist. No oropharyngeal exudate.  Eyes: Conjunctivae and EOM are normal. Pupils are equal, round, and reactive to light. No scleral icterus.  Neck: Normal range of motion. Neck supple.  Cardiovascular: Normal rate, regular rhythm, normal heart sounds and intact distal pulses.   No murmur heard. Pulmonary/Chest: Effort normal and breath sounds normal. No respiratory distress. She has no wheezes. She has no rales.  Abdominal: Soft. Normal appearance and bowel sounds are normal. She exhibits no distension and no mass. There is no tenderness. There is no rigidity, no rebound, no guarding, no CVA tenderness and negative Murphy's sign.  obese  Musculoskeletal: She exhibits no edema.       Assessment & Plan:

## 2012-07-27 ENCOUNTER — Other Ambulatory Visit: Payer: Self-pay | Admitting: Family Medicine

## 2012-07-27 DIAGNOSIS — N289 Disorder of kidney and ureter, unspecified: Secondary | ICD-10-CM | POA: Insufficient documentation

## 2012-07-27 LAB — LITHIUM LEVEL: Lithium Lvl: 2 mEq/L — ABNORMAL HIGH (ref 0.80–1.40)

## 2012-07-30 ENCOUNTER — Other Ambulatory Visit (INDEPENDENT_AMBULATORY_CARE_PROVIDER_SITE_OTHER): Payer: 59

## 2012-07-30 ENCOUNTER — Encounter: Payer: 59 | Admitting: Family Medicine

## 2012-07-30 DIAGNOSIS — E7800 Pure hypercholesterolemia, unspecified: Secondary | ICD-10-CM

## 2012-07-30 DIAGNOSIS — D72829 Elevated white blood cell count, unspecified: Secondary | ICD-10-CM

## 2012-07-30 DIAGNOSIS — I10 Essential (primary) hypertension: Secondary | ICD-10-CM

## 2012-07-30 DIAGNOSIS — Z0000 Encounter for general adult medical examination without abnormal findings: Secondary | ICD-10-CM

## 2012-07-30 DIAGNOSIS — R7301 Impaired fasting glucose: Secondary | ICD-10-CM

## 2012-07-30 DIAGNOSIS — N289 Disorder of kidney and ureter, unspecified: Secondary | ICD-10-CM

## 2012-07-30 LAB — RENAL FUNCTION PANEL
BUN: 22 mg/dL (ref 6–23)
Creatinine, Ser: 1.4 mg/dL — ABNORMAL HIGH (ref 0.4–1.2)
GFR: 43.12 mL/min — ABNORMAL LOW (ref >60.00)
Glucose, Bld: 98 mg/dL (ref 70–99)
Sodium: 142 mEq/L (ref 135–145)

## 2012-07-30 NOTE — Addendum Note (Signed)
Addended by: Baldomero Lamy on: 07/30/2012 08:52 AM   Modules accepted: Orders

## 2012-08-01 ENCOUNTER — Encounter: Payer: Self-pay | Admitting: Family Medicine

## 2012-08-01 ENCOUNTER — Ambulatory Visit (INDEPENDENT_AMBULATORY_CARE_PROVIDER_SITE_OTHER): Payer: 59 | Admitting: Family Medicine

## 2012-08-01 VITALS — BP 156/92 | HR 78 | Temp 98.4°F | Ht 61.5 in | Wt 156.5 lb

## 2012-08-01 DIAGNOSIS — I10 Essential (primary) hypertension: Secondary | ICD-10-CM

## 2012-08-01 DIAGNOSIS — Z0000 Encounter for general adult medical examination without abnormal findings: Secondary | ICD-10-CM

## 2012-08-01 DIAGNOSIS — D649 Anemia, unspecified: Secondary | ICD-10-CM

## 2012-08-01 DIAGNOSIS — R110 Nausea: Secondary | ICD-10-CM

## 2012-08-01 DIAGNOSIS — N289 Disorder of kidney and ureter, unspecified: Secondary | ICD-10-CM

## 2012-08-01 NOTE — Assessment & Plan Note (Addendum)
Of undetermined duration. Lithium could be etiology of insufficiency, or could be result of this. Regardless, I asked patient to decrease lithium to 300mg  once daily (prior on 600mg  daily). I will also obtain renal ultrasound to further evaluate insufficiency. RTC 1 mo for recheck blood work, sooner if worsening. I will send today's note outlining decreased lithium dose and recent labs to psych to be aware.

## 2012-08-01 NOTE — Assessment & Plan Note (Signed)
Elevated today.  Recheck next visit. BP Readings from Last 3 Encounters:  08/01/12 156/92  07/26/12 126/78  06/23/11 116/80

## 2012-08-01 NOTE — Patient Instructions (Addendum)
Decrease lithium to 1 pill daily (300mg  in the morning) I will send today's note to Dr. Ellis Savage (psychiatrist) to see if she wants to see you sooner. Return in 1 month for lab visit to recheck kidney function. We will decide on colonoscopy at that time.  May recommend scheduling as you're due. Pass by Linda's office to schedule ultasound of abdomen.

## 2012-08-01 NOTE — Progress Notes (Signed)
Subjective:    Patient ID: Katelyn Price, female    DOB: [birth year], 52 y.o.   MRN: [id number]  HPI CC: CPE  Seen here last week with ongoing nausea - found to have renal insufficiency of unclear duration as well as high lithium level with anemia. Lab Results  Component Value Date   CREATININE 1.4* 07/30/2012   Wt Readings from Last 3 Encounters:  08/01/12 156 lb 8 oz (70.988 kg)  07/26/12 157 lb 4 oz (71.328 kg)  06/23/11 151 lb 4 oz (68.607 kg)   BP Readings from Last 3 Encounters:  08/01/12 156/92  07/26/12 126/78  06/23/11 116/80   Persistent tremors. Blood work reviewed in detail.  Next appt with Dr. Misty Stanley is not until 11/2012  Preventative:  Well woman with OBGYN (Dr. Jennette Kettle), normal mammogram and pap smear per patient. Early 2014 Colonoscopy 2004. Normal stool cards in past. Tetanus - 06/2011  Flu shot - did not receive last year  Seat belt use discussed Sunscreen use discussed.  Caffeine: drinks diet drinks  Lives with husband and 1 dog, 1 cat  Occupation: housewife  Activity: no regular exercise  Diet: good water, fruits/vegetables seldom  Medications and allergies reviewed and updated in chart.  Past histories reviewed and updated if relevant as below. Patient Active Problem List   Diagnosis Date Noted  . Renal insufficiency 07/27/2012  . Nausea 07/26/2012  . Healthcare maintenance 06/23/2011  . Leukocytosis 06/17/2010  . Hypertrophic toenail 05/27/2010  . ALLERGIC RHINITIS, SEASONAL 04/10/2009  . TREMOR 07/15/2008  . Impaired fasting glucose 06/07/2007  . PSORIASIS NEC 06/01/2006  . HYPERCHOLESTEROLEMIA, PURE 05/31/2006  . MANIC DEPRESSIVE ILLNESS 05/31/2006  . HYPERTENSION, BENIGN ESSENTIAL 05/31/2006  . ALLERGIC RHINITIS 05/31/2006  . GERD VIA EGD 05/31/2006  . HIATAL HERNIA 05/31/2006   Past Medical History  Diagnosis Date  . Allergic rhinitis   . GERD (gastroesophageal reflux disease)     03/18/02, EGD, [facility]., Gerd,gastritis, dilated  .  Hypertension   . Acne   . History of MRI of brain and brain stem 06/01/03    wnl  . Diaphragmatic hernia without mention of obstruction or gangrene   . Pure hypercholesterolemia   . Bipolar disorder, unspecified     psych - Dr. Misty Stanley  . Other psoriasis   . Unspecified sinusitis (chronic)   . Abnormal involuntary movements(781.0)    Past Surgical History  Procedure Laterality Date  . Esophagogastroduodenoscopy  1/99    Jarold Motto; HH,GERD  . Colonoscopy  03/18/02    internal hemorrhoids  . Esophagogastroduodenoscopy  03/18/02    HH,GERD,Gastritis; Dilated  . Laparoscopic esophagogastric fundoplasty  08/28/02    Daphine Deutscher   History  Substance Use Topics  . Smoking status: Former Smoker -- 1.00 packs/day for 20 years    Types: Cigarettes    Quit date: 01/10/1998  . Smokeless tobacco: Never Used  . Alcohol Use: No   Family History  Problem Relation Age of Onset  . Arthritis Mother     fibromyalgia  . Alcohol abuse Father   . Emphysema Brother     smoker  . COPD Brother     smoker, continues.  . Arthritis Sister     fibromyalgia  . Cancer Neg Hx   . Coronary artery disease Neg Hx   . Stroke Neg Hx   . Diabetes Neg Hx    Allergies  Allergen Reactions  . Hydrocodone-Homatropine     REACTION: unspecified  . Minocycline Hcl     REACTION:  Itching   Current Outpatient Prescriptions on File Prior to Visit  Medication Sig Dispense Refill  . ALPRAZolam (XANAX) 0.25 MG tablet Take one by mouth two times a day as needed       . ampicillin (PRINCIPEN) 500 MG capsule Take 500 mg by mouth daily.      Marland Kitchen buPROPion (WELLBUTRIN XL) 150 MG 24 hr tablet Take 150 mg by mouth 2 (two) times daily. Take 2 tablets by mouth every morning      . busPIRone (BUSPAR) 15 MG tablet Take 7.5 mg by mouth 3 (three) times daily. Take 1/2 by mouth 3 times daily      . cetirizine (ZYRTEC ALLERGY) 10 MG tablet Take 10 mg by mouth daily.        . Cholecalciferol (VITAMIN D) 2000 UNITS CAPS Take by mouth 2  (two) times daily.        . citalopram (CELEXA) 10 MG tablet Take 10 mg by mouth daily.      . fluticasone (FLONASE) 50 MCG/ACT nasal spray 2 sprays by Nasal route daily.        Marland Kitchen lisinopril (PRINIVIL,ZESTRIL) 10 MG tablet TAKE 1 TABLET (10 MG TOTAL) BY MOUTH DAILY.  90 tablet  1  . lithium 300 MG capsule 2 (two) times daily with a meal. Take 2 by mouth every morning      . pantoprazole (PROTONIX) 40 MG tablet Take 1 tablet (40 mg total) by mouth daily.  30 tablet  3  . propranolol (INDERAL) 20 MG tablet Take 20 mg by mouth 3 (three) times daily.       . ziprasidone (GEODON) 80 MG capsule Take 80 mg by mouth 3 (three) times daily. Take 3 tablets at bedtime with food       No current facility-administered medications on file prior to visit.     Review of Systems  Constitutional: Negative for fever, chills, activity change, appetite change, fatigue and unexpected weight change.  HENT: Negative for hearing loss and neck pain.   Eyes: Negative for visual disturbance.  Respiratory: Negative for cough, chest tightness, shortness of breath and wheezing.   Cardiovascular: Negative for chest pain, palpitations and leg swelling.  Gastrointestinal: Positive for nausea and diarrhea. Negative for vomiting, abdominal pain, constipation, blood in stool and abdominal distention.  Genitourinary: Negative for hematuria, difficulty urinating and menstrual problem (no change in flow).  Musculoskeletal: Negative for myalgias and arthralgias.  Skin: Negative for rash.  Neurological: Negative for dizziness, seizures, syncope and headaches.  Hematological: Negative for adenopathy. Does not bruise/bleed easily.  Psychiatric/Behavioral: Negative for dysphoric mood. The patient is not nervous/anxious.        Objective:   Physical Exam  Nursing note and vitals reviewed. Constitutional: She is oriented to person, place, and time. She appears well-developed and well-nourished. No distress.  HENT:  Head:  Normocephalic and atraumatic.  Right Ear: External ear normal.  Left Ear: External ear normal.  Nose: Nose normal.  Mouth/Throat: Oropharynx is clear and moist. No oropharyngeal exudate.  Eyes: Conjunctivae and EOM are normal. Pupils are equal, round, and reactive to light. No scleral icterus.  Neck: Normal range of motion. Neck supple. No thyromegaly present.  Cardiovascular: Normal rate, regular rhythm, normal heart sounds and intact distal pulses.   No murmur heard. Pulses:      Radial pulses are 2+ on the right side, and 2+ on the left side.  Pulmonary/Chest: Effort normal and breath sounds normal. No respiratory distress. She has no wheezes.  She has no rales.  Abdominal: Soft. Bowel sounds are normal. She exhibits no distension and no mass. There is no tenderness. There is no rebound and no guarding.  Musculoskeletal: Normal range of motion.  Lymphadenopathy:    She has no cervical adenopathy.  Neurological: She is alert and oriented to person, place, and time.  CN grossly intact, station and gait intact Baseline tremor  Skin: Skin is warm and dry. No rash noted.  Psychiatric: She has a normal mood and affect. Her behavior is normal. Judgment and thought content normal.       Assessment & Plan:

## 2012-08-01 NOTE — Assessment & Plan Note (Signed)
Preventative protocols reviewed and updated unless pt declined. Discussed healthy diet and lifestyle.  Last colonoscopy 2004.  Will be due for repeat this year.

## 2012-08-01 NOTE — Assessment & Plan Note (Signed)
Persistent.  Blood work revealing renal insufficiency of unknown duration. Recommended abd [place].  See below.

## 2012-08-08 ENCOUNTER — Ambulatory Visit
Admission: RE | Admit: 2012-08-08 | Discharge: 2012-08-08 | Disposition: A | Discharge status: Home / Self Care | Payer: 59 | Source: Ambulatory Visit | Attending: Family Medicine | Admitting: Family Medicine

## 2012-08-08 DIAGNOSIS — N289 Disorder of kidney and ureter, unspecified: Secondary | ICD-10-CM

## 2012-08-08 DIAGNOSIS — R110 Nausea: Secondary | ICD-10-CM

## 2012-09-03 ENCOUNTER — Other Ambulatory Visit (INDEPENDENT_AMBULATORY_CARE_PROVIDER_SITE_OTHER): Payer: 59

## 2012-09-03 DIAGNOSIS — N289 Disorder of kidney and ureter, unspecified: Secondary | ICD-10-CM

## 2012-09-03 DIAGNOSIS — D649 Anemia, unspecified: Secondary | ICD-10-CM

## 2012-09-03 LAB — RENAL FUNCTION PANEL
Albumin: 4.1 g/dL (ref 3.5–5.2)
Chloride: 108 mEq/L (ref 96–112)
Glucose, Bld: 103 mg/dL — ABNORMAL HIGH (ref 70–99)
Phosphorus: 3 mg/dL (ref 2.3–4.6)
Potassium: 5.2 mEq/L — ABNORMAL HIGH (ref 3.5–5.1)
Sodium: 139 mEq/L (ref 135–145)

## 2012-09-03 LAB — VITAMIN B12: Vitamin B-12: 127 pg/mL — ABNORMAL LOW (ref 211–911)

## 2012-09-03 LAB — FERRITIN: Ferritin: 22.7 ng/mL (ref 10.0–291.0)

## 2012-09-03 LAB — FOLATE: Folate: 9.6 ng/mL (ref >5.9)

## 2012-09-04 NOTE — Addendum Note (Signed)
Addended by: Baldomero Lamy on: 09/04/2012 10:22 AM   Modules accepted: Orders

## 2012-09-05 ENCOUNTER — Other Ambulatory Visit: Payer: Self-pay | Admitting: Family Medicine

## 2012-09-05 DIAGNOSIS — E538 Deficiency of other specified B group vitamins: Secondary | ICD-10-CM | POA: Insufficient documentation

## 2012-09-05 LAB — PROTEIN ELECTROPHORESIS, SERUM
Alpha-1-Globulin: 3.8 % (ref 2.9–4.9)
Alpha-2-Globulin: 10.3 % (ref 7.1–11.8)
Beta 2: 4.2 % (ref 3.2–6.5)
Beta Globulin: 8.2 % — ABNORMAL HIGH (ref 4.7–7.2)
Gamma Globulin: 11 % — ABNORMAL LOW (ref 11.1–18.8)

## 2012-09-05 LAB — PROTEIN ELECTROPHORESIS, URINE REFLEX: Total Protein, Urine: <3 mg/dL

## 2012-09-07 ENCOUNTER — Ambulatory Visit (INDEPENDENT_AMBULATORY_CARE_PROVIDER_SITE_OTHER): Payer: 59 | Admitting: *Deleted

## 2012-09-07 DIAGNOSIS — E538 Deficiency of other specified B group vitamins: Secondary | ICD-10-CM

## 2012-09-07 MED ORDER — CYANOCOBALAMIN 1000 MCG/ML IJ SOLN
1000.0000 ug | Freq: Once | INTRAMUSCULAR | Status: AC
  Administered 2012-09-07: 1000 ug via INTRAMUSCULAR

## 2012-10-08 ENCOUNTER — Other Ambulatory Visit: Payer: Self-pay | Admitting: *Deleted

## 2012-10-08 MED ORDER — PANTOPRAZOLE SODIUM 40 MG PO TBEC: 40.0000 mg | DELAYED_RELEASE_TABLET | Freq: Every day | ORAL | Status: DC

## 2012-10-09 ENCOUNTER — Ambulatory Visit (INDEPENDENT_AMBULATORY_CARE_PROVIDER_SITE_OTHER): Payer: 59 | Admitting: *Deleted

## 2012-10-09 DIAGNOSIS — E538 Deficiency of other specified B group vitamins: Secondary | ICD-10-CM

## 2012-10-09 DIAGNOSIS — Z23 Encounter for immunization: Secondary | ICD-10-CM

## 2012-10-09 MED ORDER — CYANOCOBALAMIN 1000 MCG/ML IJ SOLN
1000.0000 ug | Freq: Once | INTRAMUSCULAR | Status: AC
  Administered 2012-10-09: 1000 ug via INTRAMUSCULAR

## 2012-11-13 ENCOUNTER — Ambulatory Visit (INDEPENDENT_AMBULATORY_CARE_PROVIDER_SITE_OTHER): Payer: 59

## 2012-11-13 DIAGNOSIS — E538 Deficiency of other specified B group vitamins: Secondary | ICD-10-CM

## 2012-11-13 MED ORDER — CYANOCOBALAMIN 1000 MCG/ML IJ SOLN
1000.0000 ug | Freq: Once | INTRAMUSCULAR | Status: AC
  Administered 2012-11-13: 1000 ug via INTRAMUSCULAR

## 2012-12-12 ENCOUNTER — Ambulatory Visit (INDEPENDENT_AMBULATORY_CARE_PROVIDER_SITE_OTHER): Payer: 59 | Admitting: *Deleted

## 2012-12-12 DIAGNOSIS — E538 Deficiency of other specified B group vitamins: Secondary | ICD-10-CM

## 2012-12-12 MED ORDER — CYANOCOBALAMIN 1000 MCG/ML IJ SOLN
1000.0000 ug | Freq: Once | INTRAMUSCULAR | Status: AC
  Administered 2012-12-12: 1000 ug via INTRAMUSCULAR

## 2012-12-17 ENCOUNTER — Other Ambulatory Visit: Payer: Self-pay | Admitting: Family Medicine

## 2013-01-16 ENCOUNTER — Ambulatory Visit: Payer: 59

## 2013-07-24 ENCOUNTER — Other Ambulatory Visit: Payer: Self-pay | Admitting: Family Medicine

## 2013-07-24 DIAGNOSIS — E538 Deficiency of other specified B group vitamins: Secondary | ICD-10-CM

## 2013-07-24 DIAGNOSIS — F319 Bipolar disorder, unspecified: Secondary | ICD-10-CM

## 2013-07-24 DIAGNOSIS — I10 Essential (primary) hypertension: Secondary | ICD-10-CM

## 2013-07-24 DIAGNOSIS — E7800 Pure hypercholesterolemia, unspecified: Secondary | ICD-10-CM

## 2013-07-26 ENCOUNTER — Other Ambulatory Visit (INDEPENDENT_AMBULATORY_CARE_PROVIDER_SITE_OTHER): Payer: 59

## 2013-07-26 DIAGNOSIS — F319 Bipolar disorder, unspecified: Secondary | ICD-10-CM

## 2013-07-26 DIAGNOSIS — E538 Deficiency of other specified B group vitamins: Secondary | ICD-10-CM

## 2013-07-26 DIAGNOSIS — I10 Essential (primary) hypertension: Secondary | ICD-10-CM

## 2013-07-26 DIAGNOSIS — E7800 Pure hypercholesterolemia, unspecified: Secondary | ICD-10-CM

## 2013-07-26 LAB — CBC WITH DIFFERENTIAL/PLATELET
BASOS ABS: 0 10*3/uL (ref 0.0–0.1)
Basophils Relative: 0.5 % (ref 0.0–3.0)
Eosinophils Absolute: 0.2 10*3/uL (ref 0.0–0.7)
Eosinophils Relative: 2 % (ref 0.0–5.0)
HEMATOCRIT: 39 % (ref 36.0–46.0)
HEMOGLOBIN: 12.6 g/dL (ref 12.0–15.0)
LYMPHS ABS: 1.9 10*3/uL (ref 0.7–4.0)
Lymphocytes Relative: 21.5 % (ref 12.0–46.0)
MCHC: 32.4 g/dL (ref 30.0–36.0)
MCV: 98.2 fl (ref 78.0–100.0)
Monocytes Absolute: 0.8 10*3/uL (ref 0.1–1.0)
Monocytes Relative: 8.5 % (ref 3.0–12.0)
NEUTROS ABS: 6 10*3/uL (ref 1.4–7.7)
Neutrophils Relative %: 67.5 % (ref 43.0–77.0)
PLATELETS: 300 10*3/uL (ref 150.0–400.0)
RBC: 3.97 Mil/uL (ref 3.87–5.11)
RDW: 13.8 % (ref 11.5–15.5)
WBC: 8.9 10*3/uL (ref 4.0–10.5)

## 2013-07-26 LAB — BASIC METABOLIC PANEL
BUN: 16 mg/dL (ref 6–23)
CALCIUM: 10.6 mg/dL — AB (ref 8.4–10.5)
CO2: 29 mEq/L (ref 19–32)
CREATININE: 0.9 mg/dL (ref 0.4–1.2)
Chloride: 102 mEq/L (ref 96–112)
GFR: 69.75 mL/min (ref >60.00)
Glucose, Bld: 102 mg/dL — ABNORMAL HIGH (ref 70–99)
Potassium: 4.9 mEq/L (ref 3.5–5.1)
Sodium: 137 mEq/L (ref 135–145)

## 2013-07-26 LAB — LIPID PANEL
CHOLESTEROL: 197 mg/dL (ref 0–200)
HDL: 87.3 mg/dL (ref >39.00)
LDL Cholesterol: 87 mg/dL (ref 0–99)
NonHDL: 109.7
TRIGLYCERIDES: 116 mg/dL (ref 0.0–149.0)
Total CHOL/HDL Ratio: 2
VLDL: 23.2 mg/dL (ref 0.0–40.0)

## 2013-07-26 LAB — TSH: TSH: 2.36 u[IU]/mL (ref 0.35–4.50)

## 2013-07-26 LAB — VITAMIN B12: Vitamin B-12: 189 pg/mL — ABNORMAL LOW (ref 211–911)

## 2013-07-28 ENCOUNTER — Encounter: Payer: Self-pay | Admitting: Family Medicine

## 2013-07-28 DIAGNOSIS — T56891A Toxic effect of other metals, accidental (unintentional), initial encounter: Secondary | ICD-10-CM | POA: Insufficient documentation

## 2013-07-29 LAB — LITHIUM LEVEL: LITHIUM LVL: 0.9 meq/L (ref 0.80–1.40)

## 2013-08-02 ENCOUNTER — Encounter (INDEPENDENT_AMBULATORY_CARE_PROVIDER_SITE_OTHER): Payer: Self-pay

## 2013-08-02 ENCOUNTER — Encounter: Payer: 59 | Admitting: Family Medicine

## 2013-08-02 ENCOUNTER — Encounter: Payer: Self-pay | Admitting: Family Medicine

## 2013-08-02 ENCOUNTER — Ambulatory Visit (INDEPENDENT_AMBULATORY_CARE_PROVIDER_SITE_OTHER): Payer: 59 | Admitting: Family Medicine

## 2013-08-02 VITALS — BP 128/80 | HR 53 | Temp 98.1°F | Ht 61.75 in | Wt 166.0 lb

## 2013-08-02 DIAGNOSIS — Z0000 Encounter for general adult medical examination without abnormal findings: Secondary | ICD-10-CM

## 2013-08-02 DIAGNOSIS — I10 Essential (primary) hypertension: Secondary | ICD-10-CM

## 2013-08-02 DIAGNOSIS — Z1211 Encounter for screening for malignant neoplasm of colon: Secondary | ICD-10-CM

## 2013-08-02 DIAGNOSIS — R259 Unspecified abnormal involuntary movements: Secondary | ICD-10-CM

## 2013-08-02 DIAGNOSIS — E538 Deficiency of other specified B group vitamins: Secondary | ICD-10-CM

## 2013-08-02 DIAGNOSIS — E7800 Pure hypercholesterolemia, unspecified: Secondary | ICD-10-CM

## 2013-08-02 DIAGNOSIS — K219 Gastro-esophageal reflux disease without esophagitis: Secondary | ICD-10-CM

## 2013-08-02 DIAGNOSIS — F319 Bipolar disorder, unspecified: Secondary | ICD-10-CM

## 2013-08-02 NOTE — Assessment & Plan Note (Signed)
Restart B12 shots as latest b12 level still too low. Consider checking IF next labwork. Discussed importance of monthly B12 shots

## 2013-08-02 NOTE — Assessment & Plan Note (Signed)
Monitored closely by Dr. Lattie Haw psychiatry. Continue geodon, lithium, celexa, buspar and xanax.

## 2013-08-02 NOTE — Patient Instructions (Signed)
Pass by lab for a stool kit. Start B12 shots again monthly, for next 6 months then we will recheck vitamin B12 levels. First shot today. Good to see you today, call us with questions. Verify your meds you take at home with the list we have given you today. Return as needed or in 1 year for next physical. 

## 2013-08-02 NOTE — Assessment & Plan Note (Signed)
Chronic, stable off meds. Will remove from problem list.

## 2013-08-02 NOTE — Assessment & Plan Note (Signed)
Due to psychoactive med. continue propranolol tid

## 2013-08-02 NOTE — Progress Notes (Signed)
BP 128/80  Pulse 53  Temp(Src) 98.1 F (36.7 C) (Oral)  Ht 5' 1.75" (1.568 m)  Wt 166 lb (75.297 kg)  BMI 30.63 kg/m2  SpO2 99%   CC: CPE  Subjective:    Patient ID: Katelyn Price, female    DOB: 04-08-1960, 53 y.o.   MRN: [id number]  HPI: Katelyn Price is a 53 y.o. female presenting on 08/02/2013 for Annual Exam   Unsure about meds - husband helps administer them.  Preventative: Well woman with OBGYN (Dr. Nori Riis), normal mammogram and pap smear per patient. 03/2013. COLONOSCOPY Date: 03/18/02 internal hemorrhoids Tdap - 06/2011  Flu shot - 09/2012  Seat belt use discussed  Sunscreen use discussed, no changing moles  Caffeine: drinks diet drinks  Lives with husband and 1 dog, 1 cat  Occupation: housewife  Activity: no regular exercise  Diet: good water, fruits/vegetables seldom  Relevant past medical, surgical, family and social history reviewed and updated as indicated.  Allergies and medications reviewed and updated. Current Outpatient Prescriptions on File Prior to Visit  Medication Sig  . ALPRAZolam (XANAX) 0.25 MG tablet Take one by mouth two times a day as needed   . buPROPion (WELLBUTRIN XL) 150 MG 24 hr tablet Take 300 mg by mouth daily.   . busPIRone (BUSPAR) 15 MG tablet Take 7.5 mg by mouth 3 (three) times daily.   . Cholecalciferol (VITAMIN D) 2000 UNITS CAPS Take 1 capsule by mouth daily.   . cyanocobalamin (,VITAMIN B-12,) 1000 MCG/ML injection Inject 1 mL (1,000 mcg total) into the muscle every 30 (thirty) days.  . fluticasone (FLONASE) 50 MCG/ACT nasal spray Place 2 sprays into the nose daily as needed.   Marland Kitchen lisinopril (PRINIVIL,ZESTRIL) 10 MG tablet TAKE 1 TABLET (10 MG TOTAL) BY MOUTH DAILY.  . pantoprazole (PROTONIX) 40 MG tablet Take 1 tablet (40 mg total) by mouth daily.  . propranolol (INDERAL) 20 MG tablet Take 20 mg by mouth 3 (three) times daily.   . ziprasidone (GEODON) 80 MG capsule Take 240 mg by mouth at bedtime.   . citalopram (CELEXA) 10  MG tablet Take 10 mg by mouth daily.   No current facility-administered medications on file prior to visit.    Review of Systems  Constitutional: Negative for fever, chills, activity change, appetite change, fatigue and unexpected weight change.  HENT: Negative for hearing loss.   Eyes: Negative for visual disturbance.  Respiratory: Negative for cough, chest tightness, shortness of breath and wheezing.   Cardiovascular: Negative for chest pain, palpitations and leg swelling.  Gastrointestinal: Negative for nausea, vomiting, abdominal pain, diarrhea, constipation, blood in stool and abdominal distention.  Genitourinary: Negative for hematuria and difficulty urinating.  Musculoskeletal: Negative for arthralgias, myalgias and neck pain.  Skin: Negative for rash.  Neurological: Negative for dizziness, seizures, syncope and headaches.  Hematological: Negative for adenopathy. Does not bruise/bleed easily.  Psychiatric/Behavioral: Positive for dysphoric mood. The patient is nervous/anxious (occaisonal anxiety attack).    Per HPI unless specifically indicated above    Objective:    BP 128/80  Pulse 53  Temp(Src) 98.1 F (36.7 C) (Oral)  Ht 5' 1.75" (1.568 m)  Wt 166 lb (75.297 kg)  BMI 30.63 kg/m2  SpO2 99%  Physical Exam  Nursing note and vitals reviewed. Constitutional: She is oriented to person, place, and time. She appears well-developed and well-nourished. No distress.  HENT:  Head: Normocephalic and atraumatic.  Right Ear: Hearing, tympanic membrane, external ear and ear canal normal.  Left Ear: Hearing, tympanic membrane, external ear and ear canal normal.  Nose: Nose normal.  Mouth/Throat: Uvula is midline, oropharynx is clear and moist and mucous membranes are normal. No oropharyngeal exudate, posterior oropharyngeal edema or posterior oropharyngeal erythema.  Eyes: Conjunctivae and EOM are normal. Pupils are equal, round, and reactive to light. No scleral icterus.  Neck:  Normal range of motion. Neck supple. No thyromegaly present.  Cardiovascular: Normal rate, regular rhythm, normal heart sounds and intact distal pulses.   No murmur heard. Pulses:      Radial pulses are 2+ on the right side, and 2+ on the left side.  Pulmonary/Chest: Effort normal and breath sounds normal. No respiratory distress. She has no wheezes. She has no rales.  Abdominal: Soft. Bowel sounds are normal. She exhibits no distension and no mass. There is no tenderness. There is no rebound and no guarding.  Musculoskeletal: Normal range of motion. She exhibits no edema.  Lymphadenopathy:    She has no cervical adenopathy.  Neurological: She is alert and oriented to person, place, and time.  CN grossly intact, station and gait intact Tremor present  Skin: Skin is warm and dry. No rash noted.  Psychiatric: She has a normal mood and affect. Her behavior is normal. Judgment and thought content normal.   Results for orders placed in visit on 07/26/13  LITHIUM LEVEL      Result Value Ref Range   Lithium Lvl 0.90  0.80 - 1.40 mEq/L  LIPID PANEL      Result Value Ref Range   Cholesterol 197  0 - 200 mg/dL   Triglycerides 116.0  0.0 - 149.0 mg/dL   HDL 87.30  >39.00 mg/dL   VLDL 23.2  0.0 - 40.0 mg/dL   LDL Cholesterol 87  0 - 99 mg/dL   Total CHOL/HDL Ratio 2     NonHDL 109.70    TSH      Result Value Ref Range   TSH 2.36  0.35 - 4.50 uIU/mL  CBC WITH DIFFERENTIAL      Result Value Ref Range   WBC 8.9  4.0 - 10.5 K/uL   RBC 3.97  3.87 - 5.11 Mil/uL   Hemoglobin 12.6  12.0 - 15.0 g/dL   HCT 39.0  36.0 - 46.0 %   MCV 98.2  78.0 - 100.0 fl   MCHC 32.4  30.0 - 36.0 g/dL   RDW 13.8  11.5 - 15.5 %   Platelets 300.0  150.0 - 400.0 K/uL   Neutrophils Relative % 67.5  43.0 - 77.0 %   Lymphocytes Relative 21.5  12.0 - 46.0 %   Monocytes Relative 8.5  3.0 - 12.0 %   Eosinophils Relative 2.0  0.0 - 5.0 %   Basophils Relative 0.5  0.0 - 3.0 %   Neutro Abs 6.0  1.4 - 7.7 K/uL   Lymphs  Abs 1.9  0.7 - 4.0 K/uL   Monocytes Absolute 0.8  0.1 - 1.0 K/uL   Eosinophils Absolute 0.2  0.0 - 0.7 K/uL   Basophils Absolute 0.0  0.0 - 0.1 K/uL  BASIC METABOLIC PANEL      Result Value Ref Range   Sodium 137  135 - 145 mEq/L   Potassium 4.9  3.5 - 5.1 mEq/L   Chloride 102  96 - 112 mEq/L   CO2 29  19 - 32 mEq/L   Glucose, Bld 102 (*) 70 - 99 mg/dL   BUN 16  6 - 23 mg/dL  Creatinine, Ser 0.9  0.4 - 1.2 mg/dL   Calcium 10.6 (*) 8.4 - 10.5 mg/dL   GFR 69.75  >60.00 mL/min  VITAMIN B12      Result Value Ref Range   Vitamin B-12 189 (*) 211 - 911 pg/mL      Assessment & Plan:   Problem List Items Addressed This Visit   TREMOR     Due to psychoactive med. continue propranolol tid    HYPERTENSION, BENIGN ESSENTIAL     Chronic, stable. Continue regimen. Monitor bradycardia on propranolol    RESOLVED: HYPERCHOLESTEROLEMIA, PURE     Chronic, stable off meds. Will remove from problem list.    Healthcare maintenance - Primary     Preventative protocols reviewed and updated unless pt declined. Discussed healthy diet and lifestyle.  Requests iFOB today.    GERD VIA EGD     Continue protonix daily.    Bipolar disorder, unspecified     Monitored closely by Dr. Lattie Haw psychiatry. Continue geodon, lithium, celexa, buspar and xanax.    B12 deficiency     Restart B12 shots as latest b12 level still too low. Consider checking IF next labwork. Discussed importance of monthly B12 shots     Other Visit Diagnoses   Special screening for malignant neoplasms, colon        Relevant Orders       Fecal occult blood, imunochemical        Follow up plan: Return in about 1 year (around 08/03/2014), or as needed, for annual exam, prior fasting for blood work.

## 2013-08-02 NOTE — Assessment & Plan Note (Signed)
Preventative protocols reviewed and updated unless pt declined. Discussed healthy diet and lifestyle.  Requests iFOB today.

## 2013-08-02 NOTE — Assessment & Plan Note (Signed)
Chronic, stable. Continue regimen. Monitor bradycardia on propranolol

## 2013-08-02 NOTE — Assessment & Plan Note (Signed)
Continue protonix daily. 

## 2013-08-07 ENCOUNTER — Other Ambulatory Visit: Payer: Self-pay | Admitting: Family Medicine

## 2013-08-16 ENCOUNTER — Other Ambulatory Visit: Payer: 59

## 2013-08-16 DIAGNOSIS — Z1211 Encounter for screening for malignant neoplasm of colon: Secondary | ICD-10-CM

## 2013-08-16 LAB — FECAL OCCULT BLOOD, IMMUNOCHEMICAL: FECAL OCCULT BLD: NEGATIVE

## 2013-08-16 LAB — FECAL OCCULT BLOOD, GUAIAC: Fecal Occult Blood: NEGATIVE

## 2013-08-19 ENCOUNTER — Encounter: Payer: Self-pay | Admitting: *Deleted

## 2013-09-05 ENCOUNTER — Ambulatory Visit (INDEPENDENT_AMBULATORY_CARE_PROVIDER_SITE_OTHER): Payer: 59

## 2013-09-05 DIAGNOSIS — E538 Deficiency of other specified B group vitamins: Secondary | ICD-10-CM

## 2013-09-05 MED ORDER — CYANOCOBALAMIN 1000 MCG/ML IJ SOLN
1000.0000 ug | Freq: Once | INTRAMUSCULAR | Status: AC
  Administered 2013-09-05: 1000 ug via INTRAMUSCULAR

## 2013-10-08 ENCOUNTER — Ambulatory Visit: Payer: 59

## 2013-11-07 ENCOUNTER — Ambulatory Visit (INDEPENDENT_AMBULATORY_CARE_PROVIDER_SITE_OTHER): Payer: 59

## 2013-11-07 DIAGNOSIS — E538 Deficiency of other specified B group vitamins: Secondary | ICD-10-CM

## 2013-11-07 MED ORDER — CYANOCOBALAMIN 1000 MCG/ML IJ SOLN
1000.0000 ug | Freq: Once | INTRAMUSCULAR | Status: AC
  Administered 2013-11-07: 1000 ug via INTRAMUSCULAR

## 2013-12-10 ENCOUNTER — Ambulatory Visit (INDEPENDENT_AMBULATORY_CARE_PROVIDER_SITE_OTHER): Payer: 59

## 2013-12-10 DIAGNOSIS — E538 Deficiency of other specified B group vitamins: Secondary | ICD-10-CM

## 2013-12-10 MED ORDER — CYANOCOBALAMIN 1000 MCG/ML IJ SOLN
1000.0000 ug | Freq: Once | INTRAMUSCULAR | Status: AC
  Administered 2013-12-10: 1000 ug via INTRAMUSCULAR

## 2014-01-14 ENCOUNTER — Ambulatory Visit (INDEPENDENT_AMBULATORY_CARE_PROVIDER_SITE_OTHER): Payer: 59

## 2014-01-14 DIAGNOSIS — E538 Deficiency of other specified B group vitamins: Secondary | ICD-10-CM

## 2014-01-14 MED ORDER — CYANOCOBALAMIN 1000 MCG/ML IJ SOLN
1000.0000 ug | Freq: Once | INTRAMUSCULAR | Status: AC
  Administered 2014-01-14: 1000 ug via INTRAMUSCULAR

## 2014-02-14 ENCOUNTER — Ambulatory Visit (INDEPENDENT_AMBULATORY_CARE_PROVIDER_SITE_OTHER): Payer: 59

## 2014-02-14 DIAGNOSIS — E538 Deficiency of other specified B group vitamins: Secondary | ICD-10-CM

## 2014-02-14 MED ORDER — CYANOCOBALAMIN 1000 MCG/ML IJ SOLN
1000.0000 ug | Freq: Once | INTRAMUSCULAR | Status: AC
  Administered 2014-02-14: 1000 ug via INTRAMUSCULAR

## 2014-05-05 ENCOUNTER — Ambulatory Visit (INDEPENDENT_AMBULATORY_CARE_PROVIDER_SITE_OTHER): Payer: 59 | Admitting: Family Medicine

## 2014-05-05 DIAGNOSIS — Z1211 Encounter for screening for malignant neoplasm of colon: Secondary | ICD-10-CM

## 2014-05-27 LAB — HM MAMMOGRAPHY: HM Mammogram: NORMAL

## 2014-06-11 HISTORY — PX: COLONOSCOPY: SHX174

## 2014-06-16 ENCOUNTER — Telehealth: Payer: Self-pay | Admitting: *Deleted

## 2014-06-16 NOTE — Telephone Encounter (Signed)
Mammogram f/u call: pt didn't have a recent mammogram in her chart so I called pt. Pt advise me she gets one every year at [facility] with Dr. Milta Deiters. I called and verified that pt did have recent mammogram in May. They faxed over a copy of her mammogram. Chart updated and copy of mammogram placed in Dr. Synthia Innocent inbox so he can sign and send for scanning

## 2014-06-25 LAB — HM COLONOSCOPY: HM COLON: NORMAL

## 2014-07-11 LAB — HM PAP SMEAR: HM PAP: NORMAL

## 2014-07-15 ENCOUNTER — Other Ambulatory Visit: Payer: Self-pay | Admitting: Family Medicine

## 2014-08-09 ENCOUNTER — Other Ambulatory Visit: Payer: Self-pay | Admitting: Family Medicine

## 2014-08-09 DIAGNOSIS — I10 Essential (primary) hypertension: Secondary | ICD-10-CM

## 2014-08-09 DIAGNOSIS — E538 Deficiency of other specified B group vitamins: Secondary | ICD-10-CM

## 2014-08-09 DIAGNOSIS — F319 Bipolar disorder, unspecified: Secondary | ICD-10-CM

## 2014-08-11 ENCOUNTER — Other Ambulatory Visit (INDEPENDENT_AMBULATORY_CARE_PROVIDER_SITE_OTHER): Payer: 59

## 2014-08-11 DIAGNOSIS — F319 Bipolar disorder, unspecified: Secondary | ICD-10-CM

## 2014-08-11 DIAGNOSIS — E538 Deficiency of other specified B group vitamins: Secondary | ICD-10-CM

## 2014-08-11 DIAGNOSIS — I10 Essential (primary) hypertension: Secondary | ICD-10-CM | POA: Diagnosis not present

## 2014-08-11 LAB — LIPID PANEL
CHOLESTEROL: 194 mg/dL (ref 0–200)
HDL: 85.3 mg/dL (ref >39.00)
LDL CALC: 88 mg/dL (ref 0–99)
NonHDL: 108.92
Total CHOL/HDL Ratio: 2
Triglycerides: 103 mg/dL (ref 0.0–149.0)
VLDL: 20.6 mg/dL (ref 0.0–40.0)

## 2014-08-11 LAB — BASIC METABOLIC PANEL
BUN: 17 mg/dL (ref 6–23)
CALCIUM: 10 mg/dL (ref 8.4–10.5)
CO2: 23 mEq/L (ref 19–32)
CREATININE: 0.95 mg/dL (ref 0.40–1.20)
Chloride: 101 mEq/L (ref 96–112)
GFR: 65.27 mL/min (ref >60.00)
Glucose, Bld: 101 mg/dL — ABNORMAL HIGH (ref 70–99)
Potassium: 4.5 mEq/L (ref 3.5–5.1)
SODIUM: 134 meq/L — AB (ref 135–145)

## 2014-08-11 LAB — TSH: TSH: 3.01 u[IU]/mL (ref 0.35–4.50)

## 2014-08-11 LAB — VITAMIN B12: Vitamin B-12: >1500 pg/mL — ABNORMAL HIGH (ref 211–911)

## 2014-08-11 LAB — VITAMIN D 25 HYDROXY (VIT D DEFICIENCY, FRACTURES): VITD: 56.99 ng/mL (ref 30.00–100.00)

## 2014-08-12 LAB — LITHIUM LEVEL: Lithium Lvl: 0.8 mEq/L (ref 0.80–1.40)

## 2014-08-12 LAB — PARATHYROID HORMONE, INTACT (NO CA): PTH: 75 pg/mL — ABNORMAL HIGH (ref 14–64)

## 2014-08-14 ENCOUNTER — Encounter: Payer: Self-pay | Admitting: Family Medicine

## 2014-08-14 ENCOUNTER — Ambulatory Visit (INDEPENDENT_AMBULATORY_CARE_PROVIDER_SITE_OTHER): Payer: 59 | Admitting: Family Medicine

## 2014-08-14 VITALS — BP 122/70 | HR 88 | Temp 98.3°F | Ht 61.75 in | Wt 169.2 lb

## 2014-08-14 DIAGNOSIS — I10 Essential (primary) hypertension: Secondary | ICD-10-CM

## 2014-08-14 DIAGNOSIS — E538 Deficiency of other specified B group vitamins: Secondary | ICD-10-CM

## 2014-08-14 DIAGNOSIS — R7301 Impaired fasting glucose: Secondary | ICD-10-CM

## 2014-08-14 DIAGNOSIS — K219 Gastro-esophageal reflux disease without esophagitis: Secondary | ICD-10-CM

## 2014-08-14 DIAGNOSIS — Z0000 Encounter for general adult medical examination without abnormal findings: Secondary | ICD-10-CM | POA: Diagnosis not present

## 2014-08-14 DIAGNOSIS — F319 Bipolar disorder, unspecified: Secondary | ICD-10-CM

## 2014-08-14 DIAGNOSIS — G251 Drug-induced tremor: Secondary | ICD-10-CM

## 2014-08-14 MED ORDER — PANTOPRAZOLE SODIUM 40 MG PO TBEC: DELAYED_RELEASE_TABLET | ORAL | Status: DC

## 2014-08-14 MED ORDER — LISINOPRIL 10 MG PO TABS: ORAL_TABLET | ORAL | Status: DC

## 2014-08-14 NOTE — Assessment & Plan Note (Signed)
Preventative protocols reviewed and updated unless pt declined. Discussed healthy diet and lifestyle.  

## 2014-08-14 NOTE — Assessment & Plan Note (Signed)
Completed 1.5 yrs B12 shots. Now on oral tablet daily. Levels high.

## 2014-08-14 NOTE — Patient Instructions (Addendum)
Sign release up front for records of colonoscopy by Dr Collene Mares. I've sent Dr Lattie Haw copy of [place]. Increase exercise in routine - start walking on mom's treadmill more regularly. You are doing well today. Return as needed or in 1 year for next physical.

## 2014-08-14 NOTE — Assessment & Plan Note (Signed)
Check A1c next labwork.

## 2014-08-14 NOTE — Progress Notes (Signed)
BP 122/70 mmHg  Pulse 88  Temp(Src) 98.3 F (36.8 C) (Oral)  Ht 5' 1.75" (1.568 m)  Wt 169 lb 4 oz (76.771 kg)  BMI 31.23 kg/m2   CC: CPE  Subjective:    Patient ID: Katelyn Price, female    DOB: 1960-03-01, 54 y.o.   MRN: [id number]  HPI: Katelyn Price is a 54 y.o. female presenting on 08/14/2014 for Annual Exam   Bipolar - sees Eartha Inch regularly Q6 mo. Off propranolol and on guanfacine for involuntary movements. Vit B12 - received shots for 1.5 years, stopped 02/2014.  GERD - unsure if she's taking protonix. Takes OTC acid reducer prn.   Preventative: Well woman with OBGYN (Dr. Nori Riis), normal mammogram and pap smear per patient 07/2014 COLONOSCOPY normal per patient Collene Mares) Tdap - 06/2011  Flu shot - yearly Seat belt use discussed  Sunscreen use discussed, no changing moles  Caffeine: drinks diet drinks  Lives with husband and 1 dog, 1 cat  Occupation: housewife  Activity: no regular exercise, doesn't feel safe to walk outside at home, could walk on mother's treadmill Diet: good water, fruits/vegetables some   Relevant past medical, surgical, family and social history reviewed and updated as indicated. Interim medical history since our last visit reviewed. Allergies and medications reviewed and updated. Current Outpatient Prescriptions on File Prior to Visit  Medication Sig  . ALPRAZolam (XANAX) 0.25 MG tablet Take one by mouth two times a day as needed   . buPROPion (WELLBUTRIN XL) 150 MG 24 hr tablet Take 300 mg by mouth daily.   . busPIRone (BUSPAR) 15 MG tablet Take 7.5 mg by mouth 3 (three) times daily.   . Cholecalciferol (VITAMIN D) 2000 UNITS CAPS Take 1 capsule by mouth daily.   . citalopram (CELEXA) 10 MG tablet Take 10 mg by mouth daily.  . clindamycin (CLEOCIN T) 1 % lotion Apply 1 application topically daily.  Marland Kitchen estradiol (ESTRACE) 2 MG tablet Take 2 mg by mouth daily.  . fluticasone (FLONASE) 50 MCG/ACT nasal spray Place 2 sprays into the nose  daily as needed.   . lithium carbonate (LITHOBID) 300 MG CR tablet Take 300 mg by mouth 2 (two) times daily.  . ziprasidone (GEODON) 80 MG capsule Take 240 mg by mouth at bedtime.    No current facility-administered medications on file prior to visit.    Review of Systems  Constitutional: Negative for fever, chills, activity change, appetite change, fatigue and unexpected weight change.  HENT: Negative for hearing loss.   Eyes: Negative for visual disturbance.  Respiratory: Negative for cough, chest tightness, shortness of breath and wheezing.   Cardiovascular: Negative for chest pain, palpitations and leg swelling.  Gastrointestinal: Negative for nausea, vomiting, abdominal pain, diarrhea, constipation, blood in stool and abdominal distention.  Genitourinary: Negative for hematuria and difficulty urinating.  Musculoskeletal: Negative for myalgias, arthralgias and neck pain.  Skin: Negative for rash.  Neurological: Negative for dizziness, seizures, syncope and headaches.  Hematological: Negative for adenopathy. Bruises/bleeds easily.  Psychiatric/Behavioral: Positive for dysphoric mood. The patient is nervous/anxious.    Per HPI unless specifically indicated above     Objective:    BP 122/70 mmHg  Pulse 88  Temp(Src) 98.3 F (36.8 C) (Oral)  Ht 5' 1.75" (1.568 m)  Wt 169 lb 4 oz (76.771 kg)  BMI 31.23 kg/m2  Wt Readings from Last 3 Encounters:  08/14/14 169 lb 4 oz (76.771 kg)  08/02/13 166 lb (75.297 kg)  08/01/12 156  lb 8 oz (70.988 kg)    Physical Exam  Constitutional: She is oriented to person, place, and time. She appears well-developed and well-nourished. No distress.  HENT:  Head: Normocephalic and atraumatic.  Right Ear: Hearing, tympanic membrane, external ear and ear canal normal.  Left Ear: Hearing, tympanic membrane, external ear and ear canal normal.  Nose: Nose normal.  Mouth/Throat: Uvula is midline, oropharynx is clear and moist and mucous membranes are  normal. No oropharyngeal exudate, posterior oropharyngeal edema or posterior oropharyngeal erythema.  Eyes: Conjunctivae and EOM are normal. Pupils are equal, round, and reactive to light. No scleral icterus.  Neck: Normal range of motion. Neck supple. No thyromegaly present.  Cardiovascular: Normal rate, regular rhythm, normal heart sounds and intact distal pulses.   No murmur heard. Pulses:      Radial pulses are 2+ on the right side, and 2+ on the left side.  Pulmonary/Chest: Effort normal and breath sounds normal. No respiratory distress. She has no wheezes. She has no rales.  Beast - per GYN  Abdominal: Soft. Bowel sounds are normal. She exhibits no distension and no mass. There is no tenderness. There is no rebound and no guarding.  Genitourinary:  Pelvic - deferred per GYN  Musculoskeletal: Normal range of motion. She exhibits no edema.  Lymphadenopathy:    She has no cervical adenopathy.  Neurological: She is alert and oriented to person, place, and time.  CN grossly intact, station and gait intact  Skin: Skin is warm and dry. No rash noted.  Psychiatric: She has a normal mood and affect. Her behavior is normal. Judgment and thought content normal.  Nursing note and vitals reviewed.  Results for orders placed or performed in visit on 08/14/14  HM PAP SMEAR  Result Value Ref Range   HM Pap smear per patient normal        Assessment & Plan:   Problem List Items Addressed This Visit    B12 deficiency    Completed 1.5 yrs B12 shots. Now on oral tablet daily. Levels high.       Bipolar disorder    Followed by psych Dr Lattie Haw. On geodon, lithium, celexa, buspar, wellbutrin and xanax. Weight gain noted. Tremor noted. Recent guanfacine was started.      GERD VIA EGD    Unclear if she is taking protonix 40mg  daily. Will change to PRN GERD sxs. She is also taking OTC acid reducer PRN.      Relevant Medications   pantoprazole (PROTONIX) 40 MG tablet   Healthcare maintenance -  Primary    Preventative protocols reviewed and updated unless pt declined. Discussed healthy diet and lifestyle.       Hypercalcemia    Actually normal today. Normal SPEP 07/2012, mildly elevated PTH today.      HYPERTENSION, BENIGN ESSENTIAL    Chronic, stable. Continue current regimen.  Now on lisinopril 10mg  daily (refilled) and guanfacine.      Relevant Medications   guanFACINE (TENEX) 1 MG tablet   lisinopril (PRINIVIL,ZESTRIL) 10 MG tablet   Impaired fasting glucose    Check A1c next labwork.      Tremor due to multiple drugs    Off propranolol, now on guanfacine. Worsened.          Follow up plan: Return in about 1 year (around 08/14/2015), or as needed, for annual exam, prior fasting for blood work.

## 2014-08-14 NOTE — Assessment & Plan Note (Signed)
Actually normal today. Normal SPEP 07/2012, mildly elevated PTH today.

## 2014-08-14 NOTE — Assessment & Plan Note (Signed)
Followed by psych Dr Lattie Haw. On geodon, lithium, celexa, buspar, wellbutrin and xanax. Weight gain noted. Tremor noted. Recent guanfacine was started.

## 2014-08-14 NOTE — Assessment & Plan Note (Addendum)
Chronic, stable. Continue current regimen.  Now on lisinopril 10mg  daily (refilled) and guanfacine.

## 2014-08-14 NOTE — Assessment & Plan Note (Signed)
Off propranolol, now on guanfacine. Worsened.

## 2014-08-14 NOTE — Assessment & Plan Note (Signed)
Unclear if she is taking protonix 40mg  daily. Will change to PRN GERD sxs. She is also taking OTC acid reducer PRN.

## 2014-08-14 NOTE — Progress Notes (Signed)
Pre visit review using our clinic review tool, if applicable. No additional management support is needed unless otherwise documented below in the visit note. 

## 2014-08-16 ENCOUNTER — Encounter: Payer: Self-pay | Admitting: Family Medicine

## 2014-08-16 NOTE — Progress Notes (Signed)
Opened in error. Updating colonoscopy.

## 2014-10-13 ENCOUNTER — Telehealth: Payer: Self-pay | Admitting: Family Medicine

## 2014-10-13 ENCOUNTER — Ambulatory Visit (INDEPENDENT_AMBULATORY_CARE_PROVIDER_SITE_OTHER): Payer: 59 | Admitting: Family Medicine

## 2014-10-13 ENCOUNTER — Encounter: Payer: Self-pay | Admitting: Family Medicine

## 2014-10-13 VITALS — BP 126/86 | HR 68 | Temp 98.5°F | Wt 173.0 lb

## 2014-10-13 DIAGNOSIS — R0789 Other chest pain: Secondary | ICD-10-CM | POA: Diagnosis not present

## 2014-10-13 DIAGNOSIS — R079 Chest pain, unspecified: Secondary | ICD-10-CM | POA: Insufficient documentation

## 2014-10-13 DIAGNOSIS — Z23 Encounter for immunization: Secondary | ICD-10-CM

## 2014-10-13 NOTE — Telephone Encounter (Signed)
plz notify pt I'd like her to come back in for lab visit only to check some labs for chest pain as well as pulse ox.

## 2014-10-13 NOTE — Progress Notes (Signed)
Pre visit review using our clinic review tool, if applicable. No additional management support is needed unless otherwise documented below in the visit note. 

## 2014-10-13 NOTE — Patient Instructions (Signed)
flu shot today EKG overall ok today. I'm not sure where chest pain is coming from - pass by [facility]'s office for cardiology referral for further evaluation. Let us know sooner if recurrent chest pain.

## 2014-10-13 NOTE — Telephone Encounter (Signed)
Patient Name: Katelyn Price DOB: May 12, 1960 Initial Comment Caller States she is having pain in both her arms and pain in the middle of the chest, dull intense pain. no trouble in breathing but makes her break out in a sweat. has to keep moving around, cant get comfortable. Nurse Assessment Nurse: Ronnald Ramp, RN, Miranda Date/Time (Eastern Time): 10/13/2014 11:34:52 AM Confirm and document reason for call. If symptomatic, describe symptoms. ---Caller states she has had episode of pain in the center of her chest, sweating, and arm pain off and on for 1 month. Not currently having symptoms. Episodes last < 5 min. Has the patient traveled out of the country within the last 30 days? ---Not Applicable Does the patient require triage? ---Yes Related visit to physician within the last 2 weeks? ---No Does the PT have any chronic conditions? (i.e. diabetes, asthma, etc.) ---Yes List chronic conditions. ---Bi-polar, HTN Did the patient indicate they were pregnant? ---No Guidelines Guideline Title Affirmed Question Affirmed Notes Chest Pain [1] Chest pain lasting <= 5 minutes AND [2] NO chest pain or cardiac symptoms now (Exceptions: pains lasting a few seconds) Final Disposition User See Physician within 24 Hours Ronnald Ramp, RN, Miranda Comments Appt scheduled for 3:30 with Dr. Danise Mina. Disagree/Comply: Comply

## 2014-10-13 NOTE — Assessment & Plan Note (Addendum)
Unclear etiology of episodes of substernal chest soreness/ache. Not consistent with MSK or GERD cause. No significant cardiac risk factors as HTN well controlled but she is on estrogen.  She is also on several psychoactive medications per psych - ?related.  Will have her return for pulse ox and D dimer to r/o PE as cause.  Will also refer to cards for further eval of chest pain of unclear cause. Pt agrees with plan.   EKG - NSR rate 50s, normal axis, intervals, no acute ST/T changes.

## 2014-10-13 NOTE — Progress Notes (Signed)
BP 126/86 mmHg  Pulse 68  Temp(Src) 98.5 F (36.9 C) (Oral)  Wt 173 lb (78.472 kg)   CC: chest pain  Subjective:    Patient ID: Katelyn Price, female    DOB: [birth year], 54 y.o.   MRN: [id number]  HPI: Katelyn Price is a 54 y.o. female presenting on 10/13/2014 for Chest Pain   Over the last month has had 4 episodes of intense substernal pain in chest and down bilateral arms described as achy pain. First 2 episodes woke her up from sleep. All have been at rest. Episodes last 5 minutes, make her get up and move. Waits pain out. Denies associated nausea or dyspnea or cough. + diaphoresis and lightheadedness. Pain not reproducible. No radiation of pain to abdomen or to back or to neck. No palpitations, dizziness.   No fmhx CAD.  No HLD, DM history.  HTN well controlled on ACEI.  She is on estrogen 2mg  daily.  No regular exercise.   On multiple psychotropics per psychiatry.   GI history - known GERD, gastritis and HH. Current pain feels different. Regular with pantoprazole 40mg  daily.  Relevant past medical, surgical, family and social history reviewed and updated as indicated. Interim medical history since our last visit reviewed. Allergies and medications reviewed and updated. Current Outpatient Prescriptions on File Prior to Visit  Medication Sig  . ALPRAZolam (XANAX) 0.25 MG tablet Take one by mouth two times a day as needed   . buPROPion (WELLBUTRIN XL) 150 MG 24 hr tablet Take 300 mg by mouth daily.   . busPIRone (BUSPAR) 15 MG tablet Take 7.5 mg by mouth 3 (three) times daily.   . Cholecalciferol (VITAMIN D) 2000 UNITS CAPS Take 1 capsule by mouth daily.   . citalopram (CELEXA) 10 MG tablet Take 10 mg by mouth daily.  . clindamycin (CLEOCIN T) 1 % lotion Apply 1 application topically daily.  Marland Kitchen estradiol (ESTRACE) 2 MG tablet Take 2 mg by mouth daily.  . fluticasone (FLONASE) 50 MCG/ACT nasal spray Place 2 sprays into the nose daily as needed.   Marland Kitchen guanFACINE (TENEX) 1  MG tablet Take 1 mg by mouth 2 (two) times daily.  Marland Kitchen lisinopril (PRINIVIL,ZESTRIL) 10 MG tablet TAKE 1 TABLET (10 MG TOTAL) BY MOUTH DAILY.  Marland Kitchen lithium carbonate (LITHOBID) 300 MG CR tablet Take 300 mg by mouth 2 (two) times daily.  . pantoprazole (PROTONIX) 40 MG tablet TAKE 1 TABLET (40 MG TOTAL) BY MOUTH DAILY as needed  . vitamin B-12 (CYANOCOBALAMIN) 500 MCG tablet Take 500 mcg by mouth daily.  . ziprasidone (GEODON) 80 MG capsule Take 240 mg by mouth at bedtime.    No current facility-administered medications on file prior to visit.    Review of Systems Per HPI unless specifically indicated above     Objective:    BP 126/86 mmHg  Pulse 68  Temp(Src) 98.5 F (36.9 C) (Oral)  Wt 173 lb (78.472 kg)  Wt Readings from Last 3 Encounters:  10/13/14 173 lb (78.472 kg)  08/14/14 169 lb 4 oz (76.771 kg)  08/02/13 166 lb (75.297 kg)   Body mass index is 31.92 kg/(m^2).  Physical Exam  Constitutional: She appears well-developed and well-nourished. No distress.  HENT:  Head: Normocephalic and atraumatic.  Mouth/Throat: Oropharynx is clear and moist. No oropharyngeal exudate.  Cardiovascular: Normal rate, regular rhythm, normal heart sounds and intact distal pulses.   No murmur heard. Pulmonary/Chest: Effort normal and breath sounds normal. No respiratory distress.  She has no wheezes. She has no rales. She exhibits no tenderness.  No reproducible chest wall pain  Musculoskeletal: She exhibits no edema.  FROM at shoulders without pain No pain to palpation of shoulders  Neurological:  5/5 strength BUE Tremor present  Nursing note and vitals reviewed.  Results for orders placed or performed in visit on 08/14/14  HM PAP SMEAR  Result Value Ref Range   HM Pap smear per patient normal        Assessment & Plan:   Problem List Items Addressed This Visit    Chest pain - Primary    Unclear etiology of episodes of substernal chest soreness/ache. Not consistent with MSK or GERD cause.  No significant cardiac risk factors as HTN well controlled but she is on estrogen.  She is also on several psychoactive medications per psych - ?related.  Will have her return for pulse ox and D dimer to r/o PE as cause.  Will also refer to cards for further eval of chest pain of unclear cause. Pt agrees with plan.   EKG - NSR rate 50s, normal axis, intervals, no acute ST/T changes.       Relevant Orders   EKG 12-Lead (Completed)   Ambulatory referral to Cardiology    Other Visit Diagnoses    Need for influenza vaccination        Relevant Orders    Flu Vaccine QUAD 36+ mos PF IM (Fluarix & Fluzone Quad PF) (Completed)        Follow up plan: No Follow-up on file.

## 2014-10-13 NOTE — Telephone Encounter (Signed)
Pt has appt with Dr Darnell Level 10/13/14 at 3:30 pm.

## 2014-10-14 NOTE — Telephone Encounter (Signed)
Message left for patient to return my call.  

## 2014-10-15 ENCOUNTER — Other Ambulatory Visit (INDEPENDENT_AMBULATORY_CARE_PROVIDER_SITE_OTHER): Payer: 59

## 2014-10-15 DIAGNOSIS — R0789 Other chest pain: Secondary | ICD-10-CM | POA: Diagnosis not present

## 2014-10-15 LAB — CBC WITH DIFFERENTIAL/PLATELET
BASOS PCT: 0.9 % (ref 0.0–3.0)
Basophils Absolute: 0.1 10*3/uL (ref 0.0–0.1)
EOS ABS: 0.2 10*3/uL (ref 0.0–0.7)
Eosinophils Relative: 3.3 % (ref 0.0–5.0)
HCT: 35.3 % — ABNORMAL LOW (ref 36.0–46.0)
HEMOGLOBIN: 11.6 g/dL — AB (ref 12.0–15.0)
Lymphocytes Relative: 29.5 % (ref 12.0–46.0)
Lymphs Abs: 1.6 10*3/uL (ref 0.7–4.0)
MCHC: 32.7 g/dL (ref 30.0–36.0)
MCV: 90.1 fl (ref 78.0–100.0)
MONO ABS: 1 10*3/uL (ref 0.1–1.0)
Monocytes Relative: 17 % — ABNORMAL HIGH (ref 3.0–12.0)
Neutro Abs: 2.8 10*3/uL (ref 1.4–7.7)
Neutrophils Relative %: 49.3 % (ref 43.0–77.0)
PLATELETS: 271 10*3/uL (ref 150.0–400.0)
RBC: 3.92 Mil/uL (ref 3.87–5.11)
RDW: 14.4 % (ref 11.5–15.5)
WBC: 5.6 10*3/uL (ref 4.0–10.5)

## 2014-10-15 NOTE — Telephone Encounter (Signed)
Patient received message and scheduled appt.

## 2014-10-16 LAB — D-DIMER, QUANTITATIVE (NOT AT ARMC): D DIMER QUANT: 0.86 ug{FEU}/mL — AB (ref 0.00–0.48)

## 2014-10-17 NOTE — Telephone Encounter (Signed)
Late entry. Pulse ox per CMA was 99% on RA.

## 2014-10-31 ENCOUNTER — Ambulatory Visit: Payer: 59 | Admitting: Cardiovascular Disease

## 2014-11-14 ENCOUNTER — Telehealth: Payer: Self-pay | Admitting: Family Medicine

## 2014-11-14 NOTE — Telephone Encounter (Signed)
Patient returned Kim's call. °

## 2014-11-14 NOTE — Telephone Encounter (Signed)
Spoke with patient.

## 2014-11-14 NOTE — Telephone Encounter (Addendum)
Returned call. No answer. Left message

## 2014-11-24 ENCOUNTER — Other Ambulatory Visit: Payer: Self-pay | Admitting: *Deleted

## 2014-11-24 MED ORDER — PANTOPRAZOLE SODIUM 40 MG PO TBEC: DELAYED_RELEASE_TABLET | ORAL | Status: DC

## 2015-02-16 IMAGING — US US ABDOMEN COMPLETE
1 series · 14 of 25 positions shown · non-contrast
Comparison: None.

CLINICAL DATA: Nausea and renal insufficiency.  Anemia.

ABDOMINAL ULTRASOUND COMPLETE

[Series 1: us abdomen complete · 0.43mm/px · 14 of 79 slices shown]
[im 1/79]
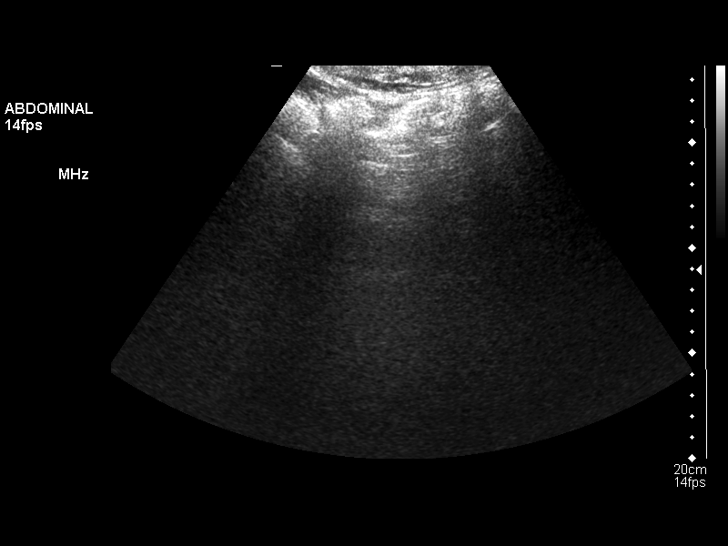
[im 7/79]
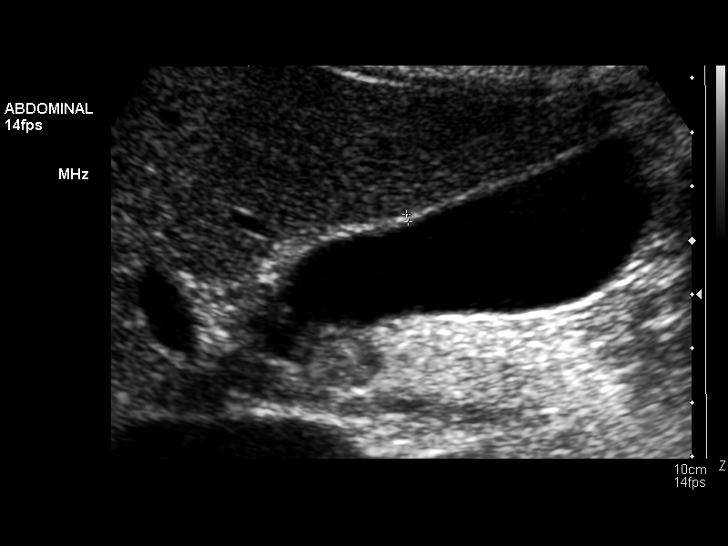
[im 14/79]
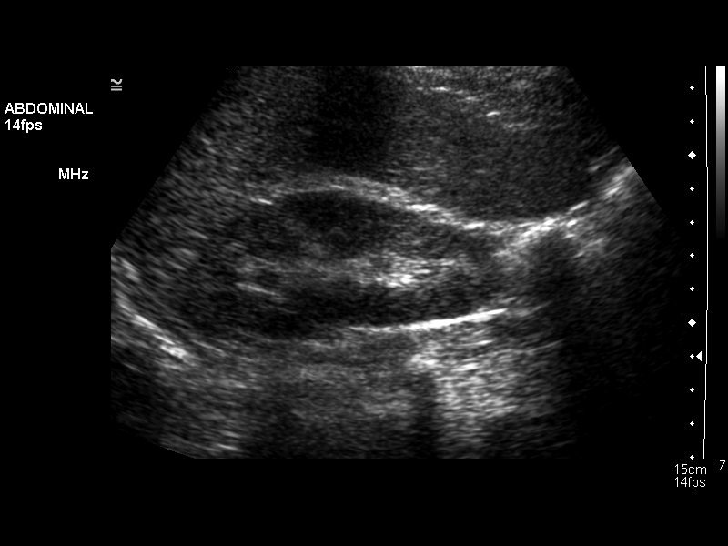
[im 20/79]
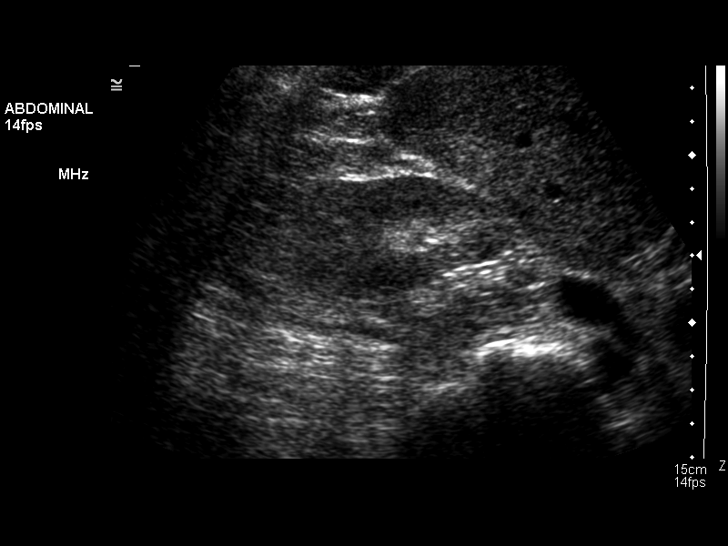
[im 27/79]
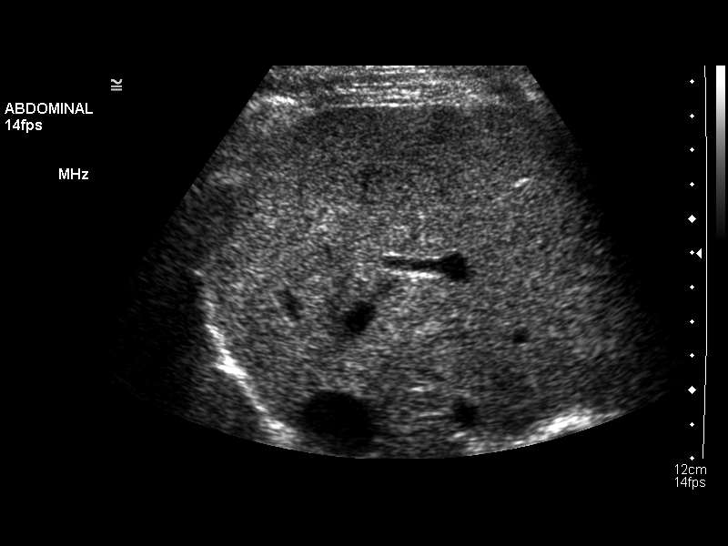
[im 30/79]
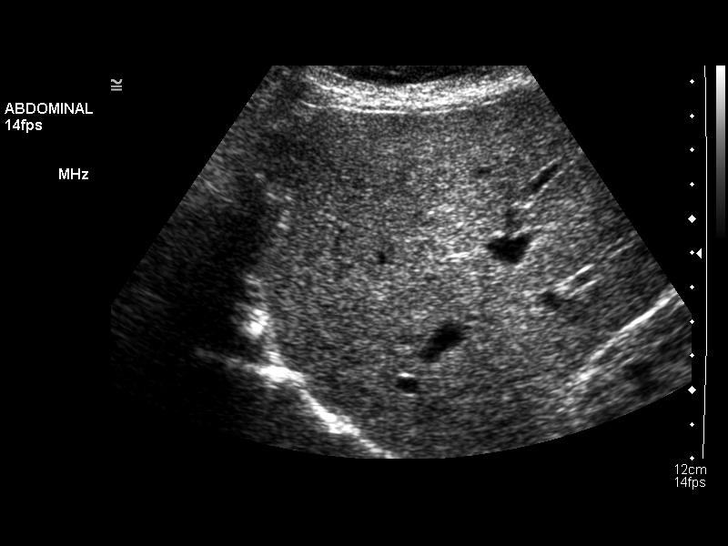
[im 36/79]
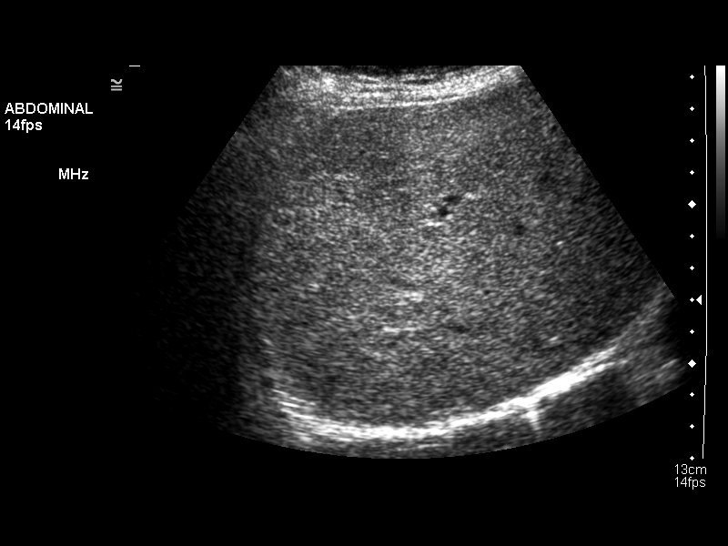
[im 43/79]
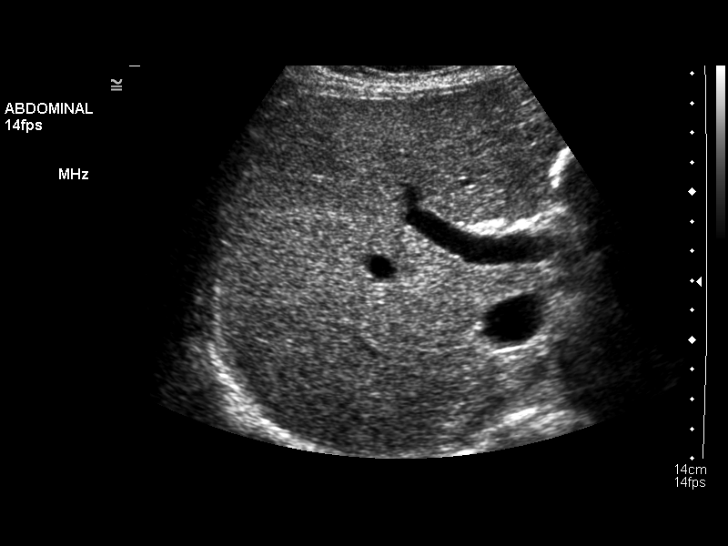
[im 49/79]
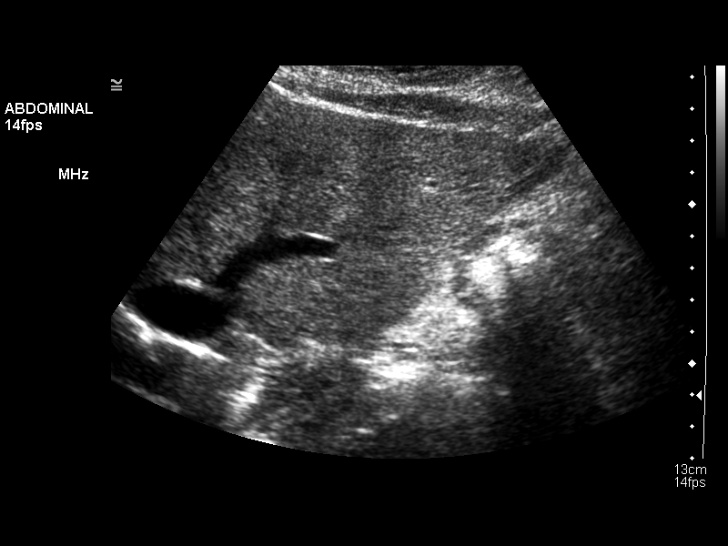
[im 53/79]
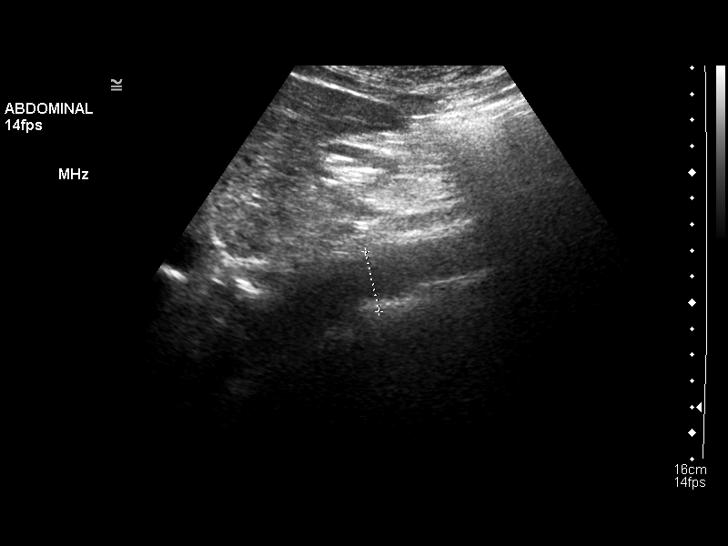
[im 59/79]
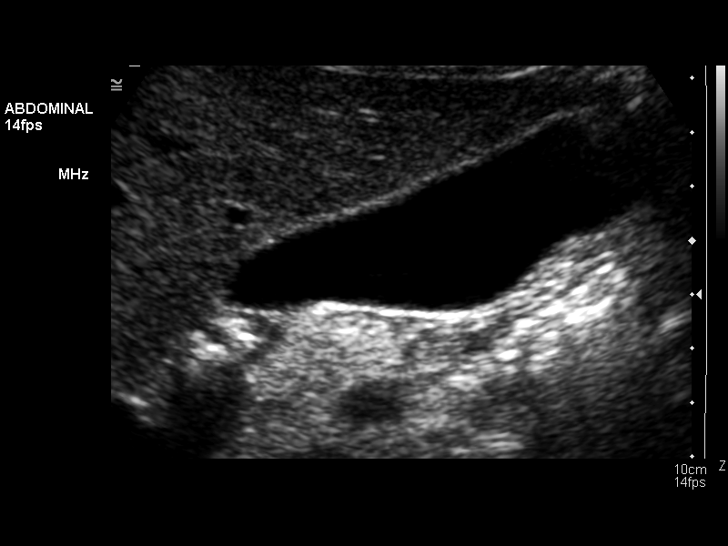
[im 66/79]
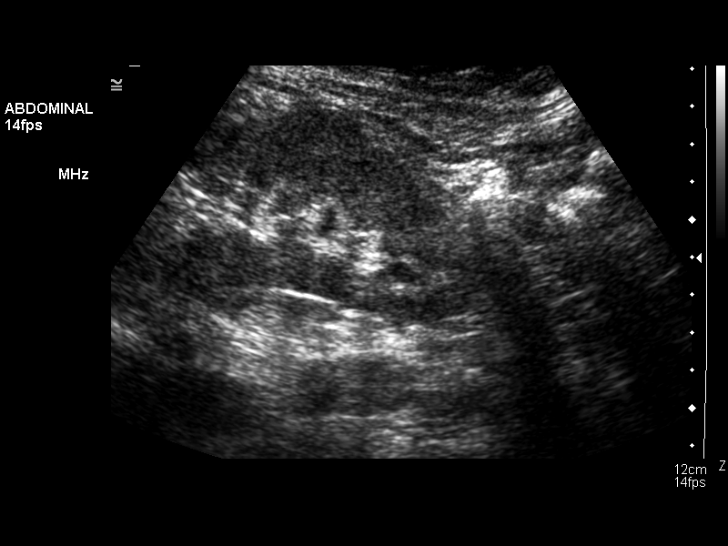
[im 72/79]
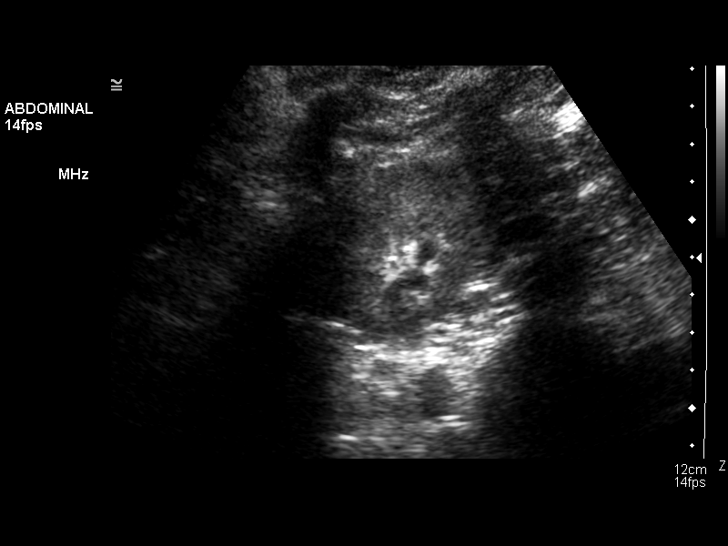
[im 79/79]
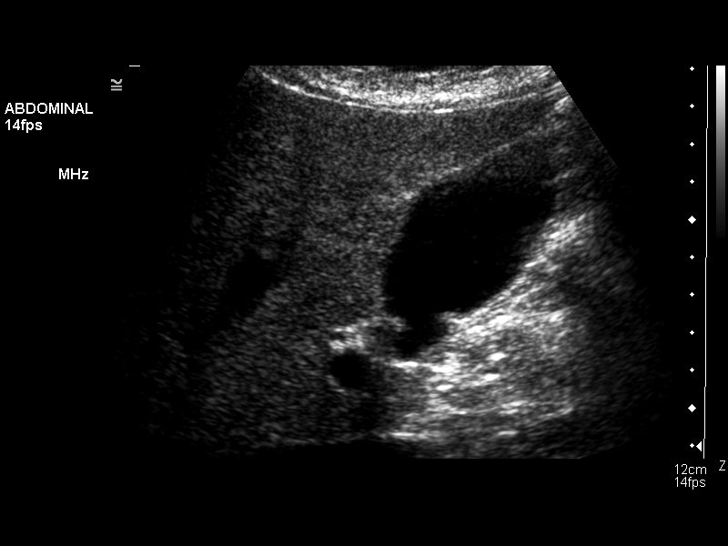

[14 of 25 positions shown; findings below may reference images not displayed]

FINDINGS: Gallbladder:  No gallstones, gallbladder wall thickening, or
pericholecystic fluid.Small amount of sludge is noted within the
gallbladder lumen.

Common Bile Duct:  Within normal limits in caliber.

Liver: No focal mass lesion identified.  Within normal limits in
parenchymal echogenicity.

IVC:  Appears normal.

Pancreas:  No abnormality identified.

Spleen:  Within normal limits in size and echotexture.

Right kidney:  Normal in size and parenchymal echogenicity.  No
evidence of mass or hydronephrosis.

Left kidney:  Normal in size and parenchymal echogenicity.  No
evidence of mass or hydronephrosis.

Abdominal Aorta:  No aneurysm identified.
IMPRESSION: 1.  No acute findings.
2.  Small amount of gallbladder sludge noted.  No secondary signs
of acute cholecystitis.

## 2015-06-11 LAB — HM MAMMOGRAPHY: HM Mammogram: NORMAL (ref 0–4)

## 2015-06-11 LAB — HM PAP SMEAR: HM Pap smear: NORMAL

## 2015-07-07 ENCOUNTER — Other Ambulatory Visit: Payer: Self-pay | Admitting: Family Medicine

## 2015-08-20 ENCOUNTER — Other Ambulatory Visit: Payer: Self-pay | Admitting: Family Medicine

## 2015-08-20 DIAGNOSIS — I10 Essential (primary) hypertension: Secondary | ICD-10-CM

## 2015-08-20 DIAGNOSIS — E538 Deficiency of other specified B group vitamins: Secondary | ICD-10-CM

## 2015-08-20 DIAGNOSIS — F3170 Bipolar disorder, currently in remission, most recent episode unspecified: Secondary | ICD-10-CM

## 2015-08-21 ENCOUNTER — Other Ambulatory Visit (INDEPENDENT_AMBULATORY_CARE_PROVIDER_SITE_OTHER): Payer: 59

## 2015-08-21 DIAGNOSIS — I10 Essential (primary) hypertension: Secondary | ICD-10-CM

## 2015-08-21 DIAGNOSIS — F3170 Bipolar disorder, currently in remission, most recent episode unspecified: Secondary | ICD-10-CM | POA: Diagnosis not present

## 2015-08-21 DIAGNOSIS — E538 Deficiency of other specified B group vitamins: Secondary | ICD-10-CM

## 2015-08-21 LAB — LIPID PANEL
CHOL/HDL RATIO: 2
Cholesterol: 157 mg/dL (ref 0–200)
HDL: 66.7 mg/dL (ref >39.00)
LDL CALC: 65 mg/dL (ref 0–99)
NONHDL: 90.33
TRIGLYCERIDES: 128 mg/dL (ref 0.0–149.0)
VLDL: 25.6 mg/dL (ref 0.0–40.0)

## 2015-08-21 LAB — CBC WITH DIFFERENTIAL/PLATELET
BASOS PCT: 0.5 % (ref 0.0–3.0)
Basophils Absolute: 0 10*3/uL (ref 0.0–0.1)
Eosinophils Absolute: 0.2 10*3/uL (ref 0.0–0.7)
Eosinophils Relative: 2.3 % (ref 0.0–5.0)
HEMATOCRIT: 37.8 % (ref 36.0–46.0)
HEMOGLOBIN: 12.5 g/dL (ref 12.0–15.0)
LYMPHS PCT: 24.3 % (ref 12.0–46.0)
Lymphs Abs: 2.2 10*3/uL (ref 0.7–4.0)
MCHC: 33.1 g/dL (ref 30.0–36.0)
MCV: 91 fl (ref 78.0–100.0)
Monocytes Absolute: 0.9 10*3/uL (ref 0.1–1.0)
Monocytes Relative: 9.9 % (ref 3.0–12.0)
Neutro Abs: 5.6 10*3/uL (ref 1.4–7.7)
Neutrophils Relative %: 63 % (ref 43.0–77.0)
Platelets: 283 10*3/uL (ref 150.0–400.0)
RBC: 4.15 Mil/uL (ref 3.87–5.11)
RDW: 14.1 % (ref 11.5–15.5)
WBC: 8.9 10*3/uL (ref 4.0–10.5)

## 2015-08-21 LAB — BASIC METABOLIC PANEL
BUN: 17 mg/dL (ref 6–23)
CALCIUM: 10.4 mg/dL (ref 8.4–10.5)
CO2: 28 meq/L (ref 19–32)
CREATININE: 0.81 mg/dL (ref 0.40–1.20)
Chloride: 102 mEq/L (ref 96–112)
GFR: 78.16 mL/min (ref >60.00)
GLUCOSE: 106 mg/dL — AB (ref 70–99)
Potassium: 4.5 mEq/L (ref 3.5–5.1)
Sodium: 135 mEq/L (ref 135–145)

## 2015-08-21 LAB — TSH: TSH: 0.02 u[IU]/mL — AB (ref 0.35–4.50)

## 2015-08-21 LAB — VITAMIN B12

## 2015-08-22 LAB — LITHIUM LEVEL: Lithium Lvl: 0.7 mmol/L (ref 0.6–1.2)

## 2015-08-25 ENCOUNTER — Encounter: Payer: Self-pay | Admitting: Family Medicine

## 2015-08-25 ENCOUNTER — Ambulatory Visit (INDEPENDENT_AMBULATORY_CARE_PROVIDER_SITE_OTHER): Payer: 59 | Admitting: Family Medicine

## 2015-08-25 VITALS — BP 102/80 | HR 57 | Ht 61.75 in | Wt 171.0 lb

## 2015-08-25 DIAGNOSIS — K219 Gastro-esophageal reflux disease without esophagitis: Secondary | ICD-10-CM

## 2015-08-25 DIAGNOSIS — Z0000 Encounter for general adult medical examination without abnormal findings: Secondary | ICD-10-CM | POA: Diagnosis not present

## 2015-08-25 DIAGNOSIS — E0500 Thyrotoxicosis with diffuse goiter without thyrotoxic crisis or storm: Secondary | ICD-10-CM | POA: Insufficient documentation

## 2015-08-25 DIAGNOSIS — F3170 Bipolar disorder, currently in remission, most recent episode unspecified: Secondary | ICD-10-CM

## 2015-08-25 DIAGNOSIS — I10 Essential (primary) hypertension: Secondary | ICD-10-CM

## 2015-08-25 DIAGNOSIS — R7301 Impaired fasting glucose: Secondary | ICD-10-CM

## 2015-08-25 DIAGNOSIS — E0590 Thyrotoxicosis, unspecified without thyrotoxic crisis or storm: Secondary | ICD-10-CM | POA: Diagnosis not present

## 2015-08-25 DIAGNOSIS — G251 Drug-induced tremor: Secondary | ICD-10-CM

## 2015-08-25 DIAGNOSIS — E538 Deficiency of other specified B group vitamins: Secondary | ICD-10-CM

## 2015-08-25 LAB — TSH: TSH: 0.03 u[IU]/mL — ABNORMAL LOW (ref 0.35–4.50)

## 2015-08-25 LAB — T4, FREE: FREE T4: 1.55 ng/dL (ref 0.60–1.60)

## 2015-08-25 MED ORDER — PANTOPRAZOLE SODIUM 40 MG PO TBEC: 40.0000 mg | DELAYED_RELEASE_TABLET | Freq: Every day | ORAL | 3 refills | Status: DC

## 2015-08-25 MED ORDER — LISINOPRIL 10 MG PO TABS: ORAL_TABLET | ORAL | 3 refills | Status: DC

## 2015-08-25 NOTE — Patient Instructions (Addendum)
Vitamin B12 was high - decrease dose to 544mg mondays, wednesdays and fridays.  Cut down on miralax dose due to diarrhea. I want to further evaluate thyroid with labs today and thyroid ultrasound. We may refer you to endocrinologist.  Good to see you today, call [place] questions. Return as needed or in 1 year for next physical.  Health Maintenance, Female Adopting a healthy lifestyle and getting preventive care can go a long way to promote health and wellness. Talk with your health care provider about what schedule of regular examinations is right for you. This is a good chance for you to check in with your provider about disease prevention and staying healthy. In between checkups, there are plenty of things you can do on your own. Experts have done a lot of research about which lifestyle changes and preventive measures are most likely to keep you healthy. Ask your health care provider for more information. WEIGHT AND DIET  Eat a healthy diet  Be sure to include plenty of vegetables, fruits, low-fat dairy products, and lean protein.  Do not eat a lot of foods high in solid fats, added sugars, or salt.  Get regular exercise. This is one of the most important things you can do for your health.  Most adults should exercise for at least 150 minutes each week. The exercise should increase your heart rate and make you sweat (moderate-intensity exercise).  Most adults should also do strengthening exercises at least twice a week. This is in addition to the moderate-intensity exercise.  Maintain a healthy weight  Body mass index (BMI) is a measurement that can be used to identify possible weight problems. It estimates body fat based on height and weight. Your health care provider can help determine your BMI and help you achieve or maintain a healthy weight.  For females 234years of age and older:   A BMI below 18.5 is considered underweight.  A BMI of 18.5 to 24.9 is normal.  A BMI of 25 to  29.9 is considered overweight.  A BMI of 30 and above is considered obese.  Watch levels of cholesterol and blood lipids  You should start having your blood tested for lipids and cholesterol at 54years of age, then have this test every 5 years.  You may need to have your cholesterol levels checked more often if:  Your lipid or cholesterol levels are high.  You are older than 54years of age.  You are at high risk for heart disease.  CANCER SCREENING   Lung Cancer  Lung cancer screening is recommended for adults 542826years old who are at high risk for lung cancer because of a history of smoking.  A yearly low-dose CT scan of the lungs is recommended for people who:  Currently smoke.  Have quit within the past 15 years.  Have at least a 30-pack-year history of smoking. A pack year is smoking an average of one pack of cigarettes a day for 1 year.  Yearly screening should continue until it has been 15 years since you quit.  Yearly screening should stop if you develop a health problem that would prevent you from having lung cancer treatment.  Breast Cancer  Practice breast self-awareness. This means understanding how your breasts normally appear and feel.  It also means doing regular breast self-exams. Let your health care provider know about any changes, no matter how small.  If you are in your 20s or 30s, you should have a clinical  breast exam (CBE) by a health care provider every 1-3 years as part of a regular health exam.  If you are 9 or older, have a CBE every year. Also consider having a breast X-ray (mammogram) every year.  If you have a family history of breast cancer, talk to your health care provider about genetic screening.  If you are at high risk for breast cancer, talk to your health care provider about having an MRI and a mammogram every year.  Breast cancer gene (BRCA) assessment is recommended for women who have family members with BRCA-related  cancers. BRCA-related cancers include:  Breast.  Ovarian.  Tubal.  Peritoneal cancers.  Results of the assessment will determine the need for genetic counseling and BRCA1 and BRCA2 testing. Cervical Cancer Your health care provider may recommend that you be screened regularly for cancer of the pelvic organs (ovaries, uterus, and vagina). This screening involves a pelvic examination, including checking for microscopic changes to the surface of your cervix (Pap test). You may be encouraged to have this screening done every 3 years, beginning at age 47.  For women ages 74-65, health care providers may recommend pelvic exams and Pap testing every 3 years, or they may recommend the Pap and pelvic exam, combined with testing for human papilloma virus (HPV), every 5 years. Some types of HPV increase your risk of cervical cancer. Testing for HPV may also be done on women of any age with unclear Pap test results.  Other health care providers may not recommend any screening for nonpregnant women who are considered low risk for pelvic cancer and who do not have symptoms. Ask your health care provider if a screening pelvic exam is right for you.  If you have had past treatment for cervical cancer or a condition that could lead to cancer, you need Pap tests and screening for cancer for at least 20 years after your treatment. If Pap tests have been discontinued, your risk factors (such as having a new sexual partner) need to be reassessed to determine if screening should resume. Some women have medical problems that increase the chance of getting cervical cancer. In these cases, your health care provider may recommend more frequent screening and Pap tests. Colorectal Cancer  This type of cancer can be detected and often prevented.  Routine colorectal cancer screening usually begins at 54 years of age and continues through 54 years of age.  Your health care provider may recommend screening at an earlier  age if you have risk factors for colon cancer.  Your health care provider may also recommend using home test kits to check for hidden blood in the stool.  A small camera at the end of a tube can be used to examine your colon directly (sigmoidoscopy or colonoscopy). This is done to check for the earliest forms of colorectal cancer.  Routine screening usually begins at age 73.  Direct examination of the colon should be repeated every 5-10 years through 54 years of age. However, you may need to be screened more often if early forms of precancerous polyps or small growths are found. Skin Cancer  Check your skin from head to toe regularly.  Tell your health care provider about any new moles or changes in moles, especially if there is a change in a mole's shape or color.  Also tell your health care provider if you have a mole that is larger than the size of a pencil eraser.  Always use sunscreen. Apply sunscreen liberally and  repeatedly throughout the day.  Protect yourself by wearing long sleeves, pants, a wide-brimmed hat, and sunglasses whenever you are outside. HEART DISEASE, DIABETES, AND HIGH BLOOD PRESSURE   High blood pressure causes heart disease and increases the risk of stroke. High blood pressure is more likely to develop in:  People who have blood pressure in the high end of the normal range (130-139/85-89 mm Hg).  People who are overweight or obese.  People who are African American.  If you are 73-24 years of age, have your blood pressure checked every 3-5 years. If you are 2 years of age or older, have your blood pressure checked every year. You should have your blood pressure measured twice--once when you are at a hospital or clinic, and once when you are not at a hospital or clinic. Record the average of the two measurements. To check your blood pressure when you are not at a hospital or clinic, you can use:  An automated blood pressure machine at a pharmacy.  A home  blood pressure monitor.  If you are between 45 years and 41 years old, ask your health care provider if you should take aspirin to prevent strokes.  Have regular diabetes screenings. This involves taking a blood sample to check your fasting blood sugar level.  If you are at a normal weight and have a low risk for diabetes, have this test once every three years after 54 years of age.  If you are overweight and have a high risk for diabetes, consider being tested at a younger age or more often. PREVENTING INFECTION  Hepatitis B  If you have a higher risk for hepatitis B, you should be screened for this virus. You are considered at high risk for hepatitis B if:  You were born in a country where hepatitis B is common. Ask your health care provider which countries are considered high risk.  Your parents were born in a high-risk country, and you have not been immunized against hepatitis B (hepatitis B vaccine).  You have HIV or AIDS.  You use needles to inject street drugs.  You live with someone who has hepatitis B.  You have had sex with someone who has hepatitis B.  You get hemodialysis treatment.  You take certain medicines for conditions, including cancer, organ transplantation, and autoimmune conditions. Hepatitis C  Blood testing is recommended for:  Everyone born from [birth year] through 1964.  Anyone with known risk factors for hepatitis C. Sexually transmitted infections (STIs)  You should be screened for sexually transmitted infections (STIs) including gonorrhea and chlamydia if:  You are sexually active and are younger than 54 years of age.  You are older than 54 years of age and your health care provider tells you that you are at risk for this type of infection.  Your sexual activity has changed since you were last screened and you are at an increased risk for chlamydia or gonorrhea. Ask your health care provider if you are at risk.  If you do not have HIV, but are at  risk, it may be recommended that you take a prescription medicine daily to prevent HIV infection. This is called pre-exposure prophylaxis (PrEP). You are considered at risk if:  You are sexually active and do not regularly use condoms or know the HIV status of your partner(s).  You take drugs by injection.  You are sexually active with a partner who has HIV. Talk with your health care provider about whether you are at  high risk of being infected with HIV. If you choose to begin PrEP, you should first be tested for HIV. You should then be tested every 3 months for as long as you are taking PrEP.  PREGNANCY   If you are premenopausal and you may become pregnant, ask your health care provider about preconception counseling.  If you may become pregnant, take 400 to 800 micrograms (mcg) of folic acid every day.  If you want to prevent pregnancy, talk to your health care provider about birth control (contraception). OSTEOPOROSIS AND MENOPAUSE   Osteoporosis is a disease in which the bones lose minerals and strength with aging. This can result in serious bone fractures. Your risk for osteoporosis can be identified using a bone density scan.  If you are 52 years of age or older, or if you are at risk for osteoporosis and fractures, ask your health care provider if you should be screened.  Ask your health care provider whether you should take a calcium or vitamin D supplement to lower your risk for osteoporosis.  Menopause may have certain physical symptoms and risks.  Hormone replacement therapy may reduce some of these symptoms and risks. Talk to your health care provider about whether hormone replacement therapy is right for you.  HOME CARE INSTRUCTIONS   Schedule regular health, dental, and eye exams.  Stay current with your immunizations.   Do not use any tobacco products including cigarettes, chewing tobacco, or electronic cigarettes.  If you are pregnant, do not drink alcohol.  If  you are breastfeeding, limit how much and how often you drink alcohol.  Limit alcohol intake to no more than 1 drink per day for nonpregnant women. One drink equals 12 ounces of beer, 5 ounces of wine, or 1 ounces of hard liquor.  Do not use street drugs.  Do not share needles.  Ask your health care provider for help if you need support or information about quitting drugs.  Tell your health care provider if you often feel depressed.  Tell your health care provider if you have ever been abused or do not feel safe at home.   This information is not intended to replace advice given to you by your health care provider. Make sure you discuss any questions you have with your health care provider.   Document Released: 07/12/2010 Document Revised: 01/17/2014 Document Reviewed: 11/28/2012 Elsevier Interactive Patient Education [employer].

## 2015-08-25 NOTE — Assessment & Plan Note (Signed)
Pt notes worsening. Currently managed by psych. ?hyperthyroid contribution.  See below.

## 2015-08-25 NOTE — Addendum Note (Signed)
Addended by: Marchia Bond on: 08/25/2015 01:53 PM   Modules accepted: Orders

## 2015-08-25 NOTE — Assessment & Plan Note (Signed)
With significant thyrotoxicosis symptoms. Recheck TFTs and [place]. If remaining abnormal, low threshold to refer to endo. Heart rate regular today.

## 2015-08-25 NOTE — Assessment & Plan Note (Signed)
Chronic, stable. Continue current regiemn.

## 2015-08-25 NOTE — Assessment & Plan Note (Signed)
Preventative protocols reviewed and updated unless pt declined. Discussed healthy diet and lifestyle.  

## 2015-08-25 NOTE — Assessment & Plan Note (Signed)
Encouraged decreased added sugars.

## 2015-08-25 NOTE — Progress Notes (Signed)
BP 102/80   Pulse (!) 57   Ht 5' 1.75" (1.568 m)   Wt 171 lb (77.6 kg)   SpO2 97%   BMI 31.53 kg/m    CC: CPE Subjective:    Patient ID: Katelyn Price, female    DOB: 1960-07-13, 55 y.o.   MRN: [id number]  HPI: Katelyn Price is a 55 y.o. female presenting on 08/25/2015 for Annual Exam   Paternal thyroid disease. TSH low today - pt endorses heat intolerance, hot flashes, worsening persistent chronic tremor, mild diarrhea and some palpitations. No skin or hair changes.   Preventative: Well woman with OBGYN (Dr. Nori Riis), normal mammogram and pap smear per patient this year  COLONOSCOPY 06/2014 diverticulosis, rpt 10 yrs Collene Mares) Tdap - 06/2011  Flu shot - yearly Seat belt use discussed  Sunscreen use discussed, no changing moles Ex smoker - quit 2007 Alcohol use - none  Continues with periods.  Caffeine: drinks diet drinks  Lives with husband and 1 dog, 1 cat  Occupation: housewife  Activity: just got treadmill  Diet: good water, fruits/vegetables some   Relevant past medical, surgical, family and social history reviewed and updated as indicated. Interim medical history since our last visit reviewed. Allergies and medications reviewed and updated. Current Outpatient Prescriptions on File Prior to Visit  Medication Sig  . buPROPion (WELLBUTRIN XL) 150 MG 24 hr tablet Take 300 mg by mouth daily.   . busPIRone (BUSPAR) 15 MG tablet Take 7.5 mg by mouth 3 (three) times daily.   . Cholecalciferol (VITAMIN D) 2000 UNITS CAPS Take 1 capsule by mouth daily.   . citalopram (CELEXA) 10 MG tablet Take 10 mg by mouth daily.  . clindamycin (CLEOCIN T) 1 % lotion Apply 1 application topically daily.  Marland Kitchen estradiol (ESTRACE) 2 MG tablet Take 2 mg by mouth daily.  Marland Kitchen guanFACINE (TENEX) 1 MG tablet Take 1 mg by mouth 2 (two) times daily.  Marland Kitchen lithium carbonate (LITHOBID) 300 MG CR tablet Take 300 mg by mouth 2 (two) times daily.  . vitamin B-12 (CYANOCOBALAMIN) 500 MCG tablet Take 500 mcg  by mouth every Monday, Wednesday, and Friday.   . ziprasidone (GEODON) 80 MG capsule Take 240 mg by mouth at bedtime.    No current facility-administered medications on file prior to visit.     Review of Systems  Constitutional: Negative for activity change, appetite change, chills, fatigue, fever and unexpected weight change.  HENT: Negative for hearing loss.   Eyes: Negative for visual disturbance.  Respiratory: Negative for cough, chest tightness, shortness of breath and wheezing.   Cardiovascular: Positive for palpitations. Negative for chest pain and leg swelling.  Gastrointestinal: Positive for diarrhea. Negative for abdominal distention, abdominal pain, blood in stool, constipation, nausea and vomiting.  Genitourinary: Negative for difficulty urinating and hematuria.  Musculoskeletal: Negative for arthralgias, myalgias and neck pain.  Skin: Negative for rash.  Neurological: Positive for tremors and headaches (sinus). Negative for dizziness, seizures and syncope.  Hematological: Negative for adenopathy. Does not bruise/bleed easily.  Psychiatric/Behavioral: Negative for dysphoric mood. The patient is not nervous/anxious.    Per HPI unless specifically indicated in ROS section     Objective:    BP 102/80   Pulse (!) 57   Ht 5' 1.75" (1.568 m)   Wt 171 lb (77.6 kg)   SpO2 97%   BMI 31.53 kg/m   Wt Readings from Last 3 Encounters:  08/25/15 171 lb (77.6 kg)  10/13/14 173 lb (78.5  kg)  08/14/14 169 lb 4 oz (76.8 kg)    Physical Exam  Constitutional: She is oriented to person, place, and time. She appears well-developed and well-nourished. No distress.  HENT:  Head: Normocephalic and atraumatic.  Right Ear: Hearing, tympanic membrane, external ear and ear canal normal.  Left Ear: Hearing, tympanic membrane, external ear and ear canal normal.  Nose: Nose normal.  Mouth/Throat: Uvula is midline, oropharynx is clear and moist and mucous membranes are normal. No oropharyngeal  exudate, posterior oropharyngeal edema or posterior oropharyngeal erythema.  Eyes: Conjunctivae and EOM are normal. Pupils are equal, round, and reactive to light. No scleral icterus.  Neck: Normal range of motion. Neck supple. No thyromegaly (?R thyroid nodule) present.  Cardiovascular: Normal rate, regular rhythm, normal heart sounds and intact distal pulses.   No murmur heard. Pulses:      Radial pulses are 2+ on the right side, and 2+ on the left side.  Pulmonary/Chest: Effort normal and breath sounds normal. No respiratory distress. She has no wheezes. She has no rales.  Abdominal: Soft. Bowel sounds are normal. She exhibits no distension and no mass. There is no tenderness. There is no rebound and no guarding.  Musculoskeletal: Normal range of motion. She exhibits no edema.  Lymphadenopathy:    She has no cervical adenopathy.  Neurological: She is alert and oriented to person, place, and time.  CN grossly intact, station and gait intact Marked tremor present even at rest   Skin: Skin is warm and dry. No rash noted.  Psychiatric: She has a normal mood and affect. Her behavior is normal. Judgment and thought content normal.  Nursing note and vitals reviewed.  Results for orders placed or performed in visit on 08/25/15  HM MAMMOGRAPHY  Result Value Ref Range   HM Mammogram Self Reported Normal 0-4 Bi-Rad, Self Reported Normal  HM PAP SMEAR  Result Value Ref Range   HM Pap smear per patient normal       Assessment & Plan:   Problem List Items Addressed This Visit    B12 deficiency    S/p 1.5 yrs B12 shots. Now on 574mcg daily. Level remains high - decrease to MWF dosing.      Bipolar disorder [facility])    Followed by psychiatry Dr Lattie Haw.      GERD VIA EGD    Pt thinks she's taking protonix 40mg  daily. Refilled today.      Relevant Medications   pantoprazole (PROTONIX) 40 MG tablet   polyethylene glycol (MIRALAX / GLYCOLAX) packet   psyllium (METAMUCIL) 58.6 % packet    Healthcare maintenance - Primary    Preventative protocols reviewed and updated unless pt declined. Discussed healthy diet and lifestyle.       HYPERTENSION, BENIGN ESSENTIAL    Chronic, stable. Continue current regiemn.      Relevant Medications   lisinopril (PRINIVIL,ZESTRIL) 10 MG tablet   Hyperthyroidism    With significant thyrotoxicosis symptoms. Recheck TFTs and [place]. If remaining abnormal, low threshold to refer to endo. Heart rate regular today.       Relevant Orders   US Soft Tissue Head/Neck   TSH   T3   T4, free   Thyroid antibodies   Impaired fasting glucose    Encouraged decreased added sugars.      Tremor due to multiple drugs    Pt notes worsening. Currently managed by psych. ?hyperthyroid contribution.  See below.       Other Visit Diagnoses   None.  Follow up plan: Return in about 1 year (around 08/24/2016), or as needed, for annual exam, prior fasting for blood work.  Ria Bush, MD

## 2015-08-25 NOTE — Assessment & Plan Note (Signed)
Pt thinks she's taking protonix 40mg  daily. Refilled today.

## 2015-08-25 NOTE — Addendum Note (Signed)
Addended by: Marchia Bond on: 08/25/2015 02:01 PM   Modules accepted: Orders

## 2015-08-25 NOTE — Assessment & Plan Note (Signed)
S/p 1.5 yrs B12 shots. Now on 524mcg daily. Level remains high - decrease to MWF dosing.

## 2015-08-25 NOTE — Progress Notes (Signed)
Pre visit review using our clinic review tool, if applicable. No additional management support is needed unless otherwise documented below in the visit note. 

## 2015-08-25 NOTE — Assessment & Plan Note (Signed)
Followed by psychiatry Dr Lattie Haw.

## 2015-08-26 LAB — T3: T3 TOTAL: 253 ng/dL — AB (ref 76–181)

## 2015-08-28 ENCOUNTER — Other Ambulatory Visit: Payer: Self-pay | Admitting: Family Medicine

## 2015-08-28 ENCOUNTER — Encounter: Payer: Self-pay | Admitting: Family Medicine

## 2015-08-28 DIAGNOSIS — E0590 Thyrotoxicosis, unspecified without thyrotoxic crisis or storm: Secondary | ICD-10-CM

## 2015-08-30 LAB — THYROTROPIN RECEPTOR AUTOABS: THYROTROPIN RECEPTOR AB: 41.4 % — AB (ref <=16.0)

## 2015-08-31 ENCOUNTER — Other Ambulatory Visit: Payer: 59

## 2015-09-04 ENCOUNTER — Ambulatory Visit
Admission: RE | Admit: 2015-09-04 | Discharge: 2015-09-04 | Disposition: A | Discharge status: Home / Self Care | Payer: 59 | Source: Ambulatory Visit | Attending: Family Medicine | Admitting: Family Medicine

## 2015-09-04 DIAGNOSIS — E0590 Thyrotoxicosis, unspecified without thyrotoxic crisis or storm: Secondary | ICD-10-CM

## 2015-09-23 ENCOUNTER — Telehealth: Payer: Self-pay

## 2015-09-23 MED ORDER — METHIMAZOLE 5 MG PO TABS: 5.0000 mg | ORAL_TABLET | Freq: Two times a day (BID) | ORAL | 1 refills | Status: DC

## 2015-09-23 NOTE — Telephone Encounter (Signed)
plz notify methimazole sent to pharmacy. Take 5mg  once daily for 4 days then increase to BID. Update [place] with effect after 1 wk.

## 2015-09-23 NOTE — Telephone Encounter (Signed)
Pt said her tremors are worsening and having fast heart beat, h/a and trouble breathing.pt said when received lab results was told a med could be called in (methimazole). Pt has tried to get sooner appt with Dr Cruzita Lederer but none available. Pt has appt to see Dr Cruzita Lederer 10/14/15. [facility]. Pt request cb.

## 2015-09-23 NOTE — Telephone Encounter (Signed)
Patient notified and verbalized understanding. 

## 2015-09-29 NOTE — Telephone Encounter (Signed)
Pt left v/m that pt was doing real good since taking the methimazole. I spoke with pt and she is still having some tremors and feels like heart is beating fast.Some trouble breathing if heart beats fast but no CP or arm pain now.pt wants to know if could increase the methimazole.pt request cb.

## 2015-09-30 MED ORDER — METHIMAZOLE 5 MG PO TABS: 5.0000 mg | ORAL_TABLET | Freq: Three times a day (TID) | ORAL | 0 refills | Status: DC

## 2015-09-30 NOTE — Telephone Encounter (Signed)
I believe she should be on BID dosing at this time. Lets increase methimazole to 5mg  three times daily. Update with effect after 1 wk.

## 2015-09-30 NOTE — Addendum Note (Signed)
Addended by: Ria Bush on: 09/30/2015 07:27 AM   Modules accepted: Orders

## 2015-09-30 NOTE — Telephone Encounter (Signed)
Pt confirmed that she is taking BID. She was notified as instructed to increase to TID. She verbalized understanding and will call in 1 week with update.

## 2015-10-07 MED ORDER — METHIMAZOLE 10 MG PO TABS: 10.0000 mg | ORAL_TABLET | Freq: Two times a day (BID) | ORAL | 3 refills | Status: DC

## 2015-10-07 NOTE — Telephone Encounter (Signed)
Ok let's increase methimazole to 10mg  BID - take 2 tab BID until runs out, new dose will be at pharmacy.  Thanks

## 2015-10-07 NOTE — Telephone Encounter (Signed)
Pt left v/m with update; pt is still having tremors and occasional SOB; other symptoms(fast heart beat and H/A are gone.Please advise.

## 2015-10-07 NOTE — Telephone Encounter (Signed)
Patient notified and verbalized understanding. 

## 2015-10-07 NOTE — Addendum Note (Signed)
Addended by: Ria Bush on: 10/07/2015 03:58 PM   Modules accepted: Orders

## 2015-10-14 ENCOUNTER — Ambulatory Visit: Payer: 59 | Admitting: Internal Medicine

## 2015-10-14 ENCOUNTER — Encounter: Payer: Self-pay | Admitting: Internal Medicine

## 2015-10-14 ENCOUNTER — Ambulatory Visit (INDEPENDENT_AMBULATORY_CARE_PROVIDER_SITE_OTHER): Payer: 59 | Admitting: Internal Medicine

## 2015-10-14 VITALS — BP 118/82 | HR 59 | Ht 62.5 in | Wt 169.0 lb

## 2015-10-14 DIAGNOSIS — E042 Nontoxic multinodular goiter: Secondary | ICD-10-CM | POA: Diagnosis not present

## 2015-10-14 DIAGNOSIS — E0590 Thyrotoxicosis, unspecified without thyrotoxic crisis or storm: Secondary | ICD-10-CM

## 2015-10-14 LAB — T4, FREE: FREE T4: 1.56 ng/dL (ref 0.60–1.60)

## 2015-10-14 LAB — T3, FREE: T3 FREE: 5 pg/mL — AB (ref 2.3–4.2)

## 2015-10-14 LAB — TSH: TSH: 0.32 u[IU]/mL — ABNORMAL LOW (ref 0.35–4.50)

## 2015-10-14 NOTE — Patient Instructions (Addendum)
Please stop at the lab.  Continue Methimazole 10 mg 2x a day.  We will schedule a Thyroid Uptake and Scan.  Please make sure you stop the Methimazole 4 days before the scan and resume it after the scan.  Please come back for a follow-up appointment in 3 months.   Hyperthyroidism Hyperthyroidism is when the thyroid is too active (overactive). Your thyroid is a large gland that is located in your neck. The thyroid helps to control how your body uses food (metabolism). When your thyroid is overactive, it produces too much of a hormone called thyroxine.  CAUSES Causes of hyperthyroidism may include:  Graves disease. This is when your immune system attacks the thyroid gland. This is the most common cause.  Inflammation of the thyroid gland.  Tumor in the thyroid gland or somewhere else.  Excessive use of thyroid medicines, including:  Prescription thyroid supplement.  Herbal supplements that mimic thyroid hormones.  Solid or fluid-filled lumps within your thyroid gland (thyroid nodules).  Excessive ingestion of iodine. RISK FACTORS  Being female.  Having a family history of thyroid conditions. SIGNS AND SYMPTOMS Signs and symptoms of hyperthyroidism may include:  Nervousness.  Inability to tolerate heat.  Unexplained weight loss.  Diarrhea.  Change in the texture of hair or skin.  Heart skipping beats or making extra beats.  Rapid heart rate.  Loss of menstruation.  Shaky hands.  Fatigue.  Restlessness.  Increased appetite.  Sleep problems.  Enlarged thyroid gland or nodules. DIAGNOSIS  Diagnosis of hyperthyroidism may include:  Medical history and physical exam.  Blood tests.  Ultrasound tests. TREATMENT Treatment may include:  Medicines to control your thyroid.  Surgery to remove your thyroid.  Radiation therapy. HOME CARE INSTRUCTIONS   Take medicines only as directed by your health care provider.  Do not use any tobacco products,  including cigarettes, chewing tobacco, or electronic cigarettes. If you need help quitting, ask your health care provider.  Do not exercise or do physical activity until your health care provider approves.  Keep all follow-up appointments as directed by your health care provider. This is important. SEEK MEDICAL CARE IF:  Your symptoms do not get better with treatment.  You have fever.  You are taking thyroid replacement medicine and you:  Have depression.  Feel mentally and physically slow.  Have weight gain. SEEK IMMEDIATE MEDICAL CARE IF:   You have decreased alertness or a change in your awareness.  You have abdominal pain.  You feel dizzy.  You have a rapid heartbeat.  You have an irregular heartbeat.   This information is not intended to replace advice given to you by your health care provider. Make sure you discuss any questions you have with your health care provider.   Document Released: 12/27/2004 Document Revised: 01/17/2014 Document Reviewed: 05/14/2013 Elsevier Interactive Patient Education [employer].

## 2015-10-14 NOTE — Progress Notes (Signed)
Patient ID: Katelyn Price, female   DOB: 12-Jul-1960, 55 y.o.   MRN: [id number]    HPI  Katelyn Price is a 55 y.o.-year-old female, referred by her PCP, Dr. Danise Price, for evaluation for thyrotoxicosis and thyroid nodules. She is here with her husband who offers part of the history.  I reviewed pt's thyroid tests: Component     Latest Ref Rng & Units 08/25/2015  TSH     0.35 - 4.50 uIU/mL 0.03 (L)  T4,Free(Direct)     0.60 - 1.60 ng/dL 1.55  Triiodothyronine (T3)     76 - 181 ng/dL 253.0 (H)   Lab Results  Component Value Date   TSH 0.03 (L) 08/25/2015   TSH 0.02 (L) 08/21/2015   TSH 3.01 08/11/2014   TSH 2.36 07/26/2013   TSH 3.52 07/26/2012   TSH 1.61 06/16/2011   TSH 2.79 06/14/2010   TSH 1.79 05/04/2009   TSH 1.69 06/05/2007   TSH 3.27 05/30/2006   FREET4 1.55 08/25/2015   FREET4 0.85 06/14/2010   She had elevated thyrotropin receptor antibodies: Component     Latest Ref Rng & Units 08/25/2015  Thyrotropin Receptor Ab     <=16.0 % 41.4 (H)   She also had a thyroid ultrasound on 09/04/2015: Right thyroid lobe: 4.1 cm x 1.7 cm x 1.9 cm. Relatively homogeneous appearance of right thyroid tissue.  Right thyroid nodule #1 measures 0.7 cm x 0.7 cm x 0.7 cm mixed cystic and solid composition (1), with hypoechoic  echogenicity (2), taller than wide shape (3) Left thyroid lobe: 5.1 cm x 1.9 cm x 2.4 cm. Relatively homogeneous appearance of the left thyroid.  *Inferior left nodule #1 measures 1.5 cm x 1.1 cm x 1.3 cm Nearly completely solid composition (2), with isoechoic  echogenicity (1).    There are 3 additional separate nodules all measuring less than 5 mm with characteristics compatible with colloid cysts. Isthmus Thickness: 0.4 cm.  No nodules visualized. Lymphadenopathy: None visualized.  She was started on Methimazole 10 mg 2x a day ~1 mo ago >> feeling better.  On Propranolol (Inderal 80 mg at bedtime) in last 3 mo (for tremors).  Pt denies feeling nodules  in neck, hoarseness, dysphagia/odynophagia, SOB with lying down; she c/o: - + excessive sweating/heat intolerance >> now better on MMI - no fatigue - + increased tremors - + more SOB on exertion - + anxiety - + palpitations - + hyperdefecation >> now better on MMI - no weight loss - no hair loss  Pt does have a FH of thyroid ds on father's side of the family. No FH of thyroid cancer. No h/o radiation tx to head or neck.  No seaweed or kelp, no recent contrast studies. No steroid use. No herbal supplements. No Biotin use.  Pt. also has a history of GERD, psoriasis, bipolar dis., HTN, B12 def.  ROS: Constitutional: + see HPI Eyes: + blurry vision, no xerophthalmia ENT: no sore throat, no nodules palpated in throat, no dysphagia/odynophagia, no hoarseness Cardiovascular: no CP/SOB/palpitations/leg swelling Respiratory: no cough/SOB Gastrointestinal: + N/no V/D/C/+ heartburn Musculoskeletal: no muscle/joint aches Skin: no rashes, + easy bruising Neurological: no tremors/numbness/tingling/dizziness, + HA (chronic) Psychiatric: + depression/+ anxiety + low libido  Past Medical History:  Diagnosis Date  . Abnormal involuntary movements(781.0)   . Acne   . Allergic rhinitis   . Bipolar disorder, unspecified    psych - Dr. Lattie Price  . Diaphragmatic hernia without mention of obstruction or gangrene   .  GERD (gastroesophageal reflux disease)    03/18/02, EGD, [facility] Gerd,gastritis, dilated  . History of MRI of brain and brain stem 06/01/03   [facility]l  . Hypertension   . Other psoriasis   . Unspecified sinusitis (chronic)    Past Surgical History:  Procedure Laterality Date  . COLONOSCOPY  03/18/02   internal hemorrhoids  . COLONOSCOPY  06/2014   diverticulosis, rpt 10 yrs Katelyn Price)  . ESOPHAGOGASTRODUODENOSCOPY  1/99   Katelyn Price; HH,GERD  . ESOPHAGOGASTRODUODENOSCOPY  03/18/02   HH,GERD,Gastritis; Dilated  . LAPAROSCOPIC ESOPHAGOGASTRIC FUNDOPLASTY  08/28/02   Katelyn Price   Social History    Social History  . Marital status: Married    Spouse name: N/A  . Number of children: 0  . Years of education: N/A   Occupational History  . Housewife    Social History Main Topics  . Smoking status: Former Smoker    Packs/day: 1.00    Years: 20.00    Types: Cigarettes    Quit date: 01/10/1998  . Smokeless tobacco: Never Used  . Alcohol use No  . Drug use: No   Social History Narrative   Caffeine: drinks diet drinks   Lives with husband and 1 dog, 1 cat   Occupation: housewife   Activity: no regular exercise, doesn't feel safe to walk outside at home, could walk on mother's treadmill   Diet: good water, fruits/vegetables some    Current Outpatient Prescriptions on File Prior to Visit  Medication Sig Dispense Refill  . buPROPion (WELLBUTRIN XL) 150 MG 24 hr tablet Take 300 mg by mouth daily.     . busPIRone (BUSPAR) 15 MG tablet Take 7.5 mg by mouth 3 (three) times daily.     . Cholecalciferol (VITAMIN D) 2000 UNITS CAPS Take 1 capsule by mouth daily.     . citalopram (CELEXA) 10 MG tablet Take 10 mg by mouth daily.    . clindamycin (CLEOCIN T) 1 % lotion Apply 1 application topically daily.    Marland Kitchen estradiol (ESTRACE) 2 MG tablet Take 2 mg by mouth daily.    Marland Kitchen lisinopril (PRINIVIL,ZESTRIL) 10 MG tablet TAKE 1 TABLET (10 MG TOTAL) BY MOUTH DAILY. 90 tablet 3  . methimazole (TAPAZOLE) 10 MG tablet Take 1 tablet (10 mg total) by mouth 2 (two) times daily. First 4 days take 1 tablet daily 60 tablet 3  . pantoprazole (PROTONIX) 40 MG tablet Take 1 tablet (40 mg total) by mouth daily. 90 tablet 3  . psyllium (METAMUCIL) 58.6 % packet Take 1 packet by mouth daily.    . vitamin B-12 (CYANOCOBALAMIN) 500 MCG tablet Take 500 mcg by mouth every Monday, Wednesday, and Friday.     . ziprasidone (GEODON) 80 MG capsule Take 240 mg by mouth at bedtime.     Marland Kitchen guanFACINE (TENEX) 1 MG tablet Take 1 mg by mouth 2 (two) times daily.    Marland Kitchen lithium carbonate (LITHOBID) 300 MG CR tablet Take 300 mg by  mouth 2 (two) times daily.    . polyethylene glycol (MIRALAX / GLYCOLAX) packet Take 17 g by mouth daily.     No current facility-administered medications on file prior to visit.    Allergies  Allergen Reactions  . Hydrocodone-Homatropine     REACTION: unspecified  . Minocycline Hcl     REACTION: Itching   Family History  Problem Relation Age of Onset  . Arthritis Mother     fibromyalgia  . Alcohol abuse Father   . Emphysema Brother  smoker  . COPD Brother     smoker, continues.  . Arthritis Sister     fibromyalgia  . Cancer Neg Hx   . Coronary artery disease Neg Hx   . Stroke Neg Hx   . Diabetes Neg Hx    PE: BP 118/82 (BP Location: Left Arm, Patient Position: Sitting)   Pulse (!) 59   Ht 5' 2.5" (1.588 m)   Wt 169 lb (76.7 kg)   SpO2 98%   BMI 30.42 kg/m   Wt Readings from Last 3 Encounters:  10/14/15 169 lb (76.7 kg)  08/25/15 171 lb (77.6 kg)  10/13/14 173 lb (78.5 kg)   Constitutional: overweight, in NAD, very tremulous during visit Eyes: PERRLA, EOMI, no exophthalmos, no lid lag, no stare ENT: moist mucous membranes, no thyromegaly, no thyroid bruits, no cervical lymphadenopathy Cardiovascular: RRR, No MRG Respiratory: CTA B Gastrointestinal: abdomen soft, NT, ND, BS+ Musculoskeletal: no deformities, strength intact in all 4 Skin: moist, warm, no rashes Neurological: +++ tremor with outstretched hands, DTR brisk: +3/5, in all 4  ASSESSMENT: 1. Thyrotoxicosis  2. Thyroid nodules  PLAN:  1. Patient with recently found thyrotoxicosis, with thyrotoxic sxs: tremors (she had Li-induced tremors, but exacerbated lately), heat intolerance, hyperdefecation, palpitations, SOB. The sxs improved after starting MMI 1 mo ago. She is taking 10 mg 2x a day. No SEs.  - she does not appear to have exogenous causes for the low TSH.  - We discussed that possible causes of thyrotoxicosis are:  Thyroiditis (no recent URI, flu) Graves ds  (she has positive TRAb) toxic  multinodular goiter/ toxic adenoma (thyroid nodules present on U/S). - I suggested that we check the TSH, fT3 and fT4 and also add thyroid stimulating antibodies and will then change the MMI dose accordingly - we will then need an uptake and scan to differentiate between the 3 above possible etiologies and to see if any of her thyroid nodules are "hot" or "cold".  - we discussed about possible modalities of treatment for the above conditions, to include methimazole use, radioactive iodine ablation or (last resort) surgery. - she is on beta blocker for tremors >> continue - RTC in 3 months, but likely sooner for repeat labs  2. Thyroid nodules - dominant nodule: 1.5 cm, iso-slightly hypoechoic, with internal blood flow. This may be an inflammatory or toxic nodule. In this case, no biopsies needed. I discussed with the patient that, depending on the results of the uptake and scan, we may need to postpone evaluation of this nodule until after hyperthyroidism is resolved, in case this is an inflammatory nodule. - We'll await the results of the uptake and scan   Orders Placed This Encounter  Procedures  . NM THYROID SNG UPTAKE W/IMAGING  . T4, free  . T3, free  . TSH  . Thyroid Stimulating Immunoglobulin   Component     Latest Ref Rng & Units 10/14/2015  TSH     0.35 - 4.50 uIU/mL 0.32 (L)  T4,Free(Direct)     0.60 - 1.60 ng/dL 1.56  Triiodothyronine,Free,Serum     2.3 - 4.2 pg/mL 5.0 (H)  TSI     <140 % baseline 630 (H)  Labs consistent with Graves' disease, improved on methimazole. We'll continue the current dose for now.  NM THYROID SNG UPTAKE W/IMAGING  Order: [id number]  Status:  Final result Visible to patient:  No (Not Released) Dx:  Hyperthyroidism; Multiple thyroid nod...  Details   Reading Physician Reading Date Result Priority  Kerby Moors, MD 10/22/2015   Narrative    CLINICAL DATA: Hyperthyroidism.  EXAM: THYROID SCAN AND UPTAKE - 24 HOURS  TECHNIQUE: Following  the per oral administration of I-131 sodium iodide, the patient returned at 24 hours and uptake measurements were acquired with the uptake probe centered on the neck. Thyroid imaging was performed following the intravenous administration of the Tc-56m Pertechnetate.  RADIOPHARMACEUTICALS: 7.1 MicroCuries I-131 sodium iodide orally and 9.9 mCi Technetium-87m pertechnetate IV  COMPARISON: None  FINDINGS: The 24 hour radioactive iodine uptake is equal to 36.9%.  There is diffuse radiotracer uptake identified within both lobes of the thyroid gland. No dominant hot or cold nodule.  IMPRESSION: Elevated 24 hour radioactive iodine uptake. The patient's hyperthyroidism is likely due to Grave's disease and may be amendable to therapy with radial labeled iodine.   Electronically Signed By: Kerby Moors M.D. On: 10/22/2015 17:30       Uptake and scan confirms Graves' disease. Continue methimazole. Of note, there are no cold nodules.  Philemon Kingdom, MD PhD [facility]

## 2015-10-20 LAB — THYROID STIMULATING IMMUNOGLOBULIN: TSI: 630 %{baseline} — AB (ref <140)

## 2015-10-21 ENCOUNTER — Encounter (HOSPITAL_COMMUNITY): Payer: Self-pay | Admitting: Radiology

## 2015-10-21 ENCOUNTER — Encounter (HOSPITAL_COMMUNITY)
Admission: RE | Admit: 2015-10-21 | Discharge: 2015-10-21 | Disposition: A | Discharge status: Home / Self Care | Payer: 59 | Source: Ambulatory Visit | Attending: Internal Medicine | Admitting: Internal Medicine

## 2015-10-21 DIAGNOSIS — E0590 Thyrotoxicosis, unspecified without thyrotoxic crisis or storm: Secondary | ICD-10-CM | POA: Insufficient documentation

## 2015-10-21 DIAGNOSIS — E042 Nontoxic multinodular goiter: Secondary | ICD-10-CM | POA: Insufficient documentation

## 2015-10-21 MED ORDER — SODIUM IODIDE I 131 CAPSULE
7.1000 | Freq: Once | INTRAVENOUS | Status: AC | PRN
  Administered 2015-10-21: 7.1 via ORAL

## 2015-10-22 ENCOUNTER — Encounter (HOSPITAL_COMMUNITY)
Admission: RE | Admit: 2015-10-22 | Discharge: 2015-10-22 | Disposition: A | Discharge status: Home / Self Care | Payer: 59 | Source: Ambulatory Visit | Attending: Internal Medicine | Admitting: Internal Medicine

## 2015-10-22 DIAGNOSIS — E0590 Thyrotoxicosis, unspecified without thyrotoxic crisis or storm: Secondary | ICD-10-CM | POA: Diagnosis present

## 2015-10-22 DIAGNOSIS — E042 Nontoxic multinodular goiter: Secondary | ICD-10-CM | POA: Diagnosis not present

## 2015-10-22 MED ORDER — SODIUM PERTECHNETATE TC 99M INJECTION
9.9000 | Freq: Once | INTRAVENOUS | Status: AC | PRN
  Administered 2015-10-22: 9.9 via INTRAVENOUS

## 2015-11-20 ENCOUNTER — Other Ambulatory Visit: Payer: Self-pay | Admitting: *Deleted

## 2015-11-20 MED ORDER — METHIMAZOLE 10 MG PO TABS: 10.0000 mg | ORAL_TABLET | Freq: Two times a day (BID) | ORAL | 1 refills | Status: DC

## 2016-01-14 ENCOUNTER — Encounter: Payer: Self-pay | Admitting: Internal Medicine

## 2016-01-14 ENCOUNTER — Ambulatory Visit (INDEPENDENT_AMBULATORY_CARE_PROVIDER_SITE_OTHER): Payer: 59 | Admitting: Internal Medicine

## 2016-01-14 VITALS — BP 110/62 | HR 53 | Ht 63.0 in | Wt 179.0 lb

## 2016-01-14 DIAGNOSIS — E042 Nontoxic multinodular goiter: Secondary | ICD-10-CM | POA: Diagnosis not present

## 2016-01-14 DIAGNOSIS — E0500 Thyrotoxicosis with diffuse goiter without thyrotoxic crisis or storm: Secondary | ICD-10-CM | POA: Diagnosis not present

## 2016-01-14 LAB — T4, FREE: FREE T4: 0.61 ng/dL (ref 0.60–1.60)

## 2016-01-14 LAB — TSH: TSH: 0.12 u[IU]/mL — ABNORMAL LOW (ref 0.35–4.50)

## 2016-01-14 LAB — T3, FREE: T3 FREE: 3.4 pg/mL (ref 2.3–4.2)

## 2016-01-14 NOTE — Progress Notes (Signed)
Patient ID: Katelyn Price, female   DOB: [birth year], 56 y.o.   MRN: [id number]    HPI  Katelyn Price is a 56 y.o.-year-old female, initially referred by her PCP, Dr. Danise Mina, for evaluation for thyrotoxicosis and thyroid nodules. She is here with her husband who offers part of the history. Last visit 10/14/2015.  I reviewed pt's thyroid tests: Lab Results  Component Value Date   TSH 0.32 (L) 10/14/2015   TSH 0.03 (L) 08/25/2015   TSH 0.02 (L) 08/21/2015   TSH 3.01 08/11/2014   TSH 2.36 07/26/2013   TSH 3.52 07/26/2012   TSH 1.61 06/16/2011   TSH 2.79 06/14/2010   TSH 1.79 05/04/2009   TSH 1.69 06/05/2007   FREET4 1.56 10/14/2015   FREET4 1.55 08/25/2015   FREET4 0.85 06/14/2010   She had elevated thyrotropin receptor antibodies: Component     Latest Ref Rng & Units 08/25/2015  Thyrotropin Receptor Ab     <=16.0 % 41.4 (H)   Also, elevated TSIs: Lab Results  Component Value Date   TSI 630 (H) 10/14/2015   She also had a thyroid ultrasound on 09/04/2015: Right thyroid lobe: 4.1 cm x 1.7 cm x 1.9 cm. Relatively homogeneous appearance of right thyroid tissue.  Right thyroid nodule #1 measures 0.7 cm x 0.7 cm x 0.7 cm mixed cystic and solid composition (1), with hypoechoic  echogenicity (2), taller than wide shape (3) Left thyroid lobe: 5.1 cm x 1.9 cm x 2.4 cm. Relatively homogeneous appearance of the left thyroid.  *Inferior left nodule #1 measures 1.5 cm x 1.1 cm x 1.3 cm Nearly completely solid composition (2), with isoechoic echogenicity (1).   There are 3 additional separate nodules all measuring less than 5 mm with characteristics compatible with colloid cysts. Isthmus Thickness: 0.4 cm.  No nodules visualized. Lymphadenopathy: None visualized.  We obtained a Thyroid Uptake and scan (10/22/2015) after last visit and this was c/w Graves ds.: The 24 hour radioactive iodine uptake is equal to 36.9%.There is diffuse radiotracer uptake identified within both lobes of  the thyroid gland. No dominant hot or cold nodule.   She was started on Methimazole 10 mg 2x a day before our last visit. We continued same dose. She is feeling better on MMI.  She is also on Propranolol (Inderal 80 mg at bedtime) for tremors. She is on Lithium for bipolar disorder.  Pt denies feeling nodules in neck, hoarseness, dysphagia/odynophagia, SOB with lying down; she mentions: - resolved excessive sweating/heat intolerance  - no fatigue - + increased tremors - no more SOB on exertion - improved anxiety - resolved palpitations - no hyperdefecation - no weight loss, + weight gain - 10 lbs since last visit - no hair loss, maybe thinner  Pt does have a FH of thyroid ds on father's side of the family. No FH of thyroid cancer. No h/o radiation tx to head or neck.  No seaweed or kelp, no recent contrast studies. No steroid use. No herbal supplements. No Biotin use.  Pt. also has a history of GERD, psoriasis, bipolar dis., HTN, B12 def.  ROS: Constitutional: + see HPI Eyes: no blurry vision, no xerophthalmia ENT: no sore throat, no nodules palpated in throat, no dysphagia/odynophagia, no hoarseness Cardiovascular: no CP/SOB/palpitations/leg swelling Respiratory: no cough/SOB Gastrointestinal: no N/V/D/C/heartburn Musculoskeletal: no muscle/joint aches Skin: no rashes Neurological: + tremors (on Li)/numbness/tingling/dizziness, + HA (chronic) + anxiety  I reviewed pt's medications, allergies, PMH, social hx, family hx, and changes were documented  in the history of present illness. Otherwise, unchanged from my initial visit note.  Past Medical History:  Diagnosis Date  . Abnormal involuntary movements(781.0)   . Acne   . Allergic rhinitis   . Bipolar disorder, unspecified    psych - Dr. Lattie Haw  . Diaphragmatic hernia without mention of obstruction or gangrene   . GERD (gastroesophageal reflux disease)    03/18/02, EGD, [facility] Gerd,gastritis, dilated  . History of MRI of  brain and brain stem 06/01/03   wnl  . Hypertension   . Other psoriasis   . Unspecified sinusitis (chronic)    Past Surgical History:  Procedure Laterality Date  . COLONOSCOPY  03/18/02   internal hemorrhoids  . COLONOSCOPY  06/2014   diverticulosis, rpt 10 yrs Collene Mares)  . ESOPHAGOGASTRODUODENOSCOPY  1/99   Sharlett Iles; HH,GERD  . ESOPHAGOGASTRODUODENOSCOPY  03/18/02   HH,GERD,Gastritis; Dilated  . LAPAROSCOPIC ESOPHAGOGASTRIC FUNDOPLASTY  08/28/02   Hassell Done   Social History   Social History  . Marital status: Married    Spouse name: N/A  . Number of children: 0   Occupational History  . Housewife    Social History Main Topics  . Smoking status: Former Smoker    Packs/day: 1.00    Years: 20.00    Types: Cigarettes    Quit date: 01/10/1998  . Smokeless tobacco: Never Used  . Alcohol use No  . Drug use: No   Social History Narrative   Caffeine: drinks diet drinks   Lives with husband and 1 dog, 1 cat   Occupation: housewife   Activity: no regular exercise, doesn't feel safe to walk outside at home, could walk on mother's treadmill   Diet: good water, fruits/vegetables some    Current Outpatient Prescriptions on File Prior to Visit  Medication Sig Dispense Refill  . buPROPion (WELLBUTRIN XL) 150 MG 24 hr tablet Take 150 mg by mouth daily.     . busPIRone (BUSPAR) 15 MG tablet Take 7.5 mg by mouth 3 (three) times daily.     . Ca & Phos-Vit D-Mag (CALCIUM) [phone] TABS Take by mouth.    . Cholecalciferol (VITAMIN D) 2000 UNITS CAPS Take 1 capsule by mouth daily.     . citalopram (CELEXA) 10 MG tablet Take 20 mg by mouth daily.     . clindamycin (CLEOCIN T) 1 % lotion Apply 1 application topically daily.    Marland Kitchen estradiol (ESTRACE) 2 MG tablet Take 2 mg by mouth daily.    Marland Kitchen lisinopril (PRINIVIL,ZESTRIL) 10 MG tablet TAKE 1 TABLET (10 MG TOTAL) BY MOUTH DAILY. 90 tablet 3  . lithium carbonate (LITHOBID) 300 MG CR tablet Take 300 mg by mouth 2 (two) times daily.    .  methimazole (TAPAZOLE) 10 MG tablet Take 1 tablet (10 mg total) by mouth 2 (two) times daily. 180 tablet 1  . pantoprazole (PROTONIX) 40 MG tablet Take 1 tablet (40 mg total) by mouth daily. 90 tablet 3  . polyethylene glycol (MIRALAX / GLYCOLAX) packet Take 17 g by mouth daily.    . psyllium (METAMUCIL) 58.6 % packet Take 1 packet by mouth daily.    Marland Kitchen rOPINIRole (REQUIP) 1 MG tablet Take 1 mg by mouth 3 (three) times daily.    . vitamin B-12 (CYANOCOBALAMIN) 500 MCG tablet Take 2,000 mcg by mouth daily.     . ziprasidone (GEODON) 80 MG capsule Take 240 mg by mouth at bedtime.     Marland Kitchen guanFACINE (TENEX) 1 MG tablet Take 1 mg by mouth  2 (two) times daily.     No current facility-administered medications on file prior to visit.    Allergies  Allergen Reactions  . Hydrocodone-Homatropine     REACTION: unspecified  . Minocycline Hcl     REACTION: Itching   Family History  Problem Relation Age of Onset  . Arthritis Mother     fibromyalgia  . Alcohol abuse Father   . Emphysema Brother     smoker  . COPD Brother     smoker, continues.  . Arthritis Sister     fibromyalgia  . Cancer Neg Hx   . Coronary artery disease Neg Hx   . Stroke Neg Hx   . Diabetes Neg Hx    PE: BP 110/62 (BP Location: Right Arm, Patient Position: Sitting)   Pulse (!) 53   Ht 5\' 3"  (1.6 m)   Wt 179 lb (81.2 kg)   SpO2 97%   BMI 31.71 kg/m  Wt Readings from Last 3 Encounters:  01/14/16 179 lb (81.2 kg)  10/14/15 169 lb (76.7 kg)  08/25/15 171 lb (77.6 kg)   Constitutional: overweight, in NAD Eyes: PERRLA, EOMI, no exophthalmos, no lid lag, no stare ENT: moist mucous membranes, no thyromegaly, no cervical lymphadenopathy Cardiovascular: RRR, No MRG Respiratory: CTA B Gastrointestinal: abdomen soft, NT, ND, BS+ Musculoskeletal: no deformities, strength intact in all 4 Skin: moist, warm, no rashes Neurological: +++ tremor with outstretched hands, DTR brisk: +3/5, in all 4  ASSESSMENT: 1.  Thyrotoxicosis  2. Thyroid nodules  PLAN:  1. Patient with thyrotoxicosis dx'ed summer 2017, with thyrotoxic sxs: tremors (she had Li-induced tremors, but exacerbated 2/2 hyperthyroidism), heat intolerance, hyperdefecation, palpitations, SOB. The sxs improved after starting MMI >> we continued 10 mg 2x a day at last visit as tests were better, but still slightly abnormal.  - We reviewed her uptake and scan and explained that this confirms Graves' disease. We again discussed about possible modalities of treatment for the above conditions, to include methimazole use, radioactive iodine ablation or (last resort) surgery. She agrees to continue with methimazole for now, since she is responding well to the medication. - Today we will check the TSH, fT3 and fT4 and will adjust MMI dose accordingly. I do suspect that we'll need to decrease the dose. - she is on beta blocker for tremors >> continue - RTC in 4 months, but likely sooner for repeat labs  2. Thyroid nodules - dominant nodule: 1.5 cm, iso-slightly hypoechoic, with internal blood flow. The nodule appeared warm on uptake and scan, which can indicate either an inflammatory nodule or a normal functioning one, however, no biopsies are needed for now, since the risk of cancer is very low with this type of nodules. - I plan to repeat the ultrasound one year after establishing euthyroidism  Component     Latest Ref Rng & Units 01/14/2016  TSH     0.35 - 4.50 uIU/mL 0.12 (L)  T4,Free(Direct)     0.60 - 1.60 ng/dL 0.61  Triiodothyronine,Free,Serum     2.3 - 4.2 pg/mL 3.4   Message sent: Dear Ms Hotz, Your free thyroid hormones have improved while the TSH is still low. The TSH usually lags behind the rest of the tests during improvement in Graves' disease. Therefore, please continue your current dose of methimazole view back for labs in 5 weeks. Please call our main office number [phone]) to schedule a lab appointment.  Sincerely, Philemon Kingdom MD  Philemon Kingdom, MD PhD [facility]

## 2016-01-14 NOTE — Patient Instructions (Signed)
Please stop at the lab.  Please continue Methimazole 10 mg 2x a day.  Please come back for a follow-up appointment in 4 months.

## 2016-02-15 ENCOUNTER — Other Ambulatory Visit (INDEPENDENT_AMBULATORY_CARE_PROVIDER_SITE_OTHER): Payer: 59

## 2016-02-15 DIAGNOSIS — E0500 Thyrotoxicosis with diffuse goiter without thyrotoxic crisis or storm: Secondary | ICD-10-CM

## 2016-02-15 LAB — T3, FREE: T3 FREE: 3 pg/mL (ref 2.3–4.2)

## 2016-02-15 LAB — T4, FREE: FREE T4: 0.42 ng/dL — AB (ref 0.60–1.60)

## 2016-02-15 LAB — TSH: TSH: 0.2 u[IU]/mL — ABNORMAL LOW (ref 0.35–4.50)

## 2016-02-16 MED ORDER — METHIMAZOLE 10 MG PO TABS: 5.0000 mg | ORAL_TABLET | Freq: Two times a day (BID) | ORAL | 1 refills | Status: DC

## 2016-03-10 LAB — HM PAP SMEAR: HM Pap smear: NORMAL

## 2016-03-10 LAB — HM MAMMOGRAPHY: HM MAMMO: NORMAL (ref 0–4)

## 2016-03-23 ENCOUNTER — Other Ambulatory Visit (INDEPENDENT_AMBULATORY_CARE_PROVIDER_SITE_OTHER): Payer: 59

## 2016-03-23 DIAGNOSIS — E0500 Thyrotoxicosis with diffuse goiter without thyrotoxic crisis or storm: Secondary | ICD-10-CM | POA: Diagnosis not present

## 2016-03-23 LAB — T3, FREE: T3, Free: 2.9 pg/mL (ref 2.3–4.2)

## 2016-03-23 LAB — TSH: TSH: 1.21 u[IU]/mL (ref 0.35–4.50)

## 2016-03-23 LAB — T4, FREE: Free T4: 0.55 ng/dL — ABNORMAL LOW (ref 0.60–1.60)

## 2016-05-13 ENCOUNTER — Ambulatory Visit (INDEPENDENT_AMBULATORY_CARE_PROVIDER_SITE_OTHER): Payer: 59 | Admitting: Internal Medicine

## 2016-05-13 ENCOUNTER — Encounter: Payer: Self-pay | Admitting: Internal Medicine

## 2016-05-13 VITALS — BP 120/72 | HR 50 | Wt 185.0 lb

## 2016-05-13 DIAGNOSIS — E0590 Thyrotoxicosis, unspecified without thyrotoxic crisis or storm: Secondary | ICD-10-CM | POA: Diagnosis not present

## 2016-05-13 DIAGNOSIS — E0500 Thyrotoxicosis with diffuse goiter without thyrotoxic crisis or storm: Secondary | ICD-10-CM

## 2016-05-13 DIAGNOSIS — E042 Nontoxic multinodular goiter: Secondary | ICD-10-CM | POA: Diagnosis not present

## 2016-05-13 LAB — TSH: TSH: 2.26 u[IU]/mL (ref 0.35–4.50)

## 2016-05-13 LAB — T4, FREE: Free T4: 0.51 ng/dL — ABNORMAL LOW (ref 0.60–1.60)

## 2016-05-13 LAB — T3, FREE: T3, Free: 2.8 pg/mL (ref 2.3–4.2)

## 2016-05-13 MED ORDER — METHIMAZOLE 5 MG PO TABS: 5.0000 mg | ORAL_TABLET | Freq: Every day | ORAL | 1 refills | Status: DC

## 2016-05-13 MED ORDER — METHIMAZOLE 5 MG PO TABS: 5.0000 mg | ORAL_TABLET | Freq: Two times a day (BID) | ORAL | 1 refills | Status: DC

## 2016-05-13 NOTE — Progress Notes (Signed)
Patient ID: Katelyn Price, female   DOB: 1960/02/10, 56 y.o.   MRN: [id number]    HPI  Katelyn Price is a 56 y.o.-year-old female, returning for f/u for Graves ds. and thyroid nodules. Last visit 4 mo ago.  I reviewed pt's thyroid tests: Lab Results  Component Value Date   TSH 1.21 03/23/2016   TSH 0.20 (L) 02/15/2016   TSH 0.12 (L) 01/14/2016   TSH 0.32 (L) 10/14/2015   TSH 0.03 (L) 08/25/2015   TSH 0.02 (L) 08/21/2015   TSH 3.01 08/11/2014   TSH 2.36 07/26/2013   TSH 3.52 07/26/2012   TSH 1.61 06/16/2011   FREET4 0.55 (L) 03/23/2016   FREET4 0.42 (L) 02/15/2016   FREET4 0.61 01/14/2016   FREET4 1.56 10/14/2015   FREET4 1.55 08/25/2015   FREET4 0.85 06/14/2010   She had elevated thyrotropin receptor antibodies and TSIs: Component     Latest Ref Rng & Units 08/25/2015 10/14/2015  Thyrotropin Receptor Ab     <=16.0 % 41.4 (H)   TSI     <140 % baseline  630 (H)   She also had a thyroid ultrasound on 09/04/2015: Right thyroid lobe: 4.1 cm x 1.7 cm x 1.9 cm. Relatively homogeneous appearance of right thyroid tissue.  Right thyroid nodule #1 measures 0.7 cm x 0.7 cm x 0.7 cm mixed cystic and solid composition (1), with hypoechoic  echogenicity (2), taller than wide shape (3) Left thyroid lobe: 5.1 cm x 1.9 cm x 2.4 cm. Relatively homogeneous appearance of the left thyroid.  *Inferior left nodule #1 measures 1.5 cm x 1.1 cm x 1.3 cm Nearly completely solid composition (2), with isoechoic echogenicity (1).   There are 3 additional separate nodules all measuring less than 5 mm with characteristics compatible with colloid cysts. Isthmus Thickness: 0.4 cm.  No nodules visualized. Lymphadenopathy: None visualized.  We obtained a Thyroid Uptake and scan (10/22/2015) >> Graves ds.: 24 hour radioactive iodine uptake 36.9%.Diffuse radiotracer uptake identified within both lobes of the thyroid gland. No dominant hot or cold nodule.   She was initially started on Methimazole 10 mg  2x a day >> decreased to 5 mg 2x a day 02/2016 >> TFTs normalized.  She is also on Propranolol (Inderal 80 mg at bedtime) for tremors. She is on Lithium for bipolar disorder.  Pt denies: - feeling nodules in neck - hoarseness - dysphagia - choking - SOB with lying down  Pt does have a FH of thyroid ds on father's side of the family. No FH of thyroid cancer. No h/o radiation tx to head or neck.  No seaweed or kelp, no recent contrast studies. No steroid use. No herbal supplements. No Biotin use.  Pt. also has a history of GERD, psoriasis, bipolar dis., HTN, B12 def.  ROS: Constitutional: + weight gain/no weight loss, no fatigue, no subjective hyperthermia, no subjective hypothermia Eyes: no blurry vision, no xerophthalmia ENT: + sore throat, + see HPI Cardiovascular: + occas. CP/no SOB/no palpitations/no leg swelling Respiratory: no cough/no SOB/no wheezing Gastrointestinal: + N/no V/no D/no C/+ acid reflux Musculoskeletal: no muscle aches/no joint aches Skin: no rashes, no hair loss Neurological: + slightly improved tremors (on Li)/numbness/tingling/dizziness, + HA (chronic)  I reviewed pt's medications, allergies, PMH, social hx, family hx, and changes were documented in the history of present illness. Otherwise, unchanged from my initial visit note.  Past Medical History:  Diagnosis Date  . Abnormal involuntary movements(781.0)   . Acne   . Allergic  rhinitis   . Bipolar disorder, unspecified ([facility])    psych - Dr. Lattie Haw  . Diaphragmatic hernia without mention of obstruction or gangrene   . GERD (gastroesophageal reflux disease)    03/18/02, EGD, [facility] Gerd,gastritis, dilated  . History of MRI of brain and brain stem 06/01/03   [facility]  . Hypertension   . Other psoriasis   . Unspecified sinusitis (chronic)    Past Surgical History:  Procedure Laterality Date  . COLONOSCOPY  03/18/02   internal hemorrhoids  . COLONOSCOPY  06/2014   diverticulosis, rpt 10 yrs Collene Mares)  .  ESOPHAGOGASTRODUODENOSCOPY  1/99   [facility]arlett Iles; HH,GERD  . ESOPHAGOGASTRODUODENOSCOPY  03/18/02   HH,GERD,Gastritis; Dilated  . LAPAROSCOPIC ESOPHAGOGASTRIC FUNDOPLASTY  08/28/02   Hassell Done   Social History   Social History  . Marital status: Married    Spouse name: N/A  . Number of children: 0   Occupational History  . Housewife    Social History Main Topics  . Smoking status: Former Smoker    Packs/day: 1.00    Years: 20.00    Types: Cigarettes    Quit date: 01/10/1998  . Smokeless tobacco: Never Used  . Alcohol use No  . Drug use: No   Social History Narrative   Caffeine: drinks diet drinks   Lives with husband and 1 dog, 1 cat   Occupation: housewife   Activity: no regular exercise, doesn't feel safe to walk outside at home, could walk on mother's treadmill   Diet: good water, fruits/vegetables some    Current Outpatient Prescriptions on File Prior to Visit  Medication Sig Dispense Refill  . buPROPion (WELLBUTRIN XL) 150 MG 24 hr tablet Take 150 mg by mouth daily.     . busPIRone (BUSPAR) 15 MG tablet Take 7.5 mg by mouth 3 (three) times daily.     . Ca & Phos-Vit D-Mag (CALCIUM) [phone] TABS Take by mouth.    . Cholecalciferol (VITAMIN D) 2000 UNITS CAPS Take 1 capsule by mouth daily.     . citalopram (CELEXA) 10 MG tablet Take 20 mg by mouth daily.     . clindamycin (CLEOCIN T) 1 % lotion Apply 1 application topically daily.    Marland Kitchen estradiol (ESTRACE) 2 MG tablet Take 2 mg by mouth daily.    Marland Kitchen guanFACINE (TENEX) 1 MG tablet Take 1 mg by mouth 2 (two) times daily.    Marland Kitchen lisinopril (PRINIVIL,ZESTRIL) 10 MG tablet TAKE 1 TABLET (10 MG TOTAL) BY MOUTH DAILY. 90 tablet 3  . lithium carbonate (LITHOBID) 300 MG CR tablet Take 300 mg by mouth 2 (two) times daily.    . methimazole (TAPAZOLE) 10 MG tablet Take 0.5 tablets (5 mg total) by mouth 2 (two) times daily. 180 tablet 1  . pantoprazole (PROTONIX) 40 MG tablet Take 1 tablet (40 mg total) by mouth daily. 90 tablet 3  .  polyethylene glycol (MIRALAX / GLYCOLAX) packet Take 17 g by mouth daily.    . progesterone (PROMETRIUM) 100 MG capsule Take 100 mg by mouth at bedtime.  12  . propranolol (INDERAL) 80 MG tablet Take 80 mg by mouth at bedtime.    . psyllium (METAMUCIL) 58.6 % packet Take 1 packet by mouth daily.    Marland Kitchen rOPINIRole (REQUIP) 1 MG tablet Take 1 mg by mouth 3 (three) times daily.    . vitamin B-12 (CYANOCOBALAMIN) 500 MCG tablet Take 2,000 mcg by mouth daily.     . ziprasidone (GEODON) 80 MG capsule Take 240 mg by  mouth at bedtime.      No current facility-administered medications on file prior to visit.    Allergies  Allergen Reactions  . Hydrocodone-Homatropine     REACTION: unspecified  . Minocycline Hcl     REACTION: Itching   Family History  Problem Relation Age of Onset  . Arthritis Mother     fibromyalgia  . Alcohol abuse Father   . Emphysema Brother     smoker  . COPD Brother     smoker, continues.  . Arthritis Sister     fibromyalgia  . Cancer Neg Hx   . Coronary artery disease Neg Hx   . Stroke Neg Hx   . Diabetes Neg Hx    PE: BP 120/72 (BP Location: Left Arm, Patient Position: Sitting)   Pulse (!) 50   Wt 185 lb (83.9 kg)   SpO2 98%   BMI 32.77 kg/m  Wt Readings from Last 3 Encounters:  05/13/16 185 lb (83.9 kg)  01/14/16 179 lb (81.2 kg)  10/14/15 169 lb (76.7 kg)   Constitutional: overweight, in NAD Eyes: PERRLA, EOMI, no exophthalmos ENT: moist mucous membranes, no thyromegaly, no cervical lymphadenopathy Cardiovascular: RRR, No MRG Respiratory: CTA B Gastrointestinal: abdomen soft, NT, ND, BS+ Musculoskeletal: no deformities, strength intact in all 4 Skin: moist, warm, no rashes Neurological: +++ tremor with outstretched hands, DTR normal  ASSESSMENT: 1. Thyrotoxicosis  2. Thyroid nodules  PLAN:  1. Patient with thyrotoxicosis dx'ed summer 2017, With thyrotoxic symptoms: Tremors (she had lithium-induced tremors, but these exacerbated secondary to  hyperthyroidism), heat intolerance, hypertensive medication, palpitations, shortness of breath. Symptoms improved after starting methimazole and we were able to decrease the dose to 5 mg twice a day 3 months ago. TFTs returned normal after this change. - We again discussed about different modalities of treatment for Graves' disease to include methimazole use, radioactive iodine ablation or last resort, surgery. If she can tolerate the methimazole well and this is effective, we can decrease this to the lowest possible dose and she can remain on this for as long as necessary per the latest studies. She agrees with this. - Today we will check a TSH, free T4 and free T3 and will adjust methimazole dose accordingly - she is on beta blocker for tremors >> continue - RTC in 6 months, but sooner for labs  2. Thyroid nodules - Patient has a 1.5 cm iso-slightly hypoechoic nodule , with internal blood flow. The nodule appeared warm on uptake and scan, which can either indicate an inflammatory nodule or a normal functioning one -  no biopsies are necessary for now, but we will need to keep an eye on this and I plan to repeat another ultrasound after she becomes euthyroid and maintains this state.   Component     Latest Ref Rng & Units 05/13/2016  TSH     0.35 - 4.50 uIU/mL 2.26  T4,Free(Direct)     0.60 - 1.60 ng/dL 0.51 (L)  Triiodothyronine,Free,Serum     2.3 - 4.2 pg/mL 2.8   We can reduce MMI further, to 5 mg daily and repeat the labs in 1 month.  Philemon Kingdom, MD PhD [facility]

## 2016-05-13 NOTE — Patient Instructions (Signed)
Please continue Methimazole 5 mg 2x a day.  Please stop at the lab.  Please come back for a follow-up appointment in 6 months.

## 2016-05-30 DIAGNOSIS — Z01419 Encounter for gynecological examination (general) (routine) without abnormal findings: Secondary | ICD-10-CM | POA: Diagnosis not present

## 2016-06-10 ENCOUNTER — Other Ambulatory Visit: Payer: 59

## 2016-06-15 ENCOUNTER — Other Ambulatory Visit (INDEPENDENT_AMBULATORY_CARE_PROVIDER_SITE_OTHER): Payer: 59

## 2016-06-15 DIAGNOSIS — E0500 Thyrotoxicosis with diffuse goiter without thyrotoxic crisis or storm: Secondary | ICD-10-CM

## 2016-06-15 LAB — T3, FREE: T3, Free: 2.6 pg/mL (ref 2.3–4.2)

## 2016-06-15 LAB — T4, FREE: Free T4: 0.65 ng/dL (ref 0.60–1.60)

## 2016-06-15 LAB — TSH: TSH: 3.07 u[IU]/mL (ref 0.35–4.50)

## 2016-06-15 MED ORDER — METHIMAZOLE 5 MG PO TABS: 5.0000 mg | ORAL_TABLET | ORAL | 1 refills | Status: DC

## 2016-06-21 DIAGNOSIS — N950 Postmenopausal bleeding: Secondary | ICD-10-CM | POA: Diagnosis not present

## 2016-07-25 ENCOUNTER — Other Ambulatory Visit (INDEPENDENT_AMBULATORY_CARE_PROVIDER_SITE_OTHER): Payer: 59

## 2016-07-25 DIAGNOSIS — E0500 Thyrotoxicosis with diffuse goiter without thyrotoxic crisis or storm: Secondary | ICD-10-CM

## 2016-07-25 LAB — T4, FREE: FREE T4: 1.03 ng/dL (ref 0.60–1.60)

## 2016-07-25 LAB — T3, FREE: T3 FREE: 3.8 pg/mL (ref 2.3–4.2)

## 2016-07-25 LAB — TSH: TSH: 0.02 u[IU]/mL — AB (ref 0.35–4.50)

## 2016-07-25 MED ORDER — METHIMAZOLE 5 MG PO TABS: ORAL_TABLET | ORAL | 1 refills | Status: DC

## 2016-08-20 ENCOUNTER — Other Ambulatory Visit: Payer: Self-pay | Admitting: Family Medicine

## 2016-08-20 DIAGNOSIS — E538 Deficiency of other specified B group vitamins: Secondary | ICD-10-CM

## 2016-08-20 DIAGNOSIS — F3170 Bipolar disorder, currently in remission, most recent episode unspecified: Secondary | ICD-10-CM

## 2016-08-20 DIAGNOSIS — R7301 Impaired fasting glucose: Secondary | ICD-10-CM

## 2016-08-22 ENCOUNTER — Other Ambulatory Visit (INDEPENDENT_AMBULATORY_CARE_PROVIDER_SITE_OTHER): Payer: 59

## 2016-08-22 DIAGNOSIS — R7301 Impaired fasting glucose: Secondary | ICD-10-CM | POA: Diagnosis not present

## 2016-08-22 DIAGNOSIS — E538 Deficiency of other specified B group vitamins: Secondary | ICD-10-CM

## 2016-08-22 DIAGNOSIS — F3170 Bipolar disorder, currently in remission, most recent episode unspecified: Secondary | ICD-10-CM

## 2016-08-22 LAB — CBC WITH DIFFERENTIAL/PLATELET
BASOS ABS: 0.1 10*3/uL (ref 0.0–0.1)
Basophils Relative: 0.7 % (ref 0.0–3.0)
EOS PCT: 2.4 % (ref 0.0–5.0)
Eosinophils Absolute: 0.2 10*3/uL (ref 0.0–0.7)
HCT: 40.3 % (ref 36.0–46.0)
Hemoglobin: 12.9 g/dL (ref 12.0–15.0)
LYMPHS PCT: 20.7 % (ref 12.0–46.0)
Lymphs Abs: 2.1 10*3/uL (ref 0.7–4.0)
MCHC: 31.9 g/dL (ref 30.0–36.0)
MCV: 98 fl (ref 78.0–100.0)
MONOS PCT: 7.6 % (ref 3.0–12.0)
Monocytes Absolute: 0.8 10*3/uL (ref 0.1–1.0)
NEUTROS ABS: 6.9 10*3/uL (ref 1.4–7.7)
Neutrophils Relative %: 68.6 % (ref 43.0–77.0)
Platelets: 287 10*3/uL (ref 150.0–400.0)
RBC: 4.12 Mil/uL (ref 3.87–5.11)
RDW: 13 % (ref 11.5–15.5)
WBC: 10 10*3/uL (ref 4.0–10.5)

## 2016-08-22 LAB — COMPREHENSIVE METABOLIC PANEL
ALK PHOS: 45 U/L (ref 39–117)
ALT: 15 U/L (ref 0–35)
AST: 19 U/L (ref 0–37)
Albumin: 3.7 g/dL (ref 3.5–5.2)
BILIRUBIN TOTAL: 0.4 mg/dL (ref 0.2–1.2)
BUN: 14 mg/dL (ref 6–23)
CALCIUM: 9.9 mg/dL (ref 8.4–10.5)
CO2: 26 meq/L (ref 19–32)
Chloride: 103 mEq/L (ref 96–112)
Creatinine, Ser: 0.89 mg/dL (ref 0.40–1.20)
GFR: 69.85 mL/min (ref >60.00)
Glucose, Bld: 108 mg/dL — ABNORMAL HIGH (ref 70–99)
POTASSIUM: 4.9 meq/L (ref 3.5–5.1)
Sodium: 135 mEq/L (ref 135–145)
Total Protein: 6.1 g/dL (ref 6.0–8.3)

## 2016-08-22 LAB — VITAMIN B12: Vitamin B-12: 968 pg/mL — ABNORMAL HIGH (ref 211–911)

## 2016-08-23 LAB — LITHIUM LEVEL: Lithium Lvl: 0.7 mmol/L (ref 0.6–1.2)

## 2016-08-25 ENCOUNTER — Ambulatory Visit (INDEPENDENT_AMBULATORY_CARE_PROVIDER_SITE_OTHER): Payer: 59 | Admitting: Family Medicine

## 2016-08-25 ENCOUNTER — Encounter: Payer: Self-pay | Admitting: Family Medicine

## 2016-08-25 VITALS — BP 124/76 | HR 50 | Temp 98.2°F | Ht 61.75 in | Wt 179.5 lb

## 2016-08-25 DIAGNOSIS — E042 Nontoxic multinodular goiter: Secondary | ICD-10-CM

## 2016-08-25 DIAGNOSIS — G251 Drug-induced tremor: Secondary | ICD-10-CM | POA: Diagnosis not present

## 2016-08-25 DIAGNOSIS — I10 Essential (primary) hypertension: Secondary | ICD-10-CM

## 2016-08-25 DIAGNOSIS — F3170 Bipolar disorder, currently in remission, most recent episode unspecified: Secondary | ICD-10-CM

## 2016-08-25 DIAGNOSIS — R7301 Impaired fasting glucose: Secondary | ICD-10-CM

## 2016-08-25 DIAGNOSIS — Z0000 Encounter for general adult medical examination without abnormal findings: Secondary | ICD-10-CM | POA: Diagnosis not present

## 2016-08-25 DIAGNOSIS — K219 Gastro-esophageal reflux disease without esophagitis: Secondary | ICD-10-CM | POA: Diagnosis not present

## 2016-08-25 DIAGNOSIS — E538 Deficiency of other specified B group vitamins: Secondary | ICD-10-CM

## 2016-08-25 DIAGNOSIS — E0590 Thyrotoxicosis, unspecified without thyrotoxic crisis or storm: Secondary | ICD-10-CM | POA: Diagnosis not present

## 2016-08-25 MED ORDER — DEXLANSOPRAZOLE 60 MG PO CPDR
60.0000 mg | DELAYED_RELEASE_CAPSULE | Freq: Every day | ORAL | 1 refills | Status: DC
Start: 2016-08-25 — End: 2017-01-06

## 2016-08-25 NOTE — Assessment & Plan Note (Signed)
Preventative protocols reviewed and updated unless pt declined. Discussed healthy diet and lifestyle.  

## 2016-08-25 NOTE — Assessment & Plan Note (Addendum)
Maintaining levels with OTC dosing.

## 2016-08-25 NOTE — Assessment & Plan Note (Signed)
Breakthrough sxs despite endorsed daily protonix. No other red flags. Will increase PPI potency to dexilant 60mg  daily.

## 2016-08-25 NOTE — Progress Notes (Signed)
BP 124/76   Pulse (!) 50   Temp 98.2 F (36.8 C) (Oral)   Ht 5' 1.75" (1.568 m)   Wt 179 lb 8 oz (81.4 kg)   SpO2 99%   BMI 33.10 kg/m    CC: CPE Subjective:    Patient ID: Katelyn Price, female    DOB: 02/10/1960, 56 y.o.   MRN: [id number]  HPI: Katelyn Price is a 56 y.o. female presenting on 08/25/2016 for Annual Exam   Recent dx graves disease followed by endo Dr Cruzita Lederer. Initially with thyrotoxicosis. Now on methimazole 5/2.5mg  alternating daily.  Ongoing tremors despite propranolol 80mg  daily (Li related)  Psych - followed by Theodoro Grist.   GERD - takes protonix 40mg  daily but also adds on OTC antacid (unsure name) due to breakthrough symptoms. No dysphagia or nausea/vomiting.   Husband helps manage medications.  She takes metamucil and miralax daily to stay regular.   Preventative: Well woman with OBGYN 03/2016 (Dr. Nori Riis), normal mammogram and pap smear per patient this year. Was on HRT - she stopped this this year.  LMP 07/2016 COLONOSCOPY 06/2014 diverticulosis, rpt 10 yrs Collene Mares) Flu shot yearly Tdap - 06/2011  shingrix - discussed.  Seat belt use discussed  Sunscreen use discussed, no changing moles Ex smoker - quit 2007. Husband smokes pipe and e-cig. Alcohol - none   Caffeine: drinks diet drinks  Lives with husband and 1 dog, 1 cat  Occupation: housewife  Activity: using treadmill  Diet: good water, fruits/vegetables some   Relevant past medical, surgical, family and social history reviewed and updated as indicated. Interim medical history since our last visit reviewed. Allergies and medications reviewed and updated. Outpatient Medications Prior to Visit  Medication Sig Dispense Refill  . buPROPion (WELLBUTRIN XL) 150 MG 24 hr tablet Take 150 mg by mouth daily.     . busPIRone (BUSPAR) 15 MG tablet Take 7.5 mg by mouth 3 (three) times daily.     . Ca & Phos-Vit D-Mag (CALCIUM) [phone] TABS Take by mouth.    . Cholecalciferol (VITAMIN D)  2000 UNITS CAPS Take 1 capsule by mouth daily.     . citalopram (CELEXA) 10 MG tablet Take 20 mg by mouth daily.     . clindamycin (CLEOCIN T) 1 % lotion Apply 1 application topically daily.    Marland Kitchen estradiol (ESTRACE) 2 MG tablet Take 2 mg by mouth daily.    Marland Kitchen guanFACINE (TENEX) 1 MG tablet Take 1 mg by mouth 2 (two) times daily.    Marland Kitchen lisinopril (PRINIVIL,ZESTRIL) 10 MG tablet TAKE 1 TABLET (10 MG TOTAL) BY MOUTH DAILY. 90 tablet 3  . lithium carbonate (LITHOBID) 300 MG CR tablet Take 300 mg by mouth 2 (two) times daily.    . methimazole (TAPAZOLE) 5 MG tablet Take 5 mg alternating with 2.5 mg every other day 45 tablet 1  . polyethylene glycol (MIRALAX / GLYCOLAX) packet Take 17 g by mouth daily.    . progesterone (PROMETRIUM) 100 MG capsule Take 100 mg by mouth at bedtime.  12  . propranolol (INDERAL) 80 MG tablet Take 80 mg by mouth at bedtime.    . psyllium (METAMUCIL) 58.6 % packet Take 1 packet by mouth daily.    Marland Kitchen rOPINIRole (REQUIP) 1 MG tablet Take 1 mg by mouth 3 (three) times daily.    . vitamin B-12 (CYANOCOBALAMIN) 500 MCG tablet Take 2,000 mcg by mouth daily.     . ziprasidone (GEODON) 80 MG capsule Take  240 mg by mouth at bedtime.     . pantoprazole (PROTONIX) 40 MG tablet Take 1 tablet (40 mg total) by mouth daily. 90 tablet 3   No facility-administered medications prior to visit.      Per HPI unless specifically indicated in ROS section below Review of Systems  Constitutional: Negative for activity change, appetite change, chills, fatigue, fever and unexpected weight change.  HENT: Negative for hearing loss.   Eyes: Negative for visual disturbance.  Respiratory: Negative for cough, chest tightness, shortness of breath and wheezing.   Cardiovascular: Positive for chest pain (occasional ?anxiety related). Negative for palpitations and leg swelling.  Gastrointestinal: Negative for abdominal distention, abdominal pain, blood in stool, constipation, diarrhea, nausea and vomiting.    Genitourinary: Negative for difficulty urinating and hematuria.  Musculoskeletal: Negative for arthralgias, myalgias and neck pain.  Skin: Negative for rash.  Neurological: Positive for headaches. Negative for dizziness, seizures and syncope.  Hematological: Negative for adenopathy. Bruises/bleeds easily.  Psychiatric/Behavioral: Negative for dysphoric mood. The patient is not nervous/anxious.        Objective:    BP 124/76   Pulse (!) 50   Temp 98.2 F (36.8 C) (Oral)   Ht 5' 1.75" (1.568 m)   Wt 179 lb 8 oz (81.4 kg)   SpO2 99%   BMI 33.10 kg/m   Wt Readings from Last 3 Encounters:  08/25/16 179 lb 8 oz (81.4 kg)  05/13/16 185 lb (83.9 kg)  01/14/16 179 lb (81.2 kg)    Physical Exam  Constitutional: She is oriented to person, place, and time. She appears well-developed and well-nourished. No distress.  HENT:  Head: Normocephalic and atraumatic.  Right Ear: Hearing, tympanic membrane, external ear and ear canal normal.  Left Ear: Hearing, tympanic membrane, external ear and ear canal normal.  Nose: Nose normal.  Mouth/Throat: Uvula is midline, oropharynx is clear and moist and mucous membranes are normal. No oropharyngeal exudate, posterior oropharyngeal edema or posterior oropharyngeal erythema.  Eyes: Pupils are equal, round, and reactive to light. Conjunctivae and EOM are normal. No scleral icterus.  Neck: Normal range of motion. Neck supple. No thyromegaly present.  Cardiovascular: Normal rate, regular rhythm, normal heart sounds and intact distal pulses.   No murmur heard. Pulses:      Radial pulses are 2+ on the right side, and 2+ on the left side.  Pulmonary/Chest: Effort normal and breath sounds normal. No respiratory distress. She has no wheezes. She has no rales.  Abdominal: Soft. Bowel sounds are normal. She exhibits no distension and no mass. There is no tenderness. There is no rebound and no guarding.  Musculoskeletal: Normal range of motion. She exhibits no  edema.  Lymphadenopathy:    She has no cervical adenopathy.  Neurological: She is alert and oriented to person, place, and time.  CN grossly intact, station and gait intact  Skin: Skin is warm and dry. No rash noted.  Psychiatric: She has a normal mood and affect. Her behavior is normal. Judgment and thought content normal.  Nursing note and vitals reviewed.  Results for orders placed or performed in visit on 08/25/16  HM MAMMOGRAPHY  Result Value Ref Range   HM Mammogram Self Reported Normal 0-4 Bi-Rad, Self Reported Normal  HM PAP SMEAR  Result Value Ref Range   HM Pap smear per pt normal       Assessment & Plan:   Problem List Items Addressed This Visit    B12 deficiency    Maintaining levels  with OTC dosing.       Bipolar disorder [facility])    Appreciate psych care - she continues to see Dr Noemi Chapel      GERD (gastroesophageal reflux disease)    Breakthrough sxs despite endorsed daily protonix. No other red flags. Will increase PPI potency to dexilant 60mg  daily.       Relevant Medications   dexlansoprazole (DEXILANT) 60 MG capsule   Healthcare maintenance - Primary    Preventative protocols reviewed and updated unless pt declined. Discussed healthy diet and lifestyle.       HYPERTENSION, BENIGN ESSENTIAL    Chronic, stable. Continue current regimen.       Hyperthyroidism    Appreciate endo care.       Impaired fasting glucose    Will continue to monitor.       Multiple thyroid nodules (Chronic)    Followed by endo      Tremor due to multiple drugs    Ongoing. Continue propranolol 80mg  nightly .          Follow up plan: Return in about 1 year (around 08/25/2017) for annual exam, prior fasting for blood work.  Ria Bush, MD

## 2016-08-25 NOTE — Assessment & Plan Note (Addendum)
Appreciate psych care - she continues to see Dr Noemi Chapel

## 2016-08-25 NOTE — Assessment & Plan Note (Signed)
Chronic, stable. Continue current regimen. 

## 2016-08-25 NOTE — Assessment & Plan Note (Signed)
Ongoing. Continue propranolol 80mg  nightly .

## 2016-08-25 NOTE — Assessment & Plan Note (Signed)
Followed by endo.  

## 2016-08-25 NOTE — Assessment & Plan Note (Signed)
Appreciate endo care.  

## 2016-08-25 NOTE — Assessment & Plan Note (Signed)
Will continue to monitor.

## 2016-08-25 NOTE — Patient Instructions (Addendum)
Double check with OBGYN about hormone replacement therapy and cycles.  If interested, check with pharmacy about new 2 shot shingles series (shingrix).  For acid reflux - change protonix to dexilant stronger heartburn medicine - sent to pharmacy.  Let me know name of over the counter medicine you're using for heartburn as well.  You are doing well today Return as needed or in 1 year for next physical.  Health Maintenance, Female Adopting a healthy lifestyle and getting preventive care can go a long way to promote health and wellness. Talk with your health care provider about what schedule of regular examinations is right for you. This is a good chance for you to check in with your provider about disease prevention and staying healthy. In between checkups, there are plenty of things you can do on your own. Experts have done a lot of research about which lifestyle changes and preventive measures are most likely to keep you healthy. Ask your health care provider for more information. Weight and diet Eat a healthy diet  Be sure to include plenty of vegetables, fruits, low-fat dairy products, and lean protein.  Do not eat a lot of foods high in solid fats, added sugars, or salt.  Get regular exercise. This is one of the most important things you can do for your health. ? Most adults should exercise for at least 150 minutes each week. The exercise should increase your heart rate and make you sweat (moderate-intensity exercise). ? Most adults should also do strengthening exercises at least twice a week. This is in addition to the moderate-intensity exercise.  Maintain a healthy weight  Body mass index (BMI) is a measurement that can be used to identify possible weight problems. It estimates body fat based on height and weight. Your health care provider can help determine your BMI and help you achieve or maintain a healthy weight.  For females 80 years of age and older: ? A BMI below 18.5 is  considered underweight. ? A BMI of 18.5 to 24.9 is normal. ? A BMI of 25 to 29.9 is considered overweight. ? A BMI of 30 and above is considered obese.  Watch levels of cholesterol and blood lipids  You should start having your blood tested for lipids and cholesterol at 55 years of age, then have this test every 5 years.  You may need to have your cholesterol levels checked more often if: ? Your lipid or cholesterol levels are high. ? You are older than 55 years of age. ? You are at high risk for heart disease.  Cancer screening Lung Cancer  Lung cancer screening is recommended for adults 57-72 years old who are at high risk for lung cancer because of a history of smoking.  A yearly low-dose CT scan of the lungs is recommended for people who: ? Currently smoke. ? Have quit within the past 15 years. ? Have at least a 30-pack-year history of smoking. A pack year is smoking an average of one pack of cigarettes a day for 1 year.  Yearly screening should continue until it has been 15 years since you quit.  Yearly screening should stop if you develop a health problem that would prevent you from having lung cancer treatment.  Breast Cancer  Practice breast self-awareness. This means understanding how your breasts normally appear and feel.  It also means doing regular breast self-exams. Let your health care provider know about any changes, no matter how small.  If you are in your 71s  or 3s, you should have a clinical breast exam (CBE) by a health care provider every 1-3 years as part of a regular health exam.  If you are 87 or older, have a CBE every year. Also consider having a breast X-ray (mammogram) every year.  If you have a family history of breast cancer, talk to your health care provider about genetic screening.  If you are at high risk for breast cancer, talk to your health care provider about having an MRI and a mammogram every year.  Breast cancer gene (BRCA) assessment  is recommended for women who have family members with BRCA-related cancers. BRCA-related cancers include: ? Breast. ? Ovarian. ? Tubal. ? Peritoneal cancers.  Results of the assessment will determine the need for genetic counseling and BRCA1 and BRCA2 testing.  Cervical Cancer Your health care provider may recommend that you be screened regularly for cancer of the pelvic organs (ovaries, uterus, and vagina). This screening involves a pelvic examination, including checking for microscopic changes to the surface of your cervix (Pap test). You may be encouraged to have this screening done every 3 years, beginning at age 48.  For women ages 17-65, health care providers may recommend pelvic exams and Pap testing every 3 years, or they may recommend the Pap and pelvic exam, combined with testing for human papilloma virus (HPV), every 5 years. Some types of HPV increase your risk of cervical cancer. Testing for HPV may also be done on women of any age with unclear Pap test results.  Other health care providers may not recommend any screening for nonpregnant women who are considered low risk for pelvic cancer and who do not have symptoms. Ask your health care provider if a screening pelvic exam is right for you.  If you have had past treatment for cervical cancer or a condition that could lead to cancer, you need Pap tests and screening for cancer for at least 20 years after your treatment. If Pap tests have been discontinued, your risk factors (such as having a new sexual partner) need to be reassessed to determine if screening should resume. Some women have medical problems that increase the chance of getting cervical cancer. In these cases, your health care provider may recommend more frequent screening and Pap tests.  Colorectal Cancer  This type of cancer can be detected and often prevented.  Routine colorectal cancer screening usually begins at 55 years of age and continues through 55 years of  age.  Your health care provider may recommend screening at an earlier age if you have risk factors for colon cancer.  Your health care provider may also recommend using home test kits to check for hidden blood in the stool.  A small camera at the end of a tube can be used to examine your colon directly (sigmoidoscopy or colonoscopy). This is done to check for the earliest forms of colorectal cancer.  Routine screening usually begins at age 45.  Direct examination of the colon should be repeated every 5-10 years through 55 years of age. However, you may need to be screened more often if early forms of precancerous polyps or small growths are found.  Skin Cancer  Check your skin from head to toe regularly.  Tell your health care provider about any new moles or changes in moles, especially if there is a change in a mole's shape or color.  Also tell your health care provider if you have a mole that is larger than the size of a  pencil eraser.  Always use sunscreen. Apply sunscreen liberally and repeatedly throughout the day.  Protect yourself by wearing long sleeves, pants, a wide-brimmed hat, and sunglasses whenever you are outside.  Heart disease, diabetes, and high blood pressure  High blood pressure causes heart disease and increases the risk of stroke. High blood pressure is more likely to develop in: ? People who have blood pressure in the high end of the normal range (130-139/85-89 mm Hg). ? People who are overweight or obese. ? People who are African American.  If you are 47-60 years of age, have your blood pressure checked every 3-5 years. If you are 68 years of age or older, have your blood pressure checked every year. You should have your blood pressure measured twice-once when you are at a hospital or clinic, and once when you are not at a hospital or clinic. Record the average of the two measurements. To check your blood pressure when you are not at a hospital or clinic, you  can use: ? An automated blood pressure machine at a pharmacy. ? A home blood pressure monitor.  If you are between 60 years and 32 years old, ask your health care provider if you should take aspirin to prevent strokes.  Have regular diabetes screenings. This involves taking a blood sample to check your fasting blood sugar level. ? If you are at a normal weight and have a low risk for diabetes, have this test once every three years after 55 years of age. ? If you are overweight and have a high risk for diabetes, consider being tested at a younger age or more often. Preventing infection Hepatitis B  If you have a higher risk for hepatitis B, you should be screened for this virus. You are considered at high risk for hepatitis B if: ? You were born in a country where hepatitis B is common. Ask your health care provider which countries are considered high risk. ? Your parents were born in a high-risk country, and you have not been immunized against hepatitis B (hepatitis B vaccine). ? You have HIV or AIDS. ? You use needles to inject street drugs. ? You live with someone who has hepatitis B. ? You have had sex with someone who has hepatitis B. ? You get hemodialysis treatment. ? You take certain medicines for conditions, including cancer, organ transplantation, and autoimmune conditions.  Hepatitis C  Blood testing is recommended for: ? Everyone born from [birth year] through 1964. ? Anyone with known risk factors for hepatitis C.  Sexually transmitted infections (STIs)  You should be screened for sexually transmitted infections (STIs) including gonorrhea and chlamydia if: ? You are sexually active and are younger than 55 years of age. ? You are older than 55 years of age and your health care provider tells you that you are at risk for this type of infection. ? Your sexual activity has changed since you were last screened and you are at an increased risk for chlamydia or gonorrhea. Ask your  health care provider if you are at risk.  If you do not have HIV, but are at risk, it may be recommended that you take a prescription medicine daily to prevent HIV infection. This is called pre-exposure prophylaxis (PrEP). You are considered at risk if: ? You are sexually active and do not regularly use condoms or know the HIV status of your partner(s). ? You take drugs by injection. ? You are sexually active with a partner who has HIV.  Talk with your health care provider about whether you are at high risk of being infected with HIV. If you choose to begin PrEP, you should first be tested for HIV. You should then be tested every 3 months for as long as you are taking PrEP. Pregnancy  If you are premenopausal and you may become pregnant, ask your health care provider about preconception counseling.  If you may become pregnant, take 400 to 800 micrograms (mcg) of folic acid every day.  If you want to prevent pregnancy, talk to your health care provider about birth control (contraception). Osteoporosis and menopause  Osteoporosis is a disease in which the bones lose minerals and strength with aging. This can result in serious bone fractures. Your risk for osteoporosis can be identified using a bone density scan.  If you are 36 years of age or older, or if you are at risk for osteoporosis and fractures, ask your health care provider if you should be screened.  Ask your health care provider whether you should take a calcium or vitamin D supplement to lower your risk for osteoporosis.  Menopause may have certain physical symptoms and risks.  Hormone replacement therapy may reduce some of these symptoms and risks. Talk to your health care provider about whether hormone replacement therapy is right for you. Follow these instructions at home:  Schedule regular health, dental, and eye exams.  Stay current with your immunizations.  Do not use any tobacco products including cigarettes, chewing  tobacco, or electronic cigarettes.  If you are pregnant, do not drink alcohol.  If you are breastfeeding, limit how much and how often you drink alcohol.  Limit alcohol intake to no more than 1 drink per day for nonpregnant women. One drink equals 12 ounces of beer, 5 ounces of wine, or 1 ounces of hard liquor.  Do not use street drugs.  Do not share needles.  Ask your health care provider for help if you need support or information about quitting drugs.  Tell your health care provider if you often feel depressed.  Tell your health care provider if you have ever been abused or do not feel safe at home. This information is not intended to replace advice given to you by your health care provider. Make sure you discuss any questions you have with your health care provider. Document Released: 07/12/2010 Document Revised: 06/04/2015 Document Reviewed: 09/30/2014 Elsevier Interactive Patient Education  [employer].

## 2016-09-05 ENCOUNTER — Other Ambulatory Visit (INDEPENDENT_AMBULATORY_CARE_PROVIDER_SITE_OTHER): Payer: 59

## 2016-09-05 DIAGNOSIS — E0500 Thyrotoxicosis with diffuse goiter without thyrotoxic crisis or storm: Secondary | ICD-10-CM | POA: Diagnosis not present

## 2016-09-05 LAB — TSH: TSH: 0.01 u[IU]/mL — AB (ref 0.35–4.50)

## 2016-09-05 LAB — T4, FREE: Free T4: 0.9 ng/dL (ref 0.60–1.60)

## 2016-09-05 LAB — T3, FREE: T3 FREE: 3.6 pg/mL (ref 2.3–4.2)

## 2016-09-06 MED ORDER — METHIMAZOLE 5 MG PO TABS: ORAL_TABLET | ORAL | 1 refills | Status: DC

## 2016-09-28 ENCOUNTER — Other Ambulatory Visit: Payer: Self-pay | Admitting: Family Medicine

## 2016-10-31 DIAGNOSIS — L821 Other seborrheic keratosis: Secondary | ICD-10-CM | POA: Diagnosis not present

## 2016-10-31 DIAGNOSIS — L700 Acne vulgaris: Secondary | ICD-10-CM | POA: Diagnosis not present

## 2016-10-31 DIAGNOSIS — D485 Neoplasm of uncertain behavior of skin: Secondary | ICD-10-CM | POA: Diagnosis not present

## 2016-11-14 ENCOUNTER — Encounter: Payer: Self-pay | Admitting: Internal Medicine

## 2016-11-14 ENCOUNTER — Ambulatory Visit (INDEPENDENT_AMBULATORY_CARE_PROVIDER_SITE_OTHER): Payer: 59 | Admitting: Internal Medicine

## 2016-11-14 VITALS — BP 124/80 | HR 60 | Ht 61.5 in | Wt 185.0 lb

## 2016-11-14 DIAGNOSIS — E042 Nontoxic multinodular goiter: Secondary | ICD-10-CM | POA: Diagnosis not present

## 2016-11-14 DIAGNOSIS — E0590 Thyrotoxicosis, unspecified without thyrotoxic crisis or storm: Secondary | ICD-10-CM

## 2016-11-14 LAB — TSH: TSH: 0.01 u[IU]/mL — AB (ref 0.35–4.50)

## 2016-11-14 LAB — T4, FREE: FREE T4: 0.9 ng/dL (ref 0.60–1.60)

## 2016-11-14 LAB — T3, FREE: T3, Free: 3.3 pg/mL (ref 2.3–4.2)

## 2016-11-14 NOTE — Progress Notes (Signed)
Patient ID: Katelyn Price, female   DOB: October 06, 1960, 56 y.o.   MRN: [id number]    HPI  Katelyn Price is a 56 y.o.-year-old female, returning for f/u for Graves ds. and thyroid nodules. Last visit 6 mo ago.  I reviewed pt's thyroid tests: Lab Results  Component Value Date   TSH 0.01 (L) 09/05/2016   TSH 0.02 (L) 07/25/2016   TSH 3.07 06/15/2016   TSH 2.26 05/13/2016   TSH 1.21 03/23/2016   TSH 0.20 (L) 02/15/2016   TSH 0.12 (L) 01/14/2016   TSH 0.32 (L) 10/14/2015   TSH 0.03 (L) 08/25/2015   TSH 0.02 (L) 08/21/2015   FREET4 0.90 09/05/2016   FREET4 1.03 07/25/2016   FREET4 0.65 06/15/2016   FREET4 0.51 (L) 05/13/2016   FREET4 0.55 (L) 03/23/2016   FREET4 0.42 (L) 02/15/2016   FREET4 0.61 01/14/2016   FREET4 1.56 10/14/2015   FREET4 1.55 08/25/2015   FREET4 0.85 06/14/2010   She had elevated thyrotropin receptor antibodies and TSIs: Component     Latest Ref Rng & Units 08/25/2015 10/14/2015  Thyrotropin Receptor Ab     <=16.0 % 41.4 (H)   TSI     <140 % baseline  630 (H)   Thyroid ultrasound on 09/04/2015: Right thyroid lobe: 4.1 cm x 1.7 cm x 1.9 cm. Relatively homogeneous appearance of right thyroid tissue.  Right thyroid nodule #1 measures 0.7 cm x 0.7 cm x 0.7 cm mixed cystic and solid composition (1), with hypoechoic  echogenicity (2), taller than wide shape (3) Left thyroid lobe: 5.1 cm x 1.9 cm x 2.4 cm. Relatively homogeneous appearance of the left thyroid.  *Inferior left nodule #1 measures 1.5 cm x 1.1 cm x 1.3 cm Nearly completely solid composition (2), with isoechoic echogenicity (1).   There are 3 additional separate nodules all measuring less than 5 mm with characteristics compatible with colloid cysts. Isthmus Thickness: 0.4 cm.  No nodules visualized. Lymphadenopathy: None visualized.  Thyroid Uptake and scan (10/22/2015) >> Graves ds.: 24 hour radioactive iodine uptake 36.9%.Diffuse radiotracer uptake identified within both lobes of the thyroid  gland. No dominant hot or cold nodule.   She was initially started on Methimazole 10 mg 2x a day >> decreased to 5 mg 2x a day 02/2016 >> TFTs normalized >> we were able to decrease the dose to 5 mg daily  And even tried lower doses but her TSH became suppressed on <5 mg MMI daily.  She is also on Propranolol (Inderal 80 mg at bedtime) for tremors. She is on Lithium for bipolar disorder.  Pt denies: - feeling nodules in neck - hoarseness - choking - SOB with lying down - + occasional dysphagia   Pt does have a FH of thyroid ds on father's side of the family.No FH of thyroid cancer. No h/o radiation tx to head or neck.  No seaweed or kelp. No recent contrast studies. No herbal supplements. No Biotin use. No recent steroids use.   Pt. also has a history of GERD, psoriasis, bipolar dis., HTN, B12 def.  ROS: Constitutional: no weight gain/no weight loss, no fatigue, + subjective hyperthermia, no subjective hypothermia Eyes: no blurry vision, no xerophthalmia ENT: no sore throat, + see HPI Cardiovascular: no CP/+ occas. SOB/no palpitations/no leg swelling Respiratory: no cough/+ occas.  SOB/no wheezing Gastrointestinal: + N/no V/no D/no C/no acid reflux Musculoskeletal: no muscle aches/no joint aches Skin: no rashes, no hair loss Neurological: no tremors/no numbness/no tingling/no dizziness, + HA +  menstrual cycle pb.  I reviewed pt's medications, allergies, PMH, social hx, family hx, and changes were documented in the history of present illness. Otherwise, unchanged from my initial visit note.  Past Medical History:  Diagnosis Date  . Abnormal involuntary movements(781.0)   . Acne   . Allergic rhinitis   . Bipolar disorder, unspecified ([place])    psych - Dr. Lattie Haw  . Diaphragmatic hernia without mention of obstruction or gangrene   . GERD (gastroesophageal reflux disease)    03/18/02, EGD, [facility] Gerd,gastritis, dilated  . History of MRI of brain and brain stem 06/01/03   [facility]  .  Hypertension   . Other psoriasis   . Unspecified sinusitis (chronic)    Past Surgical History:  Procedure Laterality Date  . COLONOSCOPY  03/18/02   internal hemorrhoids  . COLONOSCOPY  06/2014   diverticulosis, rpt 10 yrs Collene Mares)  . ESOPHAGOGASTRODUODENOSCOPY  1/99   Sharlett Iles; HH,GERD  . ESOPHAGOGASTRODUODENOSCOPY  03/18/02   HH,GERD,Gastritis; Dilated  . LAPAROSCOPIC ESOPHAGOGASTRIC FUNDOPLASTY  08/28/02   Hassell Done   Social History   Social History  . Marital status: Married    Spouse name: N/A  . Number of children: 0   Occupational History  . Housewife    Social History Main Topics  . Smoking status: Former Smoker    Packs/day: 1.00    Years: 20.00    Types: Cigarettes    Quit date: 01/10/1998  . Smokeless tobacco: Never Used  . Alcohol use No  . Drug use: No   Social History Narrative   Caffeine: drinks diet drinks   Lives with husband and 1 dog, 1 cat   Occupation: housewife   Activity: no regular exercise, doesn't feel safe to walk outside at home, could walk on mother's treadmill   Diet: good water, fruits/vegetables some    Current Outpatient Medications on File Prior to Visit  Medication Sig Dispense Refill  . buPROPion (WELLBUTRIN XL) 150 MG 24 hr tablet Take 150 mg by mouth daily.     . busPIRone (BUSPAR) 15 MG tablet Take 7.5 mg by mouth 3 (three) times daily.     . Ca & Phos-Vit D-Mag (CALCIUM) [phone] TABS Take by mouth.    . Cholecalciferol (VITAMIN D) 2000 UNITS CAPS Take 1 capsule by mouth daily.     . citalopram (CELEXA) 10 MG tablet Take 20 mg by mouth daily.     . clindamycin (CLEOCIN T) 1 % lotion Apply 1 application topically daily.    Marland Kitchen dexlansoprazole (DEXILANT) 60 MG capsule Take 1 capsule (60 mg total) by mouth daily. 90 capsule 1  . estradiol (ESTRACE) 2 MG tablet Take 2 mg by mouth daily.    Marland Kitchen guanFACINE (TENEX) 1 MG tablet Take 1 mg by mouth 2 (two) times daily.    Marland Kitchen lisinopril (PRINIVIL,ZESTRIL) 10 MG tablet TAKE 1 TABLET (10 MG  TOTAL) BY MOUTH DAILY. 90 tablet 3  . lithium carbonate (LITHOBID) 300 MG CR tablet Take 300 mg by mouth 2 (two) times daily.    . methimazole (TAPAZOLE) 5 MG tablet Take 5 mg by mouth daily 60 tablet 1  . pantoprazole (PROTONIX) 40 MG tablet TAKE 1 TABLET (40 MG TOTAL) BY MOUTH DAILY. 90 tablet 3  . polyethylene glycol (MIRALAX / GLYCOLAX) packet Take 17 g by mouth daily.    . progesterone (PROMETRIUM) 100 MG capsule Take 100 mg by mouth at bedtime.  12  . propranolol (INDERAL) 80 MG tablet Take 80 mg by mouth at  bedtime.    . psyllium (METAMUCIL) 58.6 % packet Take 1 packet by mouth daily.    Marland Kitchen rOPINIRole (REQUIP) 1 MG tablet Take 1 mg by mouth 3 (three) times daily.    . vitamin B-12 (CYANOCOBALAMIN) 500 MCG tablet Take 2,000 mcg by mouth daily.     . ziprasidone (GEODON) 80 MG capsule Take 240 mg by mouth at bedtime.      No current facility-administered medications on file prior to visit.    Allergies  Allergen Reactions  . Hydrocodone-Homatropine     REACTION: unspecified  . Minocycline Hcl     REACTION: Itching   Family History  Problem Relation Age of Onset  . Arthritis Mother        fibromyalgia  . Alcohol abuse Father   . Emphysema Brother        smoker  . COPD Brother        smoker, continues.  . Arthritis Sister        fibromyalgia  . Cancer Neg Hx   . Coronary artery disease Neg Hx   . Stroke Neg Hx   . Diabetes Neg Hx    PE: BP 124/80   Pulse 60   Ht 5' 1.5" (1.562 m)   Wt 185 lb (83.9 kg)   SpO2 96%   BMI 34.39 kg/m  Wt Readings from Last 3 Encounters:  11/14/16 185 lb (83.9 kg)  08/25/16 179 lb 8 oz (81.4 kg)  05/13/16 185 lb (83.9 kg)   Constitutional: overweight, in NAD Eyes: PERRLA, EOMI, no exophthalmos ENT: moist mucous membranes, no thyromegaly, no cervical lymphadenopathy Cardiovascular: RRR, No MRG Respiratory: CTA B Gastrointestinal: abdomen soft, NT, ND, BS+ Musculoskeletal: no deformities, strength intact in all 4 Skin: moist, warm,  no rashes Neurological: + tremor with outstretched hands, DTR normal in all 4  ASSESSMENT: 1. Thyrotoxicosis  2. Thyroid nodules  PLAN:  1. Patient with thyrotoxicosis diagnosed in summer 2017, with thyrotoxic symptoms: Tremors (she does have lithium-induced tremors at baseline, but these exacerbated secondary to hyperthyroidism) heat intolerance, palpitations, shortness of breath. Symptoms improved after starting methimazole and we were able to decrease the dose, currently at 5 mg daily. Of note, she could not tolerate lower doses, As her TSH became suppressed again when we tried to decrease the dose. She is still asymptomatic. Her tremors are still present, though. - We discussed about different modalities of treatment for Graves' disease to include methimazole use, radioactive iodine ablation or last resort, surgery. As MMI seems to be effective for her, we can continue for now at the current dose and stay on this for a longer time, then decrease this to the lowest possible dose and she can remain on this for as long as necessary per the latest studies.  - Today we will check a TSH, free T4, free T3 and we will adjust the methimazole dose accordingly - she is on beta blocker for tremors >> continue - RTC in 6 mo, but sooner for labs  2. Thyroid nodules - Patient has a 1.5 cm iso-slightly hypoechoic nodule, with internal blood flow. The nodule appeared warm on uptake and scan, which can either indicate an inflammatory nodule or a normal functioning one -  no biopsy is needed at this point since this can be a pseudo-nodule, but I plan to keep an eye on this and repeat ultrasound approximately one year after she becomes euthyroid.  Needs refills of MMI.  Component     Latest Ref Rng &  Units 11/14/2016  TSH     0.35 - 4.50 uIU/mL 0.01 (L)  T4,Free(Direct)     0.60 - 1.60 ng/dL 0.90  Triiodothyronine,Free,Serum     2.3 - 4.2 pg/mL 3.3  TSI     <140 % baseline 442 (H)   TSIs still elevated  but improved. TSH still low. Free thyroid hhs normal. TSH still suppressed, but asymptomatic >> continue current MMI dose for now and repeat labs in 5 weeks. We may need to increase the dose then if TSH still low.  Katelyn Kingdom, MD PhD [facility]

## 2016-11-14 NOTE — Patient Instructions (Signed)
Please continue Methimazole 5 mg daily.  Please stop at the lab.  Please come back for a follow-up appointment in 6 months.  

## 2016-11-16 LAB — THYROID STIMULATING IMMUNOGLOBULIN: TSI: 442 % baseline — ABNORMAL HIGH (ref <140)

## 2016-11-17 ENCOUNTER — Other Ambulatory Visit: Payer: Self-pay

## 2016-11-17 ENCOUNTER — Encounter: Payer: Self-pay | Admitting: Internal Medicine

## 2016-11-17 MED ORDER — METHIMAZOLE 5 MG PO TABS: ORAL_TABLET | ORAL | 1 refills | Status: DC

## 2016-12-19 ENCOUNTER — Other Ambulatory Visit: Payer: 59

## 2016-12-22 ENCOUNTER — Other Ambulatory Visit: Payer: 59

## 2016-12-27 ENCOUNTER — Other Ambulatory Visit: Payer: 59

## 2016-12-28 ENCOUNTER — Other Ambulatory Visit (INDEPENDENT_AMBULATORY_CARE_PROVIDER_SITE_OTHER): Payer: 59

## 2016-12-28 ENCOUNTER — Other Ambulatory Visit: Payer: Self-pay | Admitting: Internal Medicine

## 2016-12-28 DIAGNOSIS — E0590 Thyrotoxicosis, unspecified without thyrotoxic crisis or storm: Secondary | ICD-10-CM

## 2016-12-28 LAB — T3, FREE: T3 FREE: 4.1 pg/mL (ref 2.3–4.2)

## 2016-12-28 LAB — T4, FREE: Free T4: 0.93 ng/dL (ref 0.60–1.60)

## 2016-12-28 LAB — TSH: TSH: <0.01 u[IU]/mL — ABNORMAL LOW (ref 0.35–4.50)

## 2016-12-28 MED ORDER — METHIMAZOLE 5 MG PO TABS: ORAL_TABLET | ORAL | 3 refills | Status: DC

## 2017-01-05 ENCOUNTER — Ambulatory Visit: Payer: Self-pay

## 2017-01-05 NOTE — Telephone Encounter (Signed)
Pt calling requesting an appt to be seen for migraine. Pt states she has a h/o migraines. Pt c/o nausea. Pt has been taking 3 extra strength acetaminophen for migraines and taking 4  Tablets of Ibuprofen. Care advice given (including acetaminophen and ibuprofen) Pt denies numbness, tingling on either side of the face or body. She denies eye pain or stiff neck.  Appt made for tomorrow for 3:15 with Dr Lorelei Pont. Asked if I could call office to see if she soul be worked in and pt refused. Advised pt to call 911 or have someone drive her to the nearest ED. Reason for Disposition . [1] SEVERE headache (e.g., excruciating) AND [2] not improved after 2 hours of pain medicine  Answer Assessment - Initial Assessment Questions 1. LOCATION: "Where does it hurt?"      Top of the head 2. ONSET: "When did the headache start?" (Minutes, hours or days)      This am  3. PATTERN: "Does the pain come and go, or has it been constant since it started?"     Comes and goes - awake theres no headache then 1-2 later develops headache 4. SEVERITY: "How bad is the pain?" and "What does it keep you from doing?"  (e.g., Scale 1-10; mild, moderate, or severe)   - MILD (1-3): doesn't interfere with normal activities    - MODERATE (4-7): interferes with normal activities or awakens from sleep    - SEVERE (8-10): excruciating pain, unable to do any normal activities        Severe  5. RECURRENT SYMPTOM: "Have you ever had headaches before?" If so, ask: "When was the last time?" and "What happened that time?"      Yes migraine when a child. Yesterday took 3 tylenol.  6. CAUSE: "What do you think is causing the headache?"     Pt does not know 7. MIGRAINE: "Have you been diagnosed with migraine headaches?" If so, ask: "Is this headache similar?"      Yes was dx with migraine- this migraine is worse. 8. HEAD INJURY: "Has there been any recent injury to the head?"      no 9. OTHER SYMPTOMS: "Do you have any other symptoms?" (fever,  stiff neck, eye pain, sore throat, cold symptoms)     nausea 10. PREGNANCY: "Is there any chance you are pregnant?" "When was your last menstrual period?"       n/a  Protocols used: HEADACHE-A-AH

## 2017-01-06 ENCOUNTER — Encounter: Payer: Self-pay | Admitting: Family Medicine

## 2017-01-06 ENCOUNTER — Ambulatory Visit (INDEPENDENT_AMBULATORY_CARE_PROVIDER_SITE_OTHER): Payer: 59 | Admitting: Family Medicine

## 2017-01-06 ENCOUNTER — Other Ambulatory Visit: Payer: Self-pay

## 2017-01-06 VITALS — BP 130/90 | HR 58 | Temp 98.7°F | Ht 61.75 in | Wt 191.5 lb

## 2017-01-06 DIAGNOSIS — G43009 Migraine without aura, not intractable, without status migrainosus: Secondary | ICD-10-CM

## 2017-01-06 DIAGNOSIS — G251 Drug-induced tremor: Secondary | ICD-10-CM | POA: Diagnosis not present

## 2017-01-06 DIAGNOSIS — Z79899 Other long term (current) drug therapy: Secondary | ICD-10-CM

## 2017-01-06 DIAGNOSIS — T3995XA Adverse effect of unspecified nonopioid analgesic, antipyretic and antirheumatic, initial encounter: Secondary | ICD-10-CM

## 2017-01-06 DIAGNOSIS — G4440 Drug-induced headache, not elsewhere classified, not intractable: Secondary | ICD-10-CM | POA: Diagnosis not present

## 2017-01-06 MED ORDER — PROPRANOLOL HCL ER 120 MG PO CP24: 120.0000 mg | ORAL_CAPSULE | Freq: Every day | ORAL | 3 refills | Status: DC

## 2017-01-06 NOTE — Telephone Encounter (Signed)
noted 

## 2017-01-06 NOTE — Progress Notes (Signed)
Dr. Frederico Hamman T. Roderick Sweezy, MD, [facility] [place], [address] Phone: [phone] Fax: [phone]  01/06/2017  Patient: Katelyn Price, MRN: [id number], DOB: 23-Aug-1960, 56 y.o.  Primary Physician:  Ria Bush, MD   Chief Complaint  Patient presents with  . Migraine    Does not currently have a headache   Subjective:   Katelyn Price is a 56 y.o. very pleasant female patient who presents with the following:  HA multiple this week. Recently started back with some migraines.  Historically, she has had migraines.  For an extended period of time they were not really bothersome to her.  She does take Inderal 80 mg, and this is provided by her psychiatrist.  Of note, recently she has been taking a lot of Tylenol as well as ibuprofen.  She has been cycling these every 2-3 hours.  The patient also does take lithium.  Currently, she does not really have any significant headaches.  She does have a noteworthy tremor at baseline.  Call Theodoro Grist at [facility].  Past Medical History, Surgical History, Social History, Family History, Problem List, Medications, and Allergies have been reviewed and updated if relevant.  Patient Active Problem List   Diagnosis Date Noted  . Multiple thyroid nodules 10/14/2015  . Hyperthyroidism 08/25/2015  . Hypercalcemia 07/28/2013  . B12 deficiency 09/05/2012  . Healthcare maintenance 06/23/2011  . ALLERGIC RHINITIS, SEASONAL 04/10/2009  . Tremor due to multiple drugs 07/15/2008  . Impaired fasting glucose 06/07/2007  . PSORIASIS NEC 06/01/2006  . Bipolar disorder ([place]) 05/31/2006  . HYPERTENSION, BENIGN ESSENTIAL 05/31/2006  . GERD (gastroesophageal reflux disease) 05/31/2006  . HIATAL HERNIA 05/31/2006    Past Medical History:  Diagnosis Date  . Abnormal involuntary movements(781.0)   . Acne   . Allergic rhinitis   . Bipolar disorder, unspecified ([place])    psych - Dr.  Lattie Haw  . Diaphragmatic hernia without mention of obstruction or gangrene   . GERD (gastroesophageal reflux disease)    03/18/02, EGD, H.H., Gerd,gastritis, dilated  . History of MRI of brain and brain stem 06/01/03   wnl  . Hypertension   . Other psoriasis   . Unspecified sinusitis (chronic)     Past Surgical History:  Procedure Laterality Date  . COLONOSCOPY  03/18/02   internal hemorrhoids  . COLONOSCOPY  06/2014   diverticulosis, rpt 10 yrs Collene Mares)  . ESOPHAGOGASTRODUODENOSCOPY  1/99   Sharlett Iles; HH,GERD  . ESOPHAGOGASTRODUODENOSCOPY  03/18/02   HH,GERD,Gastritis; Dilated  . LAPAROSCOPIC ESOPHAGOGASTRIC FUNDOPLASTY  08/28/02   Hassell Done    Social History   Socioeconomic History  . Marital status: Married    Spouse name: Not on file  . Number of children: 0  . Years of education: Not on file  . Highest education level: Not on file  Social Needs  . Financial resource strain: Not on file  . Food insecurity - worry: Not on file  . Food insecurity - inability: Not on file  . Transportation needs - medical: Not on file  . Transportation needs - non-medical: Not on file  Occupational History  . Occupation: Housewife  Tobacco Use  . Smoking status: Former Smoker    Packs/day: 1.00    Years: 20.00    Pack years: 20.00    Types: Cigarettes    Last attempt to quit: 01/10/1998    Years since quitting: 19.0  . Smokeless tobacco: Never Used  Substance and  Sexual Activity  . Alcohol use: No  . Drug use: No  . Sexual activity: No    Comment: quit sexually with partner approx 1995  Other Topics Concern  . Not on file  Social History Narrative   Caffeine: drinks diet drinks   Lives with husband and 1 dog, 1 cat   Occupation: housewife   Activity: no regular exercise, doesn't feel safe to walk outside at home, could walk on mother's treadmill   Diet: good water, fruits/vegetables some     Family History  Problem Relation Age of Onset  . Arthritis Mother        fibromyalgia  .  Alcohol abuse Father   . Emphysema Brother        smoker  . COPD Brother        smoker, continues.  . Arthritis Sister        fibromyalgia  . Cancer Neg Hx   . Coronary artery disease Neg Hx   . Stroke Neg Hx   . Diabetes Neg Hx     Allergies  Allergen Reactions  . Hydrocodone-Homatropine     REACTION: unspecified  . Minocycline Hcl     REACTION: Itching   Medication list reviewed and updated in full in [facility].  GEN: No acute illnesses, no fevers, chills. GI: No n/v/d, eating normally Pulm: No SOB Interactive and getting along well at home.  Otherwise, ROS is as per the HPI.  Objective:   BP 130/90   Pulse (!) 58   Temp 98.7 F (37.1 C) (Oral)   Ht 5' 1.75" (1.568 m)   Wt 191 lb 8 oz (86.9 kg)   BMI 35.31 kg/m   GEN: WDWN, NAD, Non-toxic, A & O x 3 HEENT: Atraumatic, Normocephalic. Neck supple. No masses, No LAD. Ears and Nose: No external deformity. CV: RRR, No M/G/R. No JVD. No thrill. No extra heart sounds. PULM: CTA B, no wheezes, crackles, rhonchi. No retractions. No resp. distress. No accessory muscle use. EXTR: No c/c/e NEURO Normal gait. Diffuse resting tremor. PSYCH: Normally interactive. Conversant. Not depressed or anxious appearing.  Calm demeanor.   Laboratory and Imaging Data:  Assessment and Plan:   Analgesic rebound headache  Migraine without aura and without status migrainosus, not intractable  Tremor due to multiple drugs  Lithium use  History is significant for analgesic rebound headache.  This may have exacerbated a prior migraine history as well, but now that she has discontinued her Tylenol and Motrin, her headaches have resolved.  I strongly cautioned her not to combine NSAIDs with lithium, which can increase the lithium blood level from anywhere to 50% to 100%.  Tylenol is preferential as an abortive headache medication.  She has never used any triptan's in the past.  I recommended increasing the patient's Inderal to  120 mg as a preventative migraine medication, which I think has the potential to help her tremor as well.  She asked me to alert her psychiatric practitioner, so I will send them a copy of my note to [facility] and attempt to place a phone call.  Follow-up: No Follow-up on file.  Meds ordered this encounter  Medications  . propranolol ER (INDERAL LA) 120 MG 24 hr capsule    Sig: Take 1 capsule (120 mg total) by mouth daily.    Dispense:  30 capsule    Refill:  3   Medications Discontinued During This Encounter  Medication Reason  . dexlansoprazole (DEXILANT) 60 MG capsule Cost  of medication  . propranolol (INDERAL) 80 MG tablet    Signed,  Leitha Hyppolite T. Ilai Hiller, MD   Allergies as of 01/06/2017      Reactions   Hydrocodone-homatropine    REACTION: unspecified   Minocycline Hcl    REACTION: Itching      Medication List        Accurate as of 01/06/17 11:59 PM. Always use your most recent med list.          busPIRone 15 MG tablet Commonly known as:  BUSPAR Take 7.5 mg by mouth 3 (three) times daily.   Calcium [phone] Tabs Take by mouth.   citalopram 10 MG tablet Commonly known as:  CELEXA Take 20 mg by mouth daily.   clindamycin 1 % lotion Commonly known as:  CLEOCIN T Apply 1 application topically daily.   estradiol 2 MG tablet Commonly known as:  ESTRACE Take 2 mg by mouth daily.   GEODON 80 MG capsule Generic drug:  ziprasidone Take 240 mg by mouth at bedtime.   guanFACINE 1 MG tablet Commonly known as:  TENEX Take 1 mg by mouth 2 (two) times daily.   lisinopril 10 MG tablet Commonly known as:  PRINIVIL,ZESTRIL TAKE 1 TABLET (10 MG TOTAL) BY MOUTH DAILY.   lithium carbonate 300 MG CR tablet Commonly known as:  LITHOBID Take 300 mg by mouth 2 (two) times daily.   methimazole 5 MG tablet Commonly known as:  TAPAZOLE Take 5 mg in am and 2.5 mg in pm   pantoprazole 40 MG tablet Commonly known as:  PROTONIX TAKE 1 TABLET (40 MG  TOTAL) BY MOUTH DAILY.   polyethylene glycol packet Commonly known as:  MIRALAX / GLYCOLAX Take 17 g by mouth daily.   progesterone 100 MG capsule Commonly known as:  PROMETRIUM Take 100 mg by mouth at bedtime.   propranolol ER 120 MG 24 hr capsule Commonly known as:  INDERAL LA Take 1 capsule (120 mg total) by mouth daily.   psyllium 58.6 % packet Commonly known as:  METAMUCIL Take 1 packet by mouth daily.   rOPINIRole 1 MG tablet Commonly known as:  REQUIP Take 1 mg by mouth 3 (three) times daily.   vitamin B-12 500 MCG tablet Commonly known as:  CYANOCOBALAMIN Take 2,000 mcg by mouth daily.   Vitamin D 2000 units Caps Take 1 capsule by mouth daily.   WELLBUTRIN XL 150 MG 24 hr tablet Generic drug:  buPROPion Take 150 mg by mouth daily.

## 2017-01-07 ENCOUNTER — Encounter: Payer: Self-pay | Admitting: Family Medicine

## 2017-02-09 ENCOUNTER — Other Ambulatory Visit (INDEPENDENT_AMBULATORY_CARE_PROVIDER_SITE_OTHER): Payer: 59

## 2017-02-09 ENCOUNTER — Other Ambulatory Visit: Payer: 59

## 2017-02-09 ENCOUNTER — Other Ambulatory Visit: Payer: Self-pay | Admitting: Internal Medicine

## 2017-02-09 DIAGNOSIS — E0590 Thyrotoxicosis, unspecified without thyrotoxic crisis or storm: Secondary | ICD-10-CM

## 2017-02-09 LAB — TSH: TSH: <0.01 u[IU]/mL — ABNORMAL LOW (ref 0.35–4.50)

## 2017-02-09 LAB — T4, FREE: Free T4: 0.84 ng/dL (ref 0.60–1.60)

## 2017-02-09 LAB — T3, FREE: T3, Free: 3.4 pg/mL (ref 2.3–4.2)

## 2017-02-09 MED ORDER — METHIMAZOLE 5 MG PO TABS: ORAL_TABLET | ORAL | 3 refills | Status: DC

## 2017-02-10 ENCOUNTER — Other Ambulatory Visit: Payer: Self-pay

## 2017-02-10 MED ORDER — METHIMAZOLE 5 MG PO TABS: ORAL_TABLET | ORAL | 0 refills | Status: DC

## 2017-02-20 ENCOUNTER — Encounter: Payer: Self-pay | Admitting: Internal Medicine

## 2017-02-20 MED ORDER — METHIMAZOLE 5 MG PO TABS: ORAL_TABLET | ORAL | 0 refills | Status: DC

## 2017-03-08 ENCOUNTER — Telehealth: Payer: Self-pay | Admitting: *Deleted

## 2017-03-08 NOTE — Telephone Encounter (Signed)
Received request for Medical Records from [facility] and [place]; forwarded to [place] for email/scan/SLS

## 2017-03-10 LAB — HM PAP SMEAR: HM PAP: NORMAL

## 2017-03-10 LAB — HM MAMMOGRAPHY: HM Mammogram: NORMAL (ref 0–4)

## 2017-03-17 ENCOUNTER — Other Ambulatory Visit: Payer: Self-pay | Admitting: Internal Medicine

## 2017-03-17 ENCOUNTER — Other Ambulatory Visit (INDEPENDENT_AMBULATORY_CARE_PROVIDER_SITE_OTHER): Payer: 59

## 2017-03-17 DIAGNOSIS — E0590 Thyrotoxicosis, unspecified without thyrotoxic crisis or storm: Secondary | ICD-10-CM

## 2017-03-17 LAB — TSH: TSH: 0.02 u[IU]/mL — AB (ref 0.35–4.50)

## 2017-03-17 LAB — T3, FREE: T3, Free: 2.9 pg/mL (ref 2.3–4.2)

## 2017-03-17 LAB — T4, FREE: Free T4: 0.64 ng/dL (ref 0.60–1.60)

## 2017-04-25 ENCOUNTER — Other Ambulatory Visit (INDEPENDENT_AMBULATORY_CARE_PROVIDER_SITE_OTHER): Payer: 59

## 2017-04-25 DIAGNOSIS — E0590 Thyrotoxicosis, unspecified without thyrotoxic crisis or storm: Secondary | ICD-10-CM | POA: Diagnosis not present

## 2017-04-25 LAB — T4, FREE: Free T4: 0.53 ng/dL — ABNORMAL LOW (ref 0.60–1.60)

## 2017-04-25 LAB — T3, FREE: T3, Free: 3.7 pg/mL (ref 2.3–4.2)

## 2017-04-25 LAB — TSH: TSH: 0.2 u[IU]/mL — AB (ref 0.35–4.50)

## 2017-05-17 ENCOUNTER — Encounter: Payer: Self-pay | Admitting: Internal Medicine

## 2017-05-17 ENCOUNTER — Ambulatory Visit (INDEPENDENT_AMBULATORY_CARE_PROVIDER_SITE_OTHER): Payer: 59 | Admitting: Internal Medicine

## 2017-05-17 VITALS — BP 116/68 | HR 53 | Ht 61.75 in | Wt 177.4 lb

## 2017-05-17 DIAGNOSIS — E042 Nontoxic multinodular goiter: Secondary | ICD-10-CM

## 2017-05-17 DIAGNOSIS — E0590 Thyrotoxicosis, unspecified without thyrotoxic crisis or storm: Secondary | ICD-10-CM

## 2017-05-17 NOTE — Progress Notes (Addendum)
Patient ID: Katelyn Price, female   DOB: January 30, 1960, 57 y.o.   MRN: [id number]    HPI  Katelyn Price is a 57 y.o.-year-old female, returning for f/u for Graves ds. and thyroid nodules. Last visit 6 months ago.  Patient was diagnosed with thyrotoxicosis in summer 2017, and was confirmed as Graves' disease on the uptake and scan from 10/2015.  I reviewed patient's TFTs: Lab Results  Component Value Date   TSH 0.20 (L) 04/25/2017   TSH 0.02 (L) 03/17/2017   TSH <0.01 (L) 02/09/2017   TSH <0.01 (L) 12/28/2016   TSH 0.01 (L) 11/14/2016   TSH 0.01 (L) 09/05/2016   TSH 0.02 (L) 07/25/2016   TSH 3.07 06/15/2016   TSH 2.26 05/13/2016   TSH 1.21 03/23/2016   FREET4 0.53 (L) 04/25/2017   FREET4 0.64 03/17/2017   FREET4 0.84 02/09/2017   FREET4 0.93 12/28/2016   FREET4 0.90 11/14/2016   FREET4 0.90 09/05/2016   FREET4 1.03 07/25/2016   FREET4 0.65 06/15/2016   FREET4 0.51 (L) 05/13/2016   FREET4 0.55 (L) 03/23/2016   She had elevated thyrotropin receptor antibodies and TSI's: Component     Latest Ref Rng & Units 08/25/2015  Thyrotropin Receptor Ab     <=16.0 % 41.4 (H)   Lab Results  Component Value Date   TSI 442 (H) 11/14/2016   TSI 630 (H) 10/14/2015   Reviewed the reports and images of her imaging tests: Thyroid ultrasound on 09/04/2015: Right thyroid lobe: 4.1 cm x 1.7 cm x 1.9 cm. Relatively homogeneous appearance of right thyroid tissue.  Right thyroid nodule #1 measures 0.7 cm x 0.7 cm x 0.7 cm mixed cystic and solid composition (1), with hypoechoic  echogenicity (2), taller than wide shape (3) Left thyroid lobe: 5.1 cm x 1.9 cm x 2.4 cm. Relatively homogeneous appearance of the left thyroid.  *Inferior left nodule #1 measures 1.5 cm x 1.1 cm x 1.3 cm Nearly completely solid composition (2), with isoechoic echogenicity (1).   There are 3 additional separate nodules all measuring less than 5 mm with characteristics compatible with colloid cysts. Isthmus Thickness:  0.4 cm.  No nodules visualized. Lymphadenopathy: None visualized.  Thyroid Uptake and scan (10/22/2015) >> Graves ds.: 24 hour radioactive iodine uptake 36.9%.Diffuse radiotracer uptake identified within both lobes of the thyroid gland. No dominant hot or cold nodule.   She was initially started on methimazole 10 mg twice a day, then decreased to 5 mg twice a day in 02/2016 after which TFTs normalized.  We were able to decrease the dose further to 5 mg daily.  We have tried an even lower dose but her TSH became suppressed on less than 5 mg methimazole daily.  As of now, she is on 5 mg 2x a day of methimazole - dose increased 01/2017.  She is also on propranolol 80 mg at bedtime  for tremors.  She is on lithium for bipolar disorder  Pt denies: - feeling nodules in neck - hoarseness - dysphagia - choking - SOB with lying down  Pt does have a FH of thyroid ds on father's side of the family. No FH of thyroid cancer. No h/o radiation tx to head or neck.  No seaweed or kelp. No recent contrast studies. No herbal supplements. No Biotin use. No recent steroids use.   Pt. also has a history of GERD, psoriasis, bipolar dis.,  HTN, B12 def.  ROS: Constitutional: no weight gain/no weight loss, + fatigue, +  subjective hyperthermia, no subjective hypothermia, + nocturia Eyes: no blurry vision, no xerophthalmia ENT: no sore throat, + see HPI Cardiovascular: no CP/no SOB/no palpitations/no leg swelling Respiratory: no cough/no SOB/no wheezing Gastrointestinal: + N/no V/no D/no C/+ acid reflux Musculoskeletal: no muscle aches/no joint aches Skin: no rashes, no hair loss Neurological: + tremors/no numbness/no tingling/no dizziness  I reviewed pt's medications, allergies, PMH, social hx, family hx, and changes were documented in the history of present illness. Otherwise, unchanged from my initial visit note.  Past Medical History:  Diagnosis Date  . Abnormal involuntary movements(781.0)   . Acne     . Allergic rhinitis   . Bipolar disorder, unspecified ([place])    psych - Dr. Lattie Haw  . Diaphragmatic hernia without mention of obstruction or gangrene   . GERD (gastroesophageal reflux disease)    03/18/02, EGD, H.H., Gerd,gastritis, dilated  . History of MRI of brain and brain stem 06/01/03   wnl  . Hypertension   . Other psoriasis   . Unspecified sinusitis (chronic)    Past Surgical History:  Procedure Laterality Date  . COLONOSCOPY  03/18/02   internal hemorrhoids  . COLONOSCOPY  06/2014   diverticulosis, rpt 10 yrs Collene Mares)  . ESOPHAGOGASTRODUODENOSCOPY  1/99   Sharlett Iles; HH,GERD  . ESOPHAGOGASTRODUODENOSCOPY  03/18/02   HH,GERD,Gastritis; Dilated  . LAPAROSCOPIC ESOPHAGOGASTRIC FUNDOPLASTY  08/28/02   Hassell Done   Social History   Social History  . Marital status: Married    Spouse name: N/A  . Number of children: 0   Occupational History  . Housewife    Social History Main Topics  . Smoking status: Former Smoker    Packs/day: 1.00    Years: 20.00    Types: Cigarettes    Quit date: 01/10/1998  . Smokeless tobacco: Never Used  . Alcohol use No  . Drug use: No   Social History Narrative   Caffeine: drinks diet drinks   Lives with husband and 1 dog, 1 cat   Occupation: housewife   Activity: no regular exercise, doesn't feel safe to walk outside at home, could walk on mother's treadmill   Diet: good water, fruits/vegetables some    Current Outpatient Medications on File Prior to Visit  Medication Sig Dispense Refill  . buPROPion (WELLBUTRIN XL) 150 MG 24 hr tablet Take 150 mg by mouth daily.     . busPIRone (BUSPAR) 15 MG tablet Take 7.5 mg by mouth 3 (three) times daily.     . Ca & Phos-Vit D-Mag (CALCIUM) [phone] TABS Take by mouth.    . Cholecalciferol (VITAMIN D) 2000 UNITS CAPS Take 1 capsule by mouth daily.     . citalopram (CELEXA) 10 MG tablet Take 20 mg by mouth daily.     . clindamycin (CLEOCIN T) 1 % lotion Apply 1 application topically daily.    Marland Kitchen  estradiol (ESTRACE) 2 MG tablet Take 2 mg by mouth daily.    Marland Kitchen guanFACINE (TENEX) 1 MG tablet Take 1 mg by mouth 2 (two) times daily.    Marland Kitchen lisinopril (PRINIVIL,ZESTRIL) 10 MG tablet TAKE 1 TABLET (10 MG TOTAL) BY MOUTH DAILY. 90 tablet 3  . lithium carbonate (LITHOBID) 300 MG CR tablet Take 300 mg by mouth 2 (two) times daily.    . methimazole (TAPAZOLE) 5 MG tablet Take 5 mg in am and 5 mg in pm 180 tablet 0  . pantoprazole (PROTONIX) 40 MG tablet TAKE 1 TABLET (40 MG TOTAL) BY MOUTH DAILY. 90 tablet 3  . polyethylene glycol (  MIRALAX / GLYCOLAX) packet Take 17 g by mouth daily.    . progesterone (PROMETRIUM) 100 MG capsule Take 100 mg by mouth at bedtime.  12  . propranolol ER (INDERAL LA) 120 MG 24 hr capsule Take 1 capsule (120 mg total) by mouth daily. 30 capsule 3  . psyllium (METAMUCIL) 58.6 % packet Take 1 packet by mouth daily.    Marland Kitchen rOPINIRole (REQUIP) 1 MG tablet Take 1 mg by mouth 3 (three) times daily.    . vitamin B-12 (CYANOCOBALAMIN) 500 MCG tablet Take 2,000 mcg by mouth daily.     . ziprasidone (GEODON) 80 MG capsule Take 240 mg by mouth at bedtime.      No current facility-administered medications on file prior to visit.    Allergies  Allergen Reactions  . Hydrocodone-Homatropine     REACTION: unspecified  . Minocycline Hcl     REACTION: Itching   Family History  Problem Relation Age of Onset  . Arthritis Mother        fibromyalgia  . Alcohol abuse Father   . Emphysema Brother        smoker  . COPD Brother        smoker, continues.  . Arthritis Sister        fibromyalgia  . Cancer Neg Hx   . Coronary artery disease Neg Hx   . Stroke Neg Hx   . Diabetes Neg Hx    PE: BP 116/68   Pulse (!) 53   Ht 5' 1.75" (1.568 m)   Wt 177 lb 6.4 oz (80.5 kg)   SpO2 97%   BMI 32.71 kg/m  Wt Readings from Last 3 Encounters:  05/17/17 177 lb 6.4 oz (80.5 kg)  01/06/17 191 lb 8 oz (86.9 kg)  11/14/16 185 lb (83.9 kg)   Constitutional: overweight, in NAD Eyes: PERRLA,  EOMI, no exophthalmos ENT: moist mucous membranes, no thyromegaly, no cervical lymphadenopathy Cardiovascular: RRR, No MRG Respiratory: CTA B Gastrointestinal: abdomen soft, NT, ND, BS+ Musculoskeletal: no deformities, strength intact in all 4 Skin: moist, warm, no rashes+ ecchymoses R forearm Neurological: + tremor with outstretched hands, DTR normal in all 4   ASSESSMENT: 1.  Graves' disease  2. Thyroid nodules  PLAN:  1. Patient with thyrotoxicosis dx'ed summer of 2017, when she presented with thyrotoxic symptoms: Tremors (however, she does have lithium induced tremors at baseline, but these exacerbated), heat intolerance, palpitations, shortness of breath.  Symptoms improved after starting methimazole and we were able to decrease the dose, currently at 5 mg 2x a day (increased 01/2017).  Of note, she could not tolerate lower doses than <5 mg in the past as her TSH became suppressed again when we tried to decrease the dose.  She is asymptomatic except heat intolerance.  Her tremors are still present, less intense. -We again discussed about different modalities of treatment for Graves' disease to include methimazole use, radioactive iodine ablation or, last resort, surgery.  As methimazole appears to be effective for her, will continue with this now and try to stay on 5 mg or more for a longer time before trying to decrease the dose.  I would also like to check her TSI antibodies and will attempt to decrease her methimazole dose below 5 mg daily when the level of TSI's becomes normal. - in 2-3 weeks we will check a TSH, free T4, free T3 and also TSI's and adjust the methimazole dose accordingly. - She is on a beta-blocker for her tremors, which  should also help with tachycardia that can occur in the setting of hyperthyroidism - RTC in 6 mo  2. Thyroid nodules - Patient has a 1.5 cm iso or slightly hypoechoic thyroid nodule, with internal blood flow on her thyroid ultrasound from 08/2015.   The nodule appeared warm on the uptake and scan, which can either indicate an inflammatory nodule (pseudo-nodule) or a normal functioning one.  No biopsy was needed at that time since these nodules could appear in context of thyrotoxicosis.  We decided to repeat an ultrasound 1 year after she becomes euthyroid. - She does not have any neck compression symptoms  Orders Placed This Encounter  Procedures  . TSH  . T4, free  . T3, free  . Thyroid stimulating immunoglobulin   Component     Latest Ref Rng & Units 05/31/2017  TSH     0.35 - 4.50 uIU/mL 1.32  T4,Free(Direct)     0.60 - 1.60 ng/dL 0.51 (L)  Triiodothyronine,Free,Serum     2.3 - 4.2 pg/mL 3.7  TSI     <140 % baseline 499 (H)   TSIs higher, but TSH normal, fT4 slightly low. Cont. Current MMI dose and repeat TFTs in 3 months.  Philemon Kingdom, MD PhD [facility]

## 2017-05-17 NOTE — Patient Instructions (Addendum)
Please continue Methimazole 5 mg 2x a day  Please come back in 2-3 weeks for repeat labs.  Please come back for a follow-up appointment in 6 months.

## 2017-05-19 ENCOUNTER — Other Ambulatory Visit: Payer: Self-pay | Admitting: Internal Medicine

## 2017-05-31 ENCOUNTER — Other Ambulatory Visit (INDEPENDENT_AMBULATORY_CARE_PROVIDER_SITE_OTHER): Payer: 59

## 2017-05-31 DIAGNOSIS — E0590 Thyrotoxicosis, unspecified without thyrotoxic crisis or storm: Secondary | ICD-10-CM

## 2017-05-31 LAB — T3, FREE: T3, Free: 3.7 pg/mL (ref 2.3–4.2)

## 2017-05-31 LAB — T4, FREE: FREE T4: 0.51 ng/dL — AB (ref 0.60–1.60)

## 2017-05-31 LAB — TSH: TSH: 1.32 u[IU]/mL (ref 0.35–4.50)

## 2017-06-01 DIAGNOSIS — Z01419 Encounter for gynecological examination (general) (routine) without abnormal findings: Secondary | ICD-10-CM | POA: Diagnosis not present

## 2017-06-06 LAB — THYROID STIMULATING IMMUNOGLOBULIN: TSI: 499 %{baseline} — AB (ref <140)

## 2017-06-06 NOTE — Addendum Note (Signed)
Addended by: Philemon Kingdom on: 06/06/2017 05:13 PM   Modules accepted: Orders

## 2017-06-29 ENCOUNTER — Other Ambulatory Visit: Payer: 59

## 2017-08-23 ENCOUNTER — Other Ambulatory Visit (INDEPENDENT_AMBULATORY_CARE_PROVIDER_SITE_OTHER): Payer: 59

## 2017-08-23 DIAGNOSIS — E0590 Thyrotoxicosis, unspecified without thyrotoxic crisis or storm: Secondary | ICD-10-CM | POA: Diagnosis not present

## 2017-08-23 LAB — TSH: TSH: 3.27 u[IU]/mL (ref 0.35–4.50)

## 2017-08-23 LAB — T4, FREE: Free T4: 0.41 ng/dL — ABNORMAL LOW (ref 0.60–1.60)

## 2017-08-23 LAB — T3, FREE: T3, Free: 3 pg/mL (ref 2.3–4.2)

## 2017-08-27 ENCOUNTER — Other Ambulatory Visit: Payer: Self-pay | Admitting: Internal Medicine

## 2017-10-04 ENCOUNTER — Other Ambulatory Visit: Payer: Self-pay | Admitting: Family Medicine

## 2017-10-04 DIAGNOSIS — E0590 Thyrotoxicosis, unspecified without thyrotoxic crisis or storm: Secondary | ICD-10-CM

## 2017-10-04 DIAGNOSIS — E538 Deficiency of other specified B group vitamins: Secondary | ICD-10-CM

## 2017-10-04 DIAGNOSIS — F3170 Bipolar disorder, currently in remission, most recent episode unspecified: Secondary | ICD-10-CM

## 2017-10-04 DIAGNOSIS — I10 Essential (primary) hypertension: Secondary | ICD-10-CM

## 2017-10-04 DIAGNOSIS — Z1159 Encounter for screening for other viral diseases: Secondary | ICD-10-CM

## 2017-10-04 DIAGNOSIS — R7301 Impaired fasting glucose: Secondary | ICD-10-CM

## 2017-10-05 ENCOUNTER — Other Ambulatory Visit (INDEPENDENT_AMBULATORY_CARE_PROVIDER_SITE_OTHER): Payer: 59

## 2017-10-05 DIAGNOSIS — I10 Essential (primary) hypertension: Secondary | ICD-10-CM | POA: Diagnosis not present

## 2017-10-05 DIAGNOSIS — Z1159 Encounter for screening for other viral diseases: Secondary | ICD-10-CM

## 2017-10-05 DIAGNOSIS — F3170 Bipolar disorder, currently in remission, most recent episode unspecified: Secondary | ICD-10-CM | POA: Diagnosis not present

## 2017-10-05 DIAGNOSIS — E538 Deficiency of other specified B group vitamins: Secondary | ICD-10-CM | POA: Diagnosis not present

## 2017-10-05 LAB — CBC WITH DIFFERENTIAL/PLATELET
BASOS PCT: 0.8 % (ref 0.0–3.0)
Basophils Absolute: 0.1 10*3/uL (ref 0.0–0.1)
EOS PCT: 3.3 % (ref 0.0–5.0)
Eosinophils Absolute: 0.3 10*3/uL (ref 0.0–0.7)
HCT: 38 % (ref 36.0–46.0)
Hemoglobin: 12.4 g/dL (ref 12.0–15.0)
Lymphocytes Relative: 33.4 % (ref 12.0–46.0)
Lymphs Abs: 2.6 10*3/uL (ref 0.7–4.0)
MCHC: 32.8 g/dL (ref 30.0–36.0)
MCV: 94.4 fl (ref 78.0–100.0)
MONO ABS: 0.7 10*3/uL (ref 0.1–1.0)
MONOS PCT: 9.2 % (ref 3.0–12.0)
Neutro Abs: 4.2 10*3/uL (ref 1.4–7.7)
Neutrophils Relative %: 53.3 % (ref 43.0–77.0)
Platelets: 313 10*3/uL (ref 150.0–400.0)
RBC: 4.02 Mil/uL (ref 3.87–5.11)
RDW: 14.1 % (ref 11.5–15.5)
WBC: 7.9 10*3/uL (ref 4.0–10.5)

## 2017-10-05 LAB — BASIC METABOLIC PANEL
BUN: 13 mg/dL (ref 6–23)
CHLORIDE: 100 meq/L (ref 96–112)
CO2: 27 mEq/L (ref 19–32)
Calcium: 10.4 mg/dL (ref 8.4–10.5)
Creatinine, Ser: 0.96 mg/dL (ref 0.40–1.20)
GFR: 63.74 mL/min (ref >60.00)
GLUCOSE: 108 mg/dL — AB (ref 70–99)
POTASSIUM: 4.5 meq/L (ref 3.5–5.1)
SODIUM: 135 meq/L (ref 135–145)

## 2017-10-05 LAB — VITAMIN B12: Vitamin B-12: 1157 pg/mL — ABNORMAL HIGH (ref 211–911)

## 2017-10-06 LAB — HEPATITIS C ANTIBODY
Hepatitis C Ab: NONREACTIVE
SIGNAL TO CUT-OFF: 0.01 (ref <1.00)

## 2017-10-12 ENCOUNTER — Encounter: Payer: Self-pay | Admitting: Family Medicine

## 2017-10-12 ENCOUNTER — Ambulatory Visit (INDEPENDENT_AMBULATORY_CARE_PROVIDER_SITE_OTHER): Payer: 59 | Admitting: Family Medicine

## 2017-10-12 VITALS — BP 120/84 | HR 52 | Temp 98.7°F | Ht 62.25 in | Wt 177.0 lb

## 2017-10-12 DIAGNOSIS — E538 Deficiency of other specified B group vitamins: Secondary | ICD-10-CM

## 2017-10-12 DIAGNOSIS — Z0000 Encounter for general adult medical examination without abnormal findings: Secondary | ICD-10-CM

## 2017-10-12 DIAGNOSIS — G251 Drug-induced tremor: Secondary | ICD-10-CM

## 2017-10-12 DIAGNOSIS — E0590 Thyrotoxicosis, unspecified without thyrotoxic crisis or storm: Secondary | ICD-10-CM | POA: Diagnosis not present

## 2017-10-12 DIAGNOSIS — R7301 Impaired fasting glucose: Secondary | ICD-10-CM | POA: Diagnosis not present

## 2017-10-12 DIAGNOSIS — F3170 Bipolar disorder, currently in remission, most recent episode unspecified: Secondary | ICD-10-CM

## 2017-10-12 DIAGNOSIS — I10 Essential (primary) hypertension: Secondary | ICD-10-CM

## 2017-10-12 DIAGNOSIS — K219 Gastro-esophageal reflux disease without esophagitis: Secondary | ICD-10-CM

## 2017-10-12 MED ORDER — PROPRANOLOL HCL ER 120 MG PO CP24: 120.0000 mg | ORAL_CAPSULE | Freq: Every day | ORAL | 3 refills | Status: DC

## 2017-10-12 MED ORDER — LISINOPRIL 10 MG PO TABS: ORAL_TABLET | ORAL | 3 refills | Status: DC

## 2017-10-12 MED ORDER — PANTOPRAZOLE SODIUM 40 MG PO TBEC: 40.0000 mg | DELAYED_RELEASE_TABLET | Freq: Every day | ORAL | 3 refills | Status: DC

## 2017-10-12 MED ORDER — VITAMIN B-12 500 MCG PO TABS: 1000.0000 ug | ORAL_TABLET | Freq: Every day | ORAL | Status: DC

## 2017-10-12 NOTE — Patient Instructions (Addendum)
Vitamin B12 remains high - decrease to 1042mg every other day if you can.  You are doing well today Watch added sugars in the diet.  Return as needed or in 1 year for next physical. Health Maintenance, Female Adopting a healthy lifestyle and getting preventive care can go a long way to promote health and wellness. Talk with your health care provider about what schedule of regular examinations is right for you. This is a good chance for you to check in with your provider about disease prevention and staying healthy. In between checkups, there are plenty of things you can do on your own. Experts have done a lot of research about which lifestyle changes and preventive measures are most likely to keep you healthy. Ask your health care provider for more information. Weight and diet Eat a healthy diet  Be sure to include plenty of vegetables, fruits, low-fat dairy products, and lean protein.  Do not eat a lot of foods high in solid fats, added sugars, or salt.  Get regular exercise. This is one of the most important things you can do for your health. ? Most adults should exercise for at least 150 minutes each week. The exercise should increase your heart rate and make you sweat (moderate-intensity exercise). ? Most adults should also do strengthening exercises at least twice a week. This is in addition to the moderate-intensity exercise.  Maintain a healthy weight  Body mass index (BMI) is a measurement that can be used to identify possible weight problems. It estimates body fat based on height and weight. Your health care provider can help determine your BMI and help you achieve or maintain a healthy weight.  For females 228years of age and older: ? A BMI below 18.5 is considered underweight. ? A BMI of 18.5 to 24.9 is normal. ? A BMI of 25 to 29.9 is considered overweight. ? A BMI of 30 and above is considered obese.  Watch levels of cholesterol and blood lipids  You should start having  your blood tested for lipids and cholesterol at 56years of age, then have this test every 5 years.  You may need to have your cholesterol levels checked more often if: ? Your lipid or cholesterol levels are high. ? You are older than 56years of age. ? You are at high risk for heart disease.  Cancer screening Lung Cancer  Lung cancer screening is recommended for adults 59875years old who are at high risk for lung cancer because of a history of smoking.  A yearly low-dose CT scan of the lungs is recommended for people who: ? Currently smoke. ? Have quit within the past 15 years. ? Have at least a 30-pack-year history of smoking. A pack year is smoking an average of one pack of cigarettes a day for 1 year.  Yearly screening should continue until it has been 15 years since you quit.  Yearly screening should stop if you develop a health problem that would prevent you from having lung cancer treatment.  Breast Cancer  Practice breast self-awareness. This means understanding how your breasts normally appear and feel.  It also means doing regular breast self-exams. Let your health care provider know about any changes, no matter how small.  If you are in your 20s or 30s, you should have a clinical breast exam (CBE) by a health care provider every 1-3 years as part of a regular health exam.  If you are 420or older, have a CBE  every year. Also consider having a breast X-ray (mammogram) every year.  If you have a family history of breast cancer, talk to your health care provider about genetic screening.  If you are at high risk for breast cancer, talk to your health care provider about having an MRI and a mammogram every year.  Breast cancer gene (BRCA) assessment is recommended for women who have family members with BRCA-related cancers. BRCA-related cancers include: ? Breast. ? Ovarian. ? Tubal. ? Peritoneal cancers.  Results of the assessment will determine the need for genetic  counseling and BRCA1 and BRCA2 testing.  Cervical Cancer Your health care provider may recommend that you be screened regularly for cancer of the pelvic organs (ovaries, uterus, and vagina). This screening involves a pelvic examination, including checking for microscopic changes to the surface of your cervix (Pap test). You may be encouraged to have this screening done every 3 years, beginning at age 21.  For women ages 30-65, health care providers may recommend pelvic exams and Pap testing every 3 years, or they may recommend the Pap and pelvic exam, combined with testing for human papilloma virus (HPV), every 5 years. Some types of HPV increase your risk of cervical cancer. Testing for HPV may also be done on women of any age with unclear Pap test results.  Other health care providers may not recommend any screening for nonpregnant women who are considered low risk for pelvic cancer and who do not have symptoms. Ask your health care provider if a screening pelvic exam is right for you.  If you have had past treatment for cervical cancer or a condition that could lead to cancer, you need Pap tests and screening for cancer for at least 20 years after your treatment. If Pap tests have been discontinued, your risk factors (such as having a new sexual partner) need to be reassessed to determine if screening should resume. Some women have medical problems that increase the chance of getting cervical cancer. In these cases, your health care provider may recommend more frequent screening and Pap tests.  Colorectal Cancer  This type of cancer can be detected and often prevented.  Routine colorectal cancer screening usually begins at 56 years of age and continues through 56 years of age.  Your health care provider may recommend screening at an earlier age if you have risk factors for colon cancer.  Your health care provider may also recommend using home test kits to check for hidden blood in the  stool.  A small camera at the end of a tube can be used to examine your colon directly (sigmoidoscopy or colonoscopy). This is done to check for the earliest forms of colorectal cancer.  Routine screening usually begins at age 50.  Direct examination of the colon should be repeated every 5-10 years through 56 years of age. However, you may need to be screened more often if early forms of precancerous polyps or small growths are found.  Skin Cancer  Check your skin from head to toe regularly.  Tell your health care provider about any new moles or changes in moles, especially if there is a change in a mole's shape or color.  Also tell your health care provider if you have a mole that is larger than the size of a pencil eraser.  Always use sunscreen. Apply sunscreen liberally and repeatedly throughout the day.  Protect yourself by wearing long sleeves, pants, a wide-brimmed hat, and sunglasses whenever you are outside.  Heart disease, diabetes,   and high blood pressure  High blood pressure causes heart disease and increases the risk of stroke. High blood pressure is more likely to develop in: ? People who have blood pressure in the high end of the normal range (130-139/85-89 mm Hg). ? People who are overweight or obese. ? People who are African American.  If you are 18-39 years of age, have your blood pressure checked every 3-5 years. If you are 40 years of age or older, have your blood pressure checked every year. You should have your blood pressure measured twice-once when you are at a hospital or clinic, and once when you are not at a hospital or clinic. Record the average of the two measurements. To check your blood pressure when you are not at a hospital or clinic, you can use: ? An automated blood pressure machine at a pharmacy. ? A home blood pressure monitor.  If you are between 55 years and 79 years old, ask your health care provider if you should take aspirin to prevent  strokes.  Have regular diabetes screenings. This involves taking a blood sample to check your fasting blood sugar level. ? If you are at a normal weight and have a low risk for diabetes, have this test once every three years after 56 years of age. ? If you are overweight and have a high risk for diabetes, consider being tested at a younger age or more often. Preventing infection Hepatitis B  If you have a higher risk for hepatitis B, you should be screened for this virus. You are considered at high risk for hepatitis B if: ? You were born in a country where hepatitis B is common. Ask your health care provider which countries are considered high risk. ? Your parents were born in a high-risk country, and you have not been immunized against hepatitis B (hepatitis B vaccine). ? You have HIV or AIDS. ? You use needles to inject street drugs. ? You live with someone who has hepatitis B. ? You have had sex with someone who has hepatitis B. ? You get hemodialysis treatment. ? You take certain medicines for conditions, including cancer, organ transplantation, and autoimmune conditions.  Hepatitis C  Blood testing is recommended for: ? Everyone born from 1945 through 1965. ? Anyone with known risk factors for hepatitis C.  Sexually transmitted infections (STIs)  You should be screened for sexually transmitted infections (STIs) including gonorrhea and chlamydia if: ? You are sexually active and are younger than 56 years of age. ? You are older than 56 years of age and your health care provider tells you that you are at risk for this type of infection. ? Your sexual activity has changed since you were last screened and you are at an increased risk for chlamydia or gonorrhea. Ask your health care provider if you are at risk.  If you do not have HIV, but are at risk, it may be recommended that you take a prescription medicine daily to prevent HIV infection. This is called pre-exposure prophylaxis  (PrEP). You are considered at risk if: ? You are sexually active and do not regularly use condoms or know the HIV status of your partner(s). ? You take drugs by injection. ? You are sexually active with a partner who has HIV.  Talk with your health care provider about whether you are at high risk of being infected with HIV. If you choose to begin PrEP, you should first be tested for HIV. You should then   be tested every 3 months for as long as you are taking PrEP. Pregnancy  If you are premenopausal and you may become pregnant, ask your health care provider about preconception counseling.  If you may become pregnant, take 400 to 800 micrograms (mcg) of folic acid every day.  If you want to prevent pregnancy, talk to your health care provider about birth control (contraception). Osteoporosis and menopause  Osteoporosis is a disease in which the bones lose minerals and strength with aging. This can result in serious bone fractures. Your risk for osteoporosis can be identified using a bone density scan.  If you are 51 years of age or older, or if you are at risk for osteoporosis and fractures, ask your health care provider if you should be screened.  Ask your health care provider whether you should take a calcium or vitamin D supplement to lower your risk for osteoporosis.  Menopause may have certain physical symptoms and risks.  Hormone replacement therapy may reduce some of these symptoms and risks. Talk to your health care provider about whether hormone replacement therapy is right for you. Follow these instructions at home:  Schedule regular health, dental, and eye exams.  Stay current with your immunizations.  Do not use any tobacco products including cigarettes, chewing tobacco, or electronic cigarettes.  If you are pregnant, do not drink alcohol.  If you are breastfeeding, limit how much and how often you drink alcohol.  Limit alcohol intake to no more than 1 drink per day for  nonpregnant women. One drink equals 12 ounces of beer, 5 ounces of wine, or 1 ounces of hard liquor.  Do not use street drugs.  Do not share needles.  Ask your health care provider for help if you need support or information about quitting drugs.  Tell your health care provider if you often feel depressed.  Tell your health care provider if you have ever been abused or do not feel safe at home. This information is not intended to replace advice given to you by your health care provider. Make sure you discuss any questions you have with your health care provider. Document Released: 07/12/2010 Document Revised: 06/04/2015 Document Reviewed: 09/30/2014 Elsevier Interactive Patient Education  [employer].

## 2017-10-12 NOTE — Assessment & Plan Note (Signed)
Continued, stable. Encouraged avoiding added sugars in diet.

## 2017-10-12 NOTE — Assessment & Plan Note (Addendum)
Continue protonix daily.  dexilant was unaffordable.

## 2017-10-12 NOTE — Assessment & Plan Note (Signed)
Ongoing, severe. Propranolol increased to 120mg  and pt tolerating mild bradycardia well - continue this.

## 2017-10-12 NOTE — Assessment & Plan Note (Signed)
Levels remain high - advised to decrease dose to 1073mcg daily or QOD

## 2017-10-12 NOTE — Progress Notes (Signed)
BP 120/84 (BP Location: Left Arm, Patient Position: Sitting, Cuff Size: Normal)   Pulse (!) 52   Temp 98.7 F (37.1 C) (Oral)   Ht 5' 2.25" (1.581 m)   Wt 177 lb (80.3 kg)   SpO2 98%   BMI 32.11 kg/m    CC: CPE Subjective:    Patient ID: Katelyn Price, female    DOB: January 27, 1960, 57 y.o.   MRN: [id number]  HPI: Katelyn Price is a 57 y.o. female presenting on 10/12/2017 for Annual Exam   Husband helps her manage medications  Preventative: COLONOSCOPY6/2016diverticulosis, rpt 10 yrs Collene Mares) Well woman with OBGYN 03/2017 (Dr. Nori Riis), normal mammogram and pap smear per patient this year. Was on HRT - she stopped this this year.  LMP 07/2017 - becoming more infrequent Flu shot yearly Tdap - 06/2011  Seat belt use discussed  Sunscreen use discussed, no changing moles on skin Ex smoker - quit 2007. Husband smokes pipe and e-cig. Alcohol - none  Dentist Q6 mo Eye exam - has not seen recently  Caffeine: drinks diet drinks  Lives with husband and 1 dog, 1 cat  Occupation: housewife  Activity: no regular exercise  Diet: good water, fruits/vegetables some   Relevant past medical, surgical, family and social history reviewed and updated as indicated. Interim medical history since our last visit reviewed. Allergies and medications reviewed and updated. Outpatient Medications Prior to Visit  Medication Sig Dispense Refill  . buPROPion (WELLBUTRIN XL) 150 MG 24 hr tablet Take 150 mg by mouth daily.     . busPIRone (BUSPAR) 15 MG tablet Take 7.5 mg by mouth 3 (three) times daily.     . Ca & Phos-Vit D-Mag (CALCIUM) [phone] TABS Take by mouth.    . Cholecalciferol (VITAMIN D) 2000 UNITS CAPS Take 1 capsule by mouth daily.     . citalopram (CELEXA) 10 MG tablet Take 20 mg by mouth daily.     . clindamycin (CLEOCIN T) 1 % lotion Apply 1 application topically daily.    Marland Kitchen estradiol (ESTRACE) 2 MG tablet Take 2 mg by mouth daily.    Marland Kitchen lithium carbonate (LITHOBID) 300 MG CR  tablet Take 300 mg by mouth 2 (two) times daily.    . methimazole (TAPAZOLE) 5 MG tablet TAKE 1 TABLET BY MOUTH IN THE MORNING AND 1 TABLET IN THE EVENING 180 tablet 0  . polyethylene glycol (MIRALAX / GLYCOLAX) packet Take 17 g by mouth daily.    . progesterone (PROMETRIUM) 200 MG capsule Take 1 capsule (200 mg total) by mouth at bedtime.    . psyllium (METAMUCIL) 58.6 % packet Take 1 packet by mouth daily.    . ziprasidone (GEODON) 80 MG capsule Take 240 mg by mouth at bedtime.     Marland Kitchen guanFACINE (TENEX) 1 MG tablet Take 1 mg by mouth 2 (two) times daily.    Marland Kitchen lisinopril (PRINIVIL,ZESTRIL) 10 MG tablet TAKE 1 TABLET (10 MG TOTAL) BY MOUTH DAILY. 90 tablet 3  . pantoprazole (PROTONIX) 40 MG tablet TAKE 1 TABLET (40 MG TOTAL) BY MOUTH DAILY. 90 tablet 3  . progesterone (PROMETRIUM) 100 MG capsule Take 100 mg by mouth at bedtime.  12  . propranolol ER (INDERAL LA) 120 MG 24 hr capsule Take 1 capsule (120 mg total) by mouth daily. 30 capsule 3  . vitamin B-12 (CYANOCOBALAMIN) 500 MCG tablet Take 2,000 mcg by mouth daily.     Marland Kitchen rOPINIRole (REQUIP) 1 MG tablet Take 1 mg by  mouth 3 (three) times daily.     No facility-administered medications prior to visit.      Per HPI unless specifically indicated in ROS section below Review of Systems  Constitutional: Negative for activity change, appetite change, chills, fatigue, fever and unexpected weight change.  HENT: Negative for hearing loss.   Eyes: Negative for visual disturbance.  Respiratory: Positive for shortness of breath. Negative for cough, chest tightness and wheezing.   Cardiovascular: Negative for chest pain, palpitations and leg swelling.  Gastrointestinal: Positive for abdominal pain, blood in stool (once - denies hemorrhoids) and nausea. Negative for abdominal distention, constipation, diarrhea and vomiting.  Genitourinary: Negative for difficulty urinating and hematuria.  Musculoskeletal: Negative for arthralgias, myalgias and neck pain.   Skin: Negative for rash.  Neurological: Positive for tremors (marked, chronic) and headaches. Negative for dizziness, seizures and syncope.  Hematological: Negative for adenopathy. Does not bruise/bleed easily.  Psychiatric/Behavioral: Positive for dysphoric mood. The patient is not nervous/anxious.       Objective:    BP 120/84 (BP Location: Left Arm, Patient Position: Sitting, Cuff Size: Normal)   Pulse (!) 52   Temp 98.7 F (37.1 C) (Oral)   Ht 5' 2.25" (1.581 m)   Wt 177 lb (80.3 kg)   SpO2 98%   BMI 32.11 kg/m   Wt Readings from Last 3 Encounters:  10/12/17 177 lb (80.3 kg)  05/17/17 177 lb 6.4 oz (80.5 kg)  01/06/17 191 lb 8 oz (86.9 kg)    Physical Exam  Constitutional: She is oriented to person, place, and time. She appears well-developed and well-nourished. No distress.  HENT:  Head: Normocephalic and atraumatic.  Right Ear: Hearing, tympanic membrane, external ear and ear canal normal.  Left Ear: Hearing, tympanic membrane, external ear and ear canal normal.  Nose: Nose normal.  Mouth/Throat: Uvula is midline, oropharynx is clear and moist and mucous membranes are normal. No oropharyngeal exudate, posterior oropharyngeal edema or posterior oropharyngeal erythema.  Eyes: Pupils are equal, round, and reactive to light. Conjunctivae and EOM are normal. No scleral icterus.  Neck: Normal range of motion. Neck supple.  Cardiovascular: Normal rate, regular rhythm, normal heart sounds and intact distal pulses.  No murmur heard. Pulses:      Radial pulses are 2+ on the right side, and 2+ on the left side.  Pulmonary/Chest: Effort normal and breath sounds normal. No respiratory distress. She has no wheezes. She has no rales.  Abdominal: Soft. Bowel sounds are normal. She exhibits no distension and no mass. There is no tenderness. There is no rebound and no guarding.  Musculoskeletal: Normal range of motion. She exhibits no edema.  Lymphadenopathy:    She has no cervical  adenopathy.  Neurological: She is alert and oriented to person, place, and time.  CN grossly intact, station and gait intact Postural action tremor present  Skin: Skin is warm and dry. No rash noted.  Psychiatric: She has a normal mood and affect. Her behavior is normal. Judgment and thought content normal.  Nursing note and vitals reviewed.  Results for orders placed or performed in visit on 10/12/17  HM MAMMOGRAPHY  Result Value Ref Range   HM Mammogram Self Reported Normal 0-4 Bi-Rad, Self Reported Normal  HM PAP SMEAR  Result Value Ref Range   HM Pap smear per pt normal       Assessment & Plan:   Problem List Items Addressed This Visit    Tremor due to multiple drugs    Ongoing, severe.  Propranolol increased to 120mg  and pt tolerating mild bradycardia well - continue this.       Impaired fasting glucose    Continued, stable. Encouraged avoiding added sugars in diet.       Hyperthyroidism    On methimazole. Appreciate endo care.       Relevant Medications   propranolol ER (INDERAL LA) 120 MG 24 hr capsule   HYPERTENSION, BENIGN ESSENTIAL    Chronic, stable. Continue current regimen.       Relevant Medications   lisinopril (PRINIVIL,ZESTRIL) 10 MG tablet   propranolol ER (INDERAL LA) 120 MG 24 hr capsule   Healthcare maintenance - Primary    Preventative protocols reviewed and updated unless pt declined. Discussed healthy diet and lifestyle.       GERD (gastroesophageal reflux disease)    Continue protonix daily.  dexilant was unaffordable.      Relevant Medications   pantoprazole (PROTONIX) 40 MG tablet   Bipolar disorder [facility])    Appreciate psych care - sees Noemi Chapel      B12 deficiency    Levels remain high - advised to decrease dose to 1063mcg daily or QOD          Meds ordered this encounter  Medications  . lisinopril (PRINIVIL,ZESTRIL) 10 MG tablet    Sig: TAKE 1 TABLET (10 MG TOTAL) BY MOUTH DAILY.    Dispense:  90 tablet    Refill:  3    . propranolol ER (INDERAL LA) 120 MG 24 hr capsule    Sig: Take 1 capsule (120 mg total) by mouth daily.    Dispense:  90 capsule    Refill:  3  . vitamin B-12 (CYANOCOBALAMIN) 500 MCG tablet    Sig: Take 2 tablets (1,000 mcg total) by mouth daily.  . pantoprazole (PROTONIX) 40 MG tablet    Sig: Take 1 tablet (40 mg total) by mouth daily.    Dispense:  90 tablet    Refill:  3   Orders Placed This Encounter  Procedures  . HM MAMMOGRAPHY    This external order was created through the Results Console.  Marland Kitchen HM PAP SMEAR    This external order was created through the Results Console.    Follow up plan: Return in about 1 year (around 10/13/2018) for annual exam, prior fasting for blood work.  Ria Bush, MD

## 2017-10-12 NOTE — Assessment & Plan Note (Signed)
Appreciate psych care - sees Noemi Chapel

## 2017-10-12 NOTE — Assessment & Plan Note (Signed)
Preventative protocols reviewed and updated unless pt declined. Discussed healthy diet and lifestyle.  

## 2017-10-12 NOTE — Assessment & Plan Note (Signed)
On methimazole. Appreciate endo care.

## 2017-10-12 NOTE — Assessment & Plan Note (Signed)
Chronic, stable. Continue current regimen. 

## 2017-11-20 ENCOUNTER — Other Ambulatory Visit: Payer: Self-pay | Admitting: Internal Medicine

## 2017-11-20 ENCOUNTER — Encounter: Payer: Self-pay | Admitting: Internal Medicine

## 2017-11-20 ENCOUNTER — Ambulatory Visit (INDEPENDENT_AMBULATORY_CARE_PROVIDER_SITE_OTHER): Payer: 59 | Admitting: Internal Medicine

## 2017-11-20 VITALS — BP 118/70 | HR 51 | Ht 62.25 in | Wt 179.0 lb

## 2017-11-20 DIAGNOSIS — E0500 Thyrotoxicosis with diffuse goiter without thyrotoxic crisis or storm: Secondary | ICD-10-CM | POA: Diagnosis not present

## 2017-11-20 DIAGNOSIS — E0590 Thyrotoxicosis, unspecified without thyrotoxic crisis or storm: Secondary | ICD-10-CM | POA: Diagnosis not present

## 2017-11-20 DIAGNOSIS — E042 Nontoxic multinodular goiter: Secondary | ICD-10-CM | POA: Diagnosis not present

## 2017-11-20 LAB — TSH: TSH: 6.31 u[IU]/mL — AB (ref 0.35–4.50)

## 2017-11-20 LAB — T3, FREE: T3 FREE: 3.2 pg/mL (ref 2.3–4.2)

## 2017-11-20 LAB — T4, FREE: FREE T4: 0.42 ng/dL — AB (ref 0.60–1.60)

## 2017-11-20 MED ORDER — METHIMAZOLE 5 MG PO TABS: ORAL_TABLET | ORAL | 5 refills | Status: DC

## 2017-11-20 NOTE — Patient Instructions (Signed)
Please continue Methimazole 5 mg 2x a day with meals.  Please stop at the lab.  Please come back for a follow-up appointment in 6 months.

## 2017-11-20 NOTE — Progress Notes (Signed)
Patient ID: Katelyn Price, female   DOB: 1960-04-06, 57 y.o.   MRN: [id number]    HPI  Katelyn Price is a 57 y.o.-year-old female, returning for f/u for Graves ds. and thyroid nodules. Last visit 6 months ago.  Patient was diagnosed with thyrotoxicosis in summer 2017 and was confirmed this Graves' disease on an uptake and scan from 10/2015.  I reviewed patient's TFTs: Lab Results  Component Value Date   TSH 3.27 08/23/2017   TSH 1.32 05/31/2017   TSH 0.20 (L) 04/25/2017   TSH 0.02 (L) 03/17/2017   TSH <0.01 (L) 02/09/2017   TSH <0.01 (L) 12/28/2016   TSH 0.01 (L) 11/14/2016   TSH 0.01 (L) 09/05/2016   TSH 0.02 (L) 07/25/2016   TSH 3.07 06/15/2016   FREET4 0.41 (L) 08/23/2017   FREET4 0.51 (L) 05/31/2017   FREET4 0.53 (L) 04/25/2017   FREET4 0.64 03/17/2017   FREET4 0.84 02/09/2017   FREET4 0.93 12/28/2016   FREET4 0.90 11/14/2016   FREET4 0.90 09/05/2016   FREET4 1.03 07/25/2016   FREET4 0.65 06/15/2016   She had elevated thyrotropin receptor antibodies and TSI's.  At last visit, TSI's were slightly higher. Component     Latest Ref Rng & Units 08/25/2015  Thyrotropin Receptor Ab     <=16.0 % 41.4 (H)   Lab Results  Component Value Date   TSI 499 (H) 05/31/2017   TSI 442 (H) 11/14/2016   TSI 630 (H) 10/14/2015   Reviewed the reports and images of her imaging tests: Thyroid ultrasound on 09/04/2015: Right thyroid lobe: 4.1 cm x 1.7 cm x 1.9 cm. Relatively homogeneous appearance of right thyroid tissue.  Right thyroid nodule #1 measures 0.7 cm x 0.7 cm x 0.7 cm mixed cystic and solid composition (1), with hypoechoic echogenicity (2),  taller than wide shape (3) Left thyroid lobe: 5.1 cm x 1.9 cm x 2.4 cm. Relatively homogeneous appearance of the left thyroid.  *Inferior left nodule #1 measures 1.5 cm x 1.1 cm x 1.3 cm Nearly completely solid composition (2), with isoechoic  echogenicity (1).   There are 3 additional separate nodules all measuring less than 5 mm  with characteristics compatible with colloid cysts. Isthmus Thickness: 0.4 cm.  No nodules visualized. Lymphadenopathy: None visualized.  Thyroid Uptake and scan (10/22/2015) >> Graves' disease.: 24 hour radioactive iodine uptake 36.9%.Diffuse radiotracer uptake identified within both lobes of the thyroid gland.  No dominant hot or cold nodule.  She was initially started on methimazole 10 mg twice a day, then decreased to 5 mg twice a day in 02/2016 after which TFTs normalized.  We were able to decrease the dose further to 5 mg daily.  We have tried an even lower dose but her TSH became suppressed on less than 5 mg methimazole daily.  As of now she is on 5 mg twice a day of methimazole, does increase 01/2017.  She is on propranolol 80 mg at bedtime for tremors.  She is on lithium for bipolar disorder.  Pt denies: - feeling nodules in neck - hoarseness - dysphagia - choking - SOB with lying down  Pt does have a FH of thyroid ds on father's side of the family. No FH of thyroid cancer. No h/o radiation tx to head or neck.  No herbal supplements. On laxative and Metamucil. No Biotin use. No recent steroids use.   Pt. also has a history of GERD, psoriasis, bipolar dis.,  HTN, B12 def.  ROS: Constitutional:  no weight gain/no weight loss, no fatigue, no subjective hyperthermia, no subjective hypothermia Eyes: no blurry vision, no xerophthalmia ENT: no sore throat, + see HPI Cardiovascular: no CP/no SOB/no palpitations/+ leg swelling Respiratory: no cough/no SOB/no wheezing Gastrointestinal: + N/+ V/no D/no C/+ acid reflux Musculoskeletal: no muscle aches/no joint aches Skin: no rashes, + hair loss Neurological: ++ Tremors/no numbness/no tingling/no dizziness, + HA  + Occasional spotting. Seeing ObGyn - Dr. Milta Deiters. On HRT.  I reviewed pt's medications, allergies, PMH, social hx, family hx, and changes were documented in the history of present illness. Otherwise, unchanged from my initial  visit note.  Past Medical History:  Diagnosis Date  . Abnormal involuntary movements(781.0)   . Acne   . Allergic rhinitis   . Bipolar disorder, unspecified ([place])    psych - Dr. Lattie Haw  . Diaphragmatic hernia without mention of obstruction or gangrene   . GERD (gastroesophageal reflux disease)    03/18/02, EGD, [facility] Gerd,gastritis, dilated  . History of MRI of brain and brain stem 06/01/03   wnl  . Hypertension   . Other psoriasis   . Unspecified sinusitis (chronic)    Past Surgical History:  Procedure Laterality Date  . COLONOSCOPY  03/18/02   internal hemorrhoids  . COLONOSCOPY  06/2014   diverticulosis, rpt 10 yrs Collene Mares)  . ESOPHAGOGASTRODUODENOSCOPY  1/99   [facility]ar[facility] Iles; HH,GERD  . ESOPHAGOGASTRODUODENOSCOPY  03/18/02   HH,GERD,Gastritis; Dilated  . LAPAROSCOPIC ESOPHAGOGASTRIC FUNDOPLASTY  08/28/02   [facility]ell Done   Social History   Social History  . Marital status: Married    Spouse name: N/A  . Number of children: 0   Occupational History  . Housewife    Social History Main Topics  . Smoking status: Former Smoker    Packs/day: 1.00    Years: 20.00    Types: Cigarettes    Quit date: 01/10/1998  . Smokeless tobacco: Never Used  . Alcohol use No  . Drug use: No   Social History Narrative   Caffeine: drinks diet drinks   Lives with husband and 1 dog, 1 cat   Occupation: housewife   Activity: no regular exercise, doesn't feel safe to walk outside at home, could walk on mother's treadmill   Diet: good water, fruits/vegetables some    Current Outpatient Medications on File Prior to Visit  Medication Sig Dispense Refill  . buPROPion (WELLBUTRIN XL) 150 MG 24 hr tablet Take 150 mg by mouth daily.     . busPIRone (BUSPAR) 15 MG tablet Take 7.5 mg by mouth 3 (three) times daily.     . Ca & Phos-Vit D-Mag (CALCIUM) [phone] TABS Take by mouth.    . Cholecalciferol (VITAMIN D) 2000 UNITS CAPS Take 1 capsule by mouth daily.     . citalopram (CELEXA) 10 MG tablet Take 20 mg  by mouth daily.     . clindamycin (CLEOCIN T) 1 % lotion Apply 1 application topically daily.    Marland Kitchen estradiol (ESTRACE) 2 MG tablet Take 2 mg by mouth daily.    Marland Kitchen lisinopril (PRINIVIL,ZESTRIL) 10 MG tablet TAKE 1 TABLET (10 MG TOTAL) BY MOUTH DAILY. 90 tablet 3  . lithium carbonate (LITHOBID) 300 MG CR tablet Take 300 mg by mouth 2 (two) times daily.    . methimazole (TAPAZOLE) 5 MG tablet TAKE 1 TABLET BY MOUTH IN THE MORNING AND 1 TABLET IN THE EVENING 180 tablet 0  . pantoprazole (PROTONIX) 40 MG tablet Take 1 tablet (40 mg total) by mouth daily. 90 tablet 3  .  polyethylene glycol (MIRALAX / GLYCOLAX) packet Take 17 g by mouth daily.    . progesterone (PROMETRIUM) 200 MG capsule Take 1 capsule (200 mg total) by mouth at bedtime.    . propranolol ER (INDERAL LA) 120 MG 24 hr capsule Take 1 capsule (120 mg total) by mouth daily. 90 capsule 3  . psyllium (METAMUCIL) 58.6 % packet Take 1 packet by mouth daily.    . vitamin B-12 (CYANOCOBALAMIN) 500 MCG tablet Take 2 tablets (1,000 mcg total) by mouth daily.    . ziprasidone (GEODON) 80 MG capsule Take 240 mg by mouth at bedtime.      No current facility-administered medications on file prior to visit.    Allergies  Allergen Reactions  . Hydrocodone-Homatropine     REACTION: unspecified  . Minocycline Hcl     REACTION: Itching   Family History  Problem Relation Age of Onset  . Arthritis Mother        fibromyalgia  . Alcohol abuse Father   . Emphysema Brother        smoker  . COPD Brother        smoker, continues.  . Arthritis Sister        fibromyalgia  . Cancer Neg Hx   . Coronary artery disease Neg Hx   . Stroke Neg Hx   . Diabetes Neg Hx    PE: BP 118/70   Pulse (!) 51   Ht 5' 2.25" (1.581 m)   Wt 179 lb (81.2 kg)   SpO2 99%   BMI 32.48 kg/m  Wt Readings from Last 3 Encounters:  11/20/17 179 lb (81.2 kg)  10/12/17 177 lb (80.3 kg)  05/17/17 177 lb 6.4 oz (80.5 kg)   Constitutional: overweight, in NAD Eyes: PERRLA,  EOMI, no exophthalmos ENT: moist mucous membranes, no thyromegaly, no cervical lymphadenopathy Cardiovascular: RRR, No MRG Respiratory: CTA B Gastrointestinal: abdomen soft, NT, ND, BS+ Musculoskeletal: no deformities, strength intact in all 4 Skin: moist, warm, no rashes Neurological: + tremor with outstretched hands, DTR normal in all 4   ASSESSMENT: 1.  Graves' disease  2. Thyroid nodules  PLAN:  1. Patient with thyrotoxicosis diagnosed in summer 2017, when she presented with thyrotoxic symptoms: Tremors (however, these are chronic for her due to lithium), heat intolerance, palpitations, shortness of breath.  Symptoms improved after starting methimazole and we were able to decrease the dose, currently at 5 mg twice a day (dose increase 01/2017).  Of note, she could not tolerate lower doses then 5 mg in the past as her TSH became suppressed again when we try to decrease the dose. -She continues to have heat intolerance and tremors, but otherwise is asymptomatic -We again discussed about different modalities of treatment for Graves' disease, to include continuing methimazole, RAI ablation, or, last resort, surgery.  Methimazole does appear to be effective for her so we will continue with this now and will try to decrease the dose to 5 mg daily as soon as possible.  At last visit, her TSI's were still high, in fact higher than before, so I suspect that she will need methimazole for a longer period of time. -We will check her TFTs today.  Reviewed the ones checked 3 months ago and these were normal. - She is on a beta-blocker for her tremors, which should also help with tachycardia that can occur in the setting of hyperthyroidism.  Her pulse is low today. - RTC in 6 months  2. Thyroid nodules -Patient has a  1.5 cm iso-/or slightly hypoechoic thyroid nodule, with internal blood flow on her thyroid ultrasound from 08/2015.  The nodule appeared warm on the uptake and scan, which can either  indicate an inflammatory nodule (pseudo-nodule) or a normal functioning one.  Since the nodule was seen in context of thyrotoxicosis, we decided to wait until she is euthyroid and repeat a thyroid ultrasound then - will order this at next visit. -Of note, she has no neck compression symptoms.  Orders Placed This Encounter  Procedures  . TSH  . T4, free  . T3, free   Needs refills.  Office Visit on 11/20/2017  Component Date Value Ref Range Status  . TSH 11/20/2017 6.31* 0.35 - 4.50 uIU/mL Final  . Free T4 11/20/2017 0.42* 0.60 - 1.60 ng/dL Final   Comment: Specimens from patients who are undergoing biotin therapy and /or ingesting biotin supplements may contain high levels of biotin.  The higher biotin concentration in these specimens interferes with this Free T4 assay.  Specimens that contain high levels  of biotin may cause false high results for this Free T4 assay.  Please interpret results in light of the total clinical presentation of the patient.    . T3, Free 11/20/2017 3.2  2.3 - 4.2 pg/mL Final   Will decrease the methimazole to 5 mg daily and have her back for labs in 5 weeks.   Philemon Kingdom, MD PhD [facility]

## 2017-11-27 ENCOUNTER — Other Ambulatory Visit: Payer: Self-pay | Admitting: Podiatry

## 2017-11-27 ENCOUNTER — Ambulatory Visit (INDEPENDENT_AMBULATORY_CARE_PROVIDER_SITE_OTHER): Payer: 59 | Admitting: Podiatry

## 2017-11-27 ENCOUNTER — Ambulatory Visit (INDEPENDENT_AMBULATORY_CARE_PROVIDER_SITE_OTHER): Payer: 59

## 2017-11-27 DIAGNOSIS — M79671 Pain in right foot: Secondary | ICD-10-CM

## 2017-11-27 DIAGNOSIS — M779 Enthesopathy, unspecified: Secondary | ICD-10-CM | POA: Diagnosis not present

## 2017-11-27 DIAGNOSIS — L600 Ingrowing nail: Secondary | ICD-10-CM

## 2017-11-27 MED ORDER — NEOMYCIN-POLYMYXIN-HC 3.5-10000-1 OT SOLN: OTIC | 1 refills | Status: DC

## 2017-11-27 MED ORDER — NEOMYCIN-POLYMYXIN-HC 3.5-10000-1 OT SOLN: OTIC | 0 refills | Status: DC

## 2017-11-27 NOTE — Patient Instructions (Signed)

## 2017-11-28 NOTE — Progress Notes (Signed)
Subjective:   Patient ID: Katelyn Price, female   DOB: 57 y.o.   MRN: [id number]   HPI Patient presents with severe nail disease of the big nails of both feet that is been painful and make it hard to wear shoe gear comfortably.  Also states that the right big toe has been bothering her but she is not sure where and states it makes it hard to be comfortable.  Patient states these nails are increasingly difficult for her to handle herself and she would like them removed if possible.  Patient does not smoke and likes to be active   Review of Systems  All other systems reviewed and are negative.       Objective:  Physical Exam  Constitutional: She appears well-developed and well-nourished.  Cardiovascular: Intact distal pulses.  Pulmonary/Chest: Effort normal.  Musculoskeletal: Normal range of motion.  Neurological: She is alert.  Skin: Skin is warm.  Nursing note and vitals reviewed.   Neurovascular status intact muscle strength is adequate range of motion within normal limits with patient noted to have significant thickness with dystrophic changes of the hallux nails both feet that are painful with mild discomfort in the right hallux but localized and no indications of lesion formation or other pathology.  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  At this point I did explain to her recovery and she is willing to accept this and I infiltrated each hallux 60 mg like Marcaine mixture sterile prep applied to each big toe and using sterile instrumentation I remove the big toenail exposed matrix and applied phenol 5 applications 30 seconds followed by alcohol lavage and sterile dressing.  Gave instructions on soaks and will be  seen back to reevaluate 2 weeks and will leave dressings o 24 hours and will take them off earlier if any throbbing were to occur.  Patient was given prescription for Corticosporin otic solution and is encouraged to call with any questions she has concerns prior to seeing [place] back again.  Do not recommend treatment for any other pain but may be necessary in future  X-ray was negative for signs of arthritis or pathology associated with the bone of the right big toe

## 2017-12-13 ENCOUNTER — Ambulatory Visit (INDEPENDENT_AMBULATORY_CARE_PROVIDER_SITE_OTHER): Payer: 59 | Admitting: Podiatry

## 2017-12-13 ENCOUNTER — Encounter: Payer: Self-pay | Admitting: Podiatry

## 2017-12-13 DIAGNOSIS — M779 Enthesopathy, unspecified: Secondary | ICD-10-CM | POA: Diagnosis not present

## 2017-12-13 NOTE — Progress Notes (Signed)
Subjective:   Patient ID: Katelyn Price, female   DOB: 57 y.o.   MRN: [id number]   HPI Patient states my nails are healing well but I do get pain in this right forefoot that makes it hard for me to be active   ROS      Objective:  Physical Exam  Neurovascular status intact with nail sites bilateral crusted over healing well with inflammation around the first MPJ second MPJ right that is moderate in intensity     Assessment:  Probability for low-grade capsulitis with well-healing surgical site of the nails bilateral     Plan:  H&P condition reviewed and recommended padding and cushioning therapy for the feet bilateral try to take stress off the areas.  Patient is given over-the-counter insoles to try to reduce stress and may require long-term orthotics

## 2017-12-26 ENCOUNTER — Other Ambulatory Visit (INDEPENDENT_AMBULATORY_CARE_PROVIDER_SITE_OTHER): Payer: 59

## 2017-12-26 ENCOUNTER — Other Ambulatory Visit: Payer: Self-pay | Admitting: Internal Medicine

## 2017-12-26 DIAGNOSIS — E0500 Thyrotoxicosis with diffuse goiter without thyrotoxic crisis or storm: Secondary | ICD-10-CM

## 2017-12-26 LAB — T3, FREE: T3 FREE: 3.2 pg/mL (ref 2.3–4.2)

## 2017-12-26 LAB — T4, FREE: FREE T4: 0.42 ng/dL — AB (ref 0.60–1.60)

## 2017-12-26 LAB — TSH: TSH: 1.75 u[IU]/mL (ref 0.35–4.50)

## 2018-03-23 ENCOUNTER — Other Ambulatory Visit: Payer: Self-pay | Admitting: Internal Medicine

## 2018-03-23 ENCOUNTER — Other Ambulatory Visit (INDEPENDENT_AMBULATORY_CARE_PROVIDER_SITE_OTHER): Payer: 59

## 2018-03-23 ENCOUNTER — Other Ambulatory Visit: Payer: Self-pay

## 2018-03-23 DIAGNOSIS — E0500 Thyrotoxicosis with diffuse goiter without thyrotoxic crisis or storm: Secondary | ICD-10-CM

## 2018-03-23 LAB — T4, FREE: Free T4: 0.46 ng/dL — ABNORMAL LOW (ref 0.60–1.60)

## 2018-03-23 LAB — T3, FREE: T3, Free: 3.7 pg/mL (ref 2.3–4.2)

## 2018-03-23 LAB — TSH: TSH: 1.55 u[IU]/mL (ref 0.35–4.50)

## 2018-03-23 MED ORDER — METHIMAZOLE 5 MG PO TABS: ORAL_TABLET | ORAL | 5 refills | Status: DC

## 2018-05-22 ENCOUNTER — Ambulatory Visit (INDEPENDENT_AMBULATORY_CARE_PROVIDER_SITE_OTHER): Payer: 59 | Admitting: Internal Medicine

## 2018-05-22 ENCOUNTER — Other Ambulatory Visit: Payer: Self-pay

## 2018-05-22 ENCOUNTER — Encounter: Payer: Self-pay | Admitting: Internal Medicine

## 2018-05-22 DIAGNOSIS — E042 Nontoxic multinodular goiter: Secondary | ICD-10-CM | POA: Diagnosis not present

## 2018-05-22 DIAGNOSIS — E0500 Thyrotoxicosis with diffuse goiter without thyrotoxic crisis or storm: Secondary | ICD-10-CM | POA: Diagnosis not present

## 2018-05-22 NOTE — Progress Notes (Signed)
Patient ID: Katelyn Price, female   DOB: April 18, 1960, 58 y.o.   MRN: [id number]   Patient location: Home My location: Office  Referring Provider: Ria Bush, MD  I connected with the patient on 05/22/18 at  8:07 AM EDT by telephone and verified that I am speaking with the correct person.   I discussed the limitations of evaluation and management by telephone and the availability of in person appointments. The patient expressed understanding and agreed to proceed.   Details of the encounter are shown below.   HPI  Katelyn Price is a 58 y.o.-year-old female, presenting for f/u for Graves ds. and thyroid nodules. Last visit 6 months ago.  Patient was diagnosed with thyrotoxicosis in summer 2017 and was confirmed as Graves' disease on an uptake and scan from 10/2015.   Reviewed patient's TFTs: Lab Results  Component Value Date   TSH 1.55 03/23/2018   TSH 1.75 12/26/2017   TSH 6.31 (H) 11/20/2017   TSH 3.27 08/23/2017   TSH 1.32 05/31/2017   TSH 0.20 (L) 04/25/2017   TSH 0.02 (L) 03/17/2017   TSH <0.01 (L) 02/09/2017   TSH <0.01 (L) 12/28/2016   TSH 0.01 (L) 11/14/2016   FREET4 0.46 (L) 03/23/2018   FREET4 0.42 (L) 12/26/2017   FREET4 0.42 (L) 11/20/2017   FREET4 0.41 (L) 08/23/2017   FREET4 0.51 (L) 05/31/2017   FREET4 0.53 (L) 04/25/2017   FREET4 0.64 03/17/2017   FREET4 0.84 02/09/2017   FREET4 0.93 12/28/2016   FREET4 0.90 11/14/2016   She had elevated Graves' antibodies in the past: Lab Results  Component Value Date   TSI 499 (H) 05/31/2017   TSI 442 (H) 11/14/2016   TSI 630 (H) 10/14/2015   Component     Latest Ref Rng & Units 08/25/2015  Thyrotropin Receptor Ab     <=16.0 % 41.4 (H)   Reviewed the report and images of her imaging tests and updated the history: Thyroid ultrasound on 09/04/2015: Right thyroid lobe: 4.1 cm x 1.7 cm x 1.9 cm. Relatively homogeneous appearance of right thyroid tissue.  Right thyroid nodule #1 measures 0.7 cm x 0.7 cm x  0.7 cm mixed cystic and solid composition (1), with hypoechoic echogenicity (2),  taller than wide shape (3) Left thyroid lobe: 5.1 cm x 1.9 cm x 2.4 cm. Relatively homogeneous appearance of the left thyroid.  *Inferior left nodule #1 measures 1.5 cm x 1.1 cm x 1.3 cm Nearly completely solid composition (2), with isoechoic  echogenicity (1).   There are 3 additional separate nodules all measuring less than 5 mm with characteristics compatible with colloid cysts. Isthmus Thickness: 0.4 cm.  No nodules visualized. Lymphadenopathy: None visualized.  Thyroid Uptake and scan (10/22/2015) >> Graves' disease.: 24 hour radioactive iodine uptake 36.9%.Diffuse radiotracer uptake identified within both lobes of the thyroid gland.  No dominant  She was initially started on methimazole 10 mg twice a day, then decreased to 5 mg twice a day in 02/2016 after which TFTs normalized.  We were able to decrease the dose further to 5 mg daily.  We have tried an even lower dose but her TSH became suppressed on less than 5 mg methimazole daily.    At last visit she was on 5 mg twice a day of methimazole, dose increased 01/2017.    In 11/2017, we were able to decrease the dose to 5 mg once a day.  In 03/2018, we were able to decrease the dose to 2.5  mg once a day.  She is on propranolol 80 mg in am for tremors.  She is on lithium for bipolar disorder.  Pt denies: - feeling nodules in neck - hoarseness - dysphagia - choking - SOB with lying down  Pt does have a FH of thyroid ds on father's side of the family. No FH of thyroid cancer. No h/o radiation tx to head or neck.  No herbal supplements. No Biotin use. No recent steroids use.   Pt. also has a history of GERD, psoriasis, bipolar dis., HTN, B12 deficiency.   Last B12 level: Lab Results  Component Value Date   VITAMINB12 1,157 (H) 10/05/2017   VITAMINB12 968 (H) 08/22/2016   VITAMINB12 >1500 (H) 08/21/2015   VITAMINB12 >1500 (H) 08/11/2014    VITAMINB12 189 (L) 07/26/2013   VITAMINB12 127 (L) 09/03/2012   + History of vaginal spotting-seeing OB/GYN (Dr. Milta Deiters).  On HRT.  ROS: Constitutional: no weight gain/no weight loss, + fatigue, + subjective hyperthermia - almost resolved, no subjective hypothermia Eyes: no blurry vision, no xerophthalmia ENT: no sore throat, + see HPI Cardiovascular: no CP/no SOB/no palpitations/no leg swelling Respiratory: no cough/no SOB/no wheezing Gastrointestinal: no N/no V/no D/no C/no acid reflux Musculoskeletal: no muscle aches/no joint aches Skin: no rashes, no hair loss Neurological: + tremors/no numbness/no tingling/no dizziness  I reviewed pt's medications, allergies, PMH, social hx, family hx, and changes were documented in the history of present illness. Otherwise, unchanged from my initial visit note.  Past Medical History:  Diagnosis Date   Abnormal involuntary movements(781.0)    Acne    Allergic rhinitis    Bipolar disorder, unspecified ([place])    psych - Dr. Lattie Haw   Diaphragmatic hernia without mention of obstruction or gangrene    GERD (gastroesophageal reflux disease)    03/18/02, EGD, [facility] Gerd,gastritis, dilated   History of MRI of brain and brain stem 06/01/03   [facility]   Hypertension    Other psoriasis    Unspecified sinusitis (chronic)    Past Surgical History:  Procedure Laterality Date   COLONOSCOPY  03/18/02   internal hemorrhoids   COLONOSCOPY  06/2014   diverticulosis, rpt 10 yrs Collene Mares)   ESOPHAGOGASTRODUODENOSCOPY  1/99   [facility] Iles; HH,GERD   ESOPHAGOGASTRODUODENOSCOPY  03/18/02   HH,GERD,Gastritis; Dilated   LAPAROSCOPIC ESOPHAGOGASTRIC FUNDOPLASTY  08/28/02   [facility]ell Done   Social History   Social History   Marital status: Married    Spouse name: N/A   Number of children: 0   Occupational History   Housewife    Social History Main Topics   Smoking status: Former Smoker    Packs/day: 1.00    Years: 20.00    Types: Cigarettes    Quit date:  01/10/1998   Smokeless tobacco: Never Used   Alcohol use No   Drug use: No   Social History Narrative   Caffeine: drinks diet drinks   Lives with husband and 1 dog, 1 cat   Occupation: housewife   Activity: no regular exercise, doesn't feel safe to walk outside at home, could walk on mother's treadmill   Diet: good water, fruits/vegetables some    Current Outpatient Medications on File Prior to Visit  Medication Sig Dispense Refill   amoxicillin (AMOXIL) 875 MG tablet TAKE 3 TABS BY MOUTH ON DAY 1, THEN 2 TABS DAILY UNTIL GONE  0   buPROPion (WELLBUTRIN XL) 150 MG 24 hr tablet Take 150 mg by mouth daily.      busPIRone (BUSPAR) 15 MG  tablet Take 7.5 mg by mouth 3 (three) times daily.      Ca & Phos-Vit D-Mag (CALCIUM) [phone] TABS Take by mouth.     Cholecalciferol (VITAMIN D) 2000 UNITS CAPS Take 1 capsule by mouth daily.      citalopram (CELEXA) 10 MG tablet Take 20 mg by mouth daily.      clindamycin (CLEOCIN T) 1 % lotion Apply 1 application topically daily.     estradiol (ESTRACE) 2 MG tablet Take 2 mg by mouth daily.     lisinopril (PRINIVIL,ZESTRIL) 10 MG tablet TAKE 1 TABLET (10 MG TOTAL) BY MOUTH DAILY. 90 tablet 3   lithium carbonate (LITHOBID) 300 MG CR tablet Take 300 mg by mouth 2 (two) times daily.     methimazole (TAPAZOLE) 5 MG tablet Take 1/2 tablet daily by mouth 60 tablet 5   neomycin-polymyxin-hydrocortisone (CORTISPORIN) OTIC solution Apply 1-2 drops to toe after soaking BID 10 mL 1   neomycin-polymyxin-hydrocortisone (CORTISPORIN) OTIC solution Apply 1-2 drops to toe after soaking twice a day 10 mL 0   pantoprazole (PROTONIX) 40 MG tablet Take 1 tablet (40 mg total) by mouth daily. 90 tablet 3   polyethylene glycol (MIRALAX / GLYCOLAX) packet Take 17 g by mouth daily.     progesterone (PROMETRIUM) 200 MG capsule Take 1 capsule (200 mg total) by mouth at bedtime.     propranolol ER (INDERAL LA) 120 MG 24 hr capsule Take 1 capsule (120 mg  total) by mouth daily. 90 capsule 3   psyllium (METAMUCIL) 58.6 % packet Take 1 packet by mouth daily.     tretinoin (RETIN-A) 0.05 % cream APPLY 1 APPLICATION ON THE SKIN NIGHTLY FOR ACNE  3   vitamin B-12 (CYANOCOBALAMIN) 500 MCG tablet Take 2 tablets (1,000 mcg total) by mouth daily.     ziprasidone (GEODON) 80 MG capsule Take 240 mg by mouth at bedtime.      No current facility-administered medications on file prior to visit.    Allergies  Allergen Reactions   Hydrocodone-Homatropine     REACTION: unspecified   Minocycline Hcl     REACTION: Itching   Family History  Problem Relation Age of Onset   Arthritis Mother        fibromyalgia   Alcohol abuse Father    Emphysema Brother        smoker   COPD Brother        smoker, continues.   Arthritis Sister        fibromyalgia   Cancer Neg Hx    Coronary artery disease Neg Hx    Stroke Neg Hx    Diabetes Neg Hx    PE: There were no vitals taken for this visit. Wt Readings from Last 3 Encounters:  11/20/17 179 lb (81.2 kg)  10/12/17 177 lb (80.3 kg)  05/17/17 177 lb 6.4 oz (80.5 kg)   Constitutional:  in NAD  The physical exam was not performed (telephone visit).  ASSESSMENT: 1.  Graves' disease  2. Thyroid nodules  PLAN:  1. Patient with history of thyrotoxicosis diagnosed in 2017, when she presented with thyrotoxic symptoms: Tremors.  But these are chronic for her due to lithium), heat intolerance, palpitations, shortness of breath.  Dose from 5 mg twice a day (increased in 01/2017) to 5 mg once a day (decreased 11/2017).  Of note, she could not tolerate doses lower than 5 mg in the past as her TSH became suppressed again when we tried to decrease the dose.  She was on 5 mg of methimazole once a day, with normal TFTs, last set obtained in 03/2018.  At that time, we decreased the dose to 2.5 mg daily.  She is feeling well on this dose. -She continues to have tremors, which are not new, but otherwise is  asymptomatic.  Her hot flashes are almost resolved. -As of now, I would not suggest RAI ablation or surgery, since she is responding to methimazole.  Low dose of methimazole was shown to be safe and effective in preventing recurrences of Graves' disease -At last check, her TSI's were still high, so for now we will continue the current dose of methimazole -Will recheck her TFTs and another TSI level when she returns to the clinic (now coronavirus pandemic) -She is on beta-blockers for tremors, which should also help with tachycardia that can occur in the setting of hyperthyroidism.  However, she is taking this in the morning and tells me that she feels very tired.  I advised her to move the propranolol at night to help with the fatigue. -I will see the patient back in 6 months  2. Thyroid nodules - No neck compression symptoms - Pt has a 1.5 cm iso--/or slightly hypoechoic thyroid nodule, with internal blood flow for her thyroid ultrasound from 08/2015 the nodule appeared warm on the uptake and scan, which can either indicate an inflammatory nodule (pseudo-nodule) or a normal functioning 1.  Since the nodule was seen in the context of thyrotoxicosis, we decided to wait that if she is euthyroid and repeat a thyroid ultrasound then. - I will order the thyroid ultrasound at next visit  Orders Placed This Encounter  Procedures   TSH   T4, free   T3, free   Thyroid stimulating immunoglobulin   - time spent with the patient: 12 min, of which >50% was spent in obtaining information about her symptoms, reviewing her previous labs, evaluations, and treatments, counseling her about her conditions (please see the discussed topics above), and developing a plan to further investigate and treat them.  Philemon Kingdom, MD PhD [facility] Endocrinology

## 2018-05-22 NOTE — Patient Instructions (Addendum)
Please continue Methimazole 2.5 mg 1x a day with a meal.  Move Propranolol to bedtime.  Please come back for labs in ~1 month.  Please come back for a follow-up appointment in 6 months.

## 2018-06-25 LAB — CBC AND DIFFERENTIAL
Hemoglobin: 10.9 — AB (ref 12.0–16.0)
Platelets: 326 (ref 150–399)
WBC: 12.8

## 2018-07-03 ENCOUNTER — Telehealth: Payer: Self-pay | Admitting: Family Medicine

## 2018-07-03 NOTE — Telephone Encounter (Signed)
Spoke with pt relaying Dr. Synthia Innocent message.  Pt denies any sxs.  Says she will call to schedule an appt if anything develops.

## 2018-07-03 NOTE — Telephone Encounter (Signed)
Received CBC from OBGYN showing elevated white count and mild anemia. Plz call pt - any signs of infection like cough, fever, skin infection, diarrhea? Any blood noted in stool or urine? Would offer OV to further evaluate.

## 2018-07-06 ENCOUNTER — Encounter: Payer: Self-pay | Admitting: Family Medicine

## 2018-07-09 ENCOUNTER — Other Ambulatory Visit (INDEPENDENT_AMBULATORY_CARE_PROVIDER_SITE_OTHER): Payer: 59

## 2018-07-09 ENCOUNTER — Other Ambulatory Visit: Payer: Self-pay

## 2018-07-09 DIAGNOSIS — E0500 Thyrotoxicosis with diffuse goiter without thyrotoxic crisis or storm: Secondary | ICD-10-CM | POA: Diagnosis not present

## 2018-07-09 LAB — T4, FREE: Free T4: 0.41 ng/dL — ABNORMAL LOW (ref 0.60–1.60)

## 2018-07-09 LAB — T3, FREE: T3, Free: 2.8 pg/mL (ref 2.3–4.2)

## 2018-07-09 LAB — TSH: TSH: 1.11 u[IU]/mL (ref 0.35–4.50)

## 2018-07-12 LAB — THYROID STIMULATING IMMUNOGLOBULIN: TSI: 411 % baseline — ABNORMAL HIGH (ref <140)

## 2018-08-08 ENCOUNTER — Telehealth: Payer: Self-pay

## 2018-08-08 NOTE — Telephone Encounter (Signed)
Left detailed VM w COVID screen and back door lab info   

## 2018-08-09 ENCOUNTER — Other Ambulatory Visit (INDEPENDENT_AMBULATORY_CARE_PROVIDER_SITE_OTHER): Payer: 59

## 2018-08-09 ENCOUNTER — Other Ambulatory Visit: Payer: Self-pay

## 2018-08-09 ENCOUNTER — Other Ambulatory Visit: Payer: Self-pay | Admitting: Family Medicine

## 2018-08-09 DIAGNOSIS — F3170 Bipolar disorder, currently in remission, most recent episode unspecified: Secondary | ICD-10-CM

## 2018-08-09 DIAGNOSIS — I10 Essential (primary) hypertension: Secondary | ICD-10-CM | POA: Diagnosis not present

## 2018-08-09 DIAGNOSIS — E538 Deficiency of other specified B group vitamins: Secondary | ICD-10-CM | POA: Diagnosis not present

## 2018-08-09 LAB — COMPREHENSIVE METABOLIC PANEL
ALT: 12 U/L (ref 0–35)
AST: 16 U/L (ref 0–37)
Albumin: 3.9 g/dL (ref 3.5–5.2)
Alkaline Phosphatase: 51 U/L (ref 39–117)
BUN: 17 mg/dL (ref 6–23)
CO2: 27 mEq/L (ref 19–32)
Calcium: 10.1 mg/dL (ref 8.4–10.5)
Chloride: 99 mEq/L (ref 96–112)
Creatinine, Ser: 1.1 mg/dL (ref 0.40–1.20)
GFR: 51.1 mL/min — ABNORMAL LOW (ref >60.00)
Glucose, Bld: 94 mg/dL (ref 70–99)
Potassium: 5.3 mEq/L — ABNORMAL HIGH (ref 3.5–5.1)
Sodium: 132 mEq/L — ABNORMAL LOW (ref 135–145)
Total Bilirubin: 0.3 mg/dL (ref 0.2–1.2)
Total Protein: 6.4 g/dL (ref 6.0–8.3)

## 2018-08-09 LAB — CBC WITH DIFFERENTIAL/PLATELET
Basophils Absolute: 0 10*3/uL (ref 0.0–0.1)
Basophils Relative: 0.6 % (ref 0.0–3.0)
Eosinophils Absolute: 0.3 10*3/uL (ref 0.0–0.7)
Eosinophils Relative: 3 % (ref 0.0–5.0)
HCT: 39.3 % (ref 36.0–46.0)
Hemoglobin: 12.5 g/dL (ref 12.0–15.0)
Lymphocytes Relative: 27.4 % (ref 12.0–46.0)
Lymphs Abs: 2.3 10*3/uL (ref 0.7–4.0)
MCHC: 31.9 g/dL (ref 30.0–36.0)
MCV: 95.9 fl (ref 78.0–100.0)
Monocytes Absolute: 0.7 10*3/uL (ref 0.1–1.0)
Monocytes Relative: 7.9 % (ref 3.0–12.0)
Neutro Abs: 5.2 10*3/uL (ref 1.4–7.7)
Neutrophils Relative %: 61.1 % (ref 43.0–77.0)
Platelets: 312 10*3/uL (ref 150.0–400.0)
RBC: 4.09 Mil/uL (ref 3.87–5.11)
RDW: 17.2 % — ABNORMAL HIGH (ref 11.5–15.5)
WBC: 8.5 10*3/uL (ref 4.0–10.5)

## 2018-08-09 LAB — VITAMIN B12: Vitamin B-12: 564 pg/mL (ref 211–911)

## 2018-08-10 LAB — LITHIUM LEVEL: Lithium Lvl: 0.9 mmol/L (ref 0.6–1.2)

## 2018-08-15 ENCOUNTER — Ambulatory Visit (INDEPENDENT_AMBULATORY_CARE_PROVIDER_SITE_OTHER): Payer: 59 | Admitting: Family Medicine

## 2018-08-15 ENCOUNTER — Encounter: Payer: Self-pay | Admitting: Family Medicine

## 2018-08-15 ENCOUNTER — Other Ambulatory Visit: Payer: Self-pay

## 2018-08-15 VITALS — BP 122/82 | HR 59 | Temp 98.1°F | Ht 61.75 in | Wt 174.2 lb

## 2018-08-15 DIAGNOSIS — Z0000 Encounter for general adult medical examination without abnormal findings: Secondary | ICD-10-CM | POA: Diagnosis not present

## 2018-08-15 DIAGNOSIS — G251 Drug-induced tremor: Secondary | ICD-10-CM

## 2018-08-15 DIAGNOSIS — N289 Disorder of kidney and ureter, unspecified: Secondary | ICD-10-CM | POA: Diagnosis not present

## 2018-08-15 DIAGNOSIS — F3170 Bipolar disorder, currently in remission, most recent episode unspecified: Secondary | ICD-10-CM | POA: Diagnosis not present

## 2018-08-15 DIAGNOSIS — E0500 Thyrotoxicosis with diffuse goiter without thyrotoxic crisis or storm: Secondary | ICD-10-CM

## 2018-08-15 DIAGNOSIS — E875 Hyperkalemia: Secondary | ICD-10-CM

## 2018-08-15 DIAGNOSIS — I10 Essential (primary) hypertension: Secondary | ICD-10-CM

## 2018-08-15 DIAGNOSIS — E871 Hypo-osmolality and hyponatremia: Secondary | ICD-10-CM

## 2018-08-15 NOTE — Assessment & Plan Note (Signed)
Followed by Dr Noemi Chapel. On 5 psych meds (lithium, geodon, wellbutrin XL, buspar and celexa).

## 2018-08-15 NOTE — Assessment & Plan Note (Signed)
Preventative protocols reviewed and updated unless pt declined. Discussed healthy diet and lifestyle.  

## 2018-08-15 NOTE — Patient Instructions (Addendum)
Potassium was high, kidneys were a bit impaired today. Recheck labs today. If staying elevated, I will send results to Noemi Chapel for possible med changes.  For shaking - we will refer you to neurology.  Good to see you today,return in 6 months for follow up visit.    Health Maintenance, Female Adopting a healthy lifestyle and getting preventive care are important in promoting health and wellness. Ask your health care provider about:  The right schedule for you to have regular tests and exams.  Things you can do on your own to prevent diseases and keep yourself healthy. What should I know about diet, weight, and exercise? Eat a healthy diet   Eat a diet that includes plenty of vegetables, fruits, low-fat dairy products, and lean protein.  Do not eat a lot of foods that are high in solid fats, added sugars, or sodium. Maintain a healthy weight Body mass index (BMI) is used to identify weight problems. It estimates body fat based on height and weight. Your health care provider can help determine your BMI and help you achieve or maintain a healthy weight. Get regular exercise Get regular exercise. This is one of the most important things you can do for your health. Most adults should:  Exercise for at least 150 minutes each week. The exercise should increase your heart rate and make you sweat (moderate-intensity exercise).  Do strengthening exercises at least twice a week. This is in addition to the moderate-intensity exercise.  Spend less time sitting. Even light physical activity can be beneficial. Watch cholesterol and blood lipids Have your blood tested for lipids and cholesterol at 57 years of age, then have this test every 5 years. Have your cholesterol levels checked more often if:  Your lipid or cholesterol levels are high.  You are older than 57 years of age.  You are at high risk for heart disease. What should I know about cancer screening? Depending on your health  history and family history, you may need to have cancer screening at various ages. This may include screening for:  Breast cancer.  Cervical cancer.  Colorectal cancer.  Skin cancer.  Lung cancer. What should I know about heart disease, diabetes, and high blood pressure? Blood pressure and heart disease  High blood pressure causes heart disease and increases the risk of stroke. This is more likely to develop in people who have high blood pressure readings, are of African descent, or are overweight.  Have your blood pressure checked: ? Every 3-5 years if you are 51-10 years of age. ? Every year if you are 18 years old or older. Diabetes Have regular diabetes screenings. This checks your fasting blood sugar level. Have the screening done:  Once every three years after age 76 if you are at a normal weight and have a low risk for diabetes.  More often and at a younger age if you are overweight or have a high risk for diabetes. What should I know about preventing infection? Hepatitis B If you have a higher risk for hepatitis B, you should be screened for this virus. Talk with your health care provider to find out if you are at risk for hepatitis B infection. Hepatitis C Testing is recommended for:  Everyone born from [birth year] through 1964.  Anyone with known risk factors for hepatitis C. Sexually transmitted infections (STIs)  Get screened for STIs, including gonorrhea and chlamydia, if: ? You are sexually active and are younger than 57 years of age. ?  You are older than 57 years of age and your health care provider tells you that you are at risk for this type of infection. ? Your sexual activity has changed since you were last screened, and you are at increased risk for chlamydia or gonorrhea. Ask your health care provider if you are at risk.  Ask your health care provider about whether you are at high risk for HIV. Your health care provider may recommend a prescription medicine to  help prevent HIV infection. If you choose to take medicine to prevent HIV, you should first get tested for HIV. You should then be tested every 3 months for as long as you are taking the medicine. Pregnancy  If you are about to stop having your period (premenopausal) and you may become pregnant, seek counseling before you get pregnant.  Take 400 to 800 micrograms (mcg) of folic acid every day if you become pregnant.  Ask for birth control (contraception) if you want to prevent pregnancy. Osteoporosis and menopause Osteoporosis is a disease in which the bones lose minerals and strength with aging. This can result in bone fractures. If you are 48 years old or older, or if you are at risk for osteoporosis and fractures, ask your health care provider if you should:  Be screened for bone loss.  Take a calcium or vitamin D supplement to lower your risk of fractures.  Be given hormone replacement therapy (HRT) to treat symptoms of menopause. Follow these instructions at home: Lifestyle  Do not use any products that contain nicotine or tobacco, such as cigarettes, e-cigarettes, and chewing tobacco. If you need help quitting, ask your health care provider.  Do not use street drugs.  Do not share needles.  Ask your health care provider for help if you need support or information about quitting drugs. Alcohol use  Do not drink alcohol if: ? Your health care provider tells you not to drink. ? You are pregnant, may be pregnant, or are planning to become pregnant.  If you drink alcohol: ? Limit how much you use to 0-1 drink a day. ? Limit intake if you are breastfeeding.  Be aware of how much alcohol is in your drink. In the [place] one drink equals one 12 oz bottle of beer (355 mL), one 5 oz glass of wine (148 mL), or one 1 oz glass of hard liquor (44 mL). General instructions  Schedule regular health, dental, and eye exams.  Stay current with your vaccines.  Tell your health care  provider if: ? You often feel depressed. ? You have ever been abused or do not feel safe at home. Summary  Adopting a healthy lifestyle and getting preventive care are important in promoting health and wellness.  Follow your health care provider's instructions about healthy diet, exercising, and getting tested or screened for diseases.  Follow your health care provider's instructions on monitoring your cholesterol and blood pressure. This information is not intended to replace advice given to you by your health care provider. Make sure you discuss any questions you have with your health care provider. Document Released: 07/12/2010 Document Revised: 12/20/2017 Document Reviewed: 12/20/2017 Elsevier Patient Education  2020 [employer].

## 2018-08-15 NOTE — Assessment & Plan Note (Signed)
Mild. Recheck today.

## 2018-08-15 NOTE — Assessment & Plan Note (Addendum)
Chronic, stable on lisinopril 10mg  daily.

## 2018-08-15 NOTE — Assessment & Plan Note (Signed)
Normal on repeat.

## 2018-08-15 NOTE — Assessment & Plan Note (Deleted)
Appreciate endo care. On low dose methimazole.

## 2018-08-15 NOTE — Assessment & Plan Note (Addendum)
Recent lithium levels on higher range of maintenance normal. Recent worsening of tremors, anticipate after patient stopped propranolol because she didn't feel it was helping. She has several reasons for tremor (lithium, other psychotropics, hyperthyroidism) and may have underlying essential tremor. Will touch base with Noemi Chapel regarding medication regimen. With progressive worsening she requests referral to Dr Jannifer Franklin neurology for further eval/management.

## 2018-08-15 NOTE — Assessment & Plan Note (Signed)
Appreciate endo care. On low dose methimazole.

## 2018-08-15 NOTE — Progress Notes (Signed)
This visit was conducted in person.  BP 122/82 (BP Location: Left Arm, Patient Position: Sitting, Cuff Size: Normal)   Pulse (!) 59   Temp 98.1 F (36.7 C) (Temporal)   Ht 5' 1.75" (1.568 m)   Wt 174 lb 4 oz (79 kg)   SpO2 98%   BMI 32.13 kg/m    CC: CPE Subjective:    Patient ID: Katelyn Price, female    DOB: [birth year], 58 y.o.   MRN: [id number]  HPI: ARLEY Price is a 58 y.o. female presenting on 08/15/2018 for Annual Exam   Noticing increasing easy bruising.   Graves on methimazole 2.5mg  once daily - saw endo Dr Cruzita Lederer 05/2018, note reviewed.   Tremors - stopped propranolol 3 months ago - felt it was ineffective. Would like to see neurologist for further evaluation of marked tremors.   Preventative: COLONOSCOPY6/2016diverticulosis, rpt 10 yrs Collene Mares) Well woman with OBGYN3/2019(Dr. Nori Riis), normal mammogram and pap smear per patient this year. Continues having regular periods. Continues estrace 2mg  daily with progesterone one week per month to have periods.  LMP 06/2018 Flu shot yearly Tdap - 06/2011 Seat belt use discussed  Sunscreen use discussed, no changing moles on skin  Ex smoker - quit 2007. Husband smokes pipe and e-cig Alcohol- none  Dentist Q6 mo Eye exam - overdue  Caffeine: drinks diet drinks  Lives with husband and 1 dog, 1 cat  Occupation: housewife  Activity:no regular exercise  Diet: good water, fruits/vegetables some     Relevant past medical, surgical, family and social history reviewed and updated as indicated. Interim medical history since our last visit reviewed. Allergies and medications reviewed and updated. Outpatient Medications Prior to Visit  Medication Sig Dispense Refill  . buPROPion (WELLBUTRIN XL) 150 MG 24 hr tablet Take 150 mg by mouth daily.     . busPIRone (BUSPAR) 15 MG tablet Take 7.5 mg by mouth 3 (three) times daily.     . Ca & Phos-Vit D-Mag (CALCIUM) [phone] TABS Take by mouth.    . Cholecalciferol  (VITAMIN D) 2000 UNITS CAPS Take 1 capsule by mouth daily.     . citalopram (CELEXA) 10 MG tablet Take 20 mg by mouth daily.     Marland Kitchen estradiol (ESTRACE) 2 MG tablet Take 2 mg by mouth daily.    Marland Kitchen lisinopril (PRINIVIL,ZESTRIL) 10 MG tablet TAKE 1 TABLET (10 MG TOTAL) BY MOUTH DAILY. 90 tablet 3  . lithium carbonate (LITHOBID) 300 MG CR tablet Take 300 mg by mouth 2 (two) times daily.    . methimazole (TAPAZOLE) 5 MG tablet Take 1/2 tablet daily by mouth 60 tablet 5  . pantoprazole (PROTONIX) 40 MG tablet Take 1 tablet (40 mg total) by mouth daily. 90 tablet 3  . polyethylene glycol (MIRALAX / GLYCOLAX) packet Take 17 g by mouth daily.    . progesterone (PROMETRIUM) 200 MG capsule Take 1 capsule (200 mg total) by mouth at bedtime.    . psyllium (METAMUCIL) 58.6 % packet Take 1 packet by mouth daily.    . vitamin B-12 (CYANOCOBALAMIN) 500 MCG tablet Take 2 tablets (1,000 mcg total) by mouth daily.    . ziprasidone (GEODON) 80 MG capsule Take 240 mg by mouth at bedtime.     . tretinoin (RETIN-A) 0.05 % cream APPLY 1 APPLICATION ON THE SKIN NIGHTLY FOR ACNE  3  . amoxicillin (AMOXIL) 875 MG tablet TAKE 3 TABS BY MOUTH ON DAY 1, THEN 2 TABS DAILY UNTIL GONE  0  . clindamycin (CLEOCIN T) 1 % lotion Apply 1 application topically daily.    Marland Kitchen neomycin-polymyxin-hydrocortisone (CORTISPORIN) OTIC solution Apply 1-2 drops to toe after soaking BID 10 mL 1  . neomycin-polymyxin-hydrocortisone (CORTISPORIN) OTIC solution Apply 1-2 drops to toe after soaking twice a day 10 mL 0  . propranolol ER (INDERAL LA) 120 MG 24 hr capsule Take 1 capsule (120 mg total) by mouth daily. (Patient not taking: Reported on 08/15/2018) 90 capsule 3   No facility-administered medications prior to visit.      Per HPI unless specifically indicated in ROS section below Review of Systems  Constitutional: Negative for activity change, appetite change, chills, fatigue, fever and unexpected weight change.  HENT: Negative for hearing  loss.   Eyes: Negative for visual disturbance.  Respiratory: Negative for cough, chest tightness, shortness of breath and wheezing.   Cardiovascular: Positive for palpitations. Negative for chest pain and leg swelling.  Gastrointestinal: Positive for nausea. Negative for abdominal distention, abdominal pain, blood in stool, constipation, diarrhea and vomiting.  Endocrine: Negative for cold intolerance and heat intolerance.  Genitourinary: Negative for difficulty urinating and hematuria.  Musculoskeletal: Negative for arthralgias, myalgias and neck pain.  Skin: Negative for rash.  Neurological: Positive for headaches. Negative for dizziness, seizures and syncope.  Hematological: Negative for adenopathy. Bruises/bleeds easily.  Psychiatric/Behavioral: Positive for dysphoric mood. The patient is nervous/anxious.    Objective:    BP 122/82 (BP Location: Left Arm, Patient Position: Sitting, Cuff Size: Normal)   Pulse (!) 59   Temp 98.1 F (36.7 C) (Temporal)   Ht 5' 1.75" (1.568 m)   Wt 174 lb 4 oz (79 kg)   SpO2 98%   BMI 32.13 kg/m   Wt Readings from Last 3 Encounters:  08/15/18 174 lb 4 oz (79 kg)  11/20/17 179 lb (81.2 kg)  10/12/17 177 lb (80.3 kg)    Physical Exam Vitals signs and nursing note reviewed.  Constitutional:      General: She is not in acute distress.    Appearance: Normal appearance. She is well-developed. She is not ill-appearing.  HENT:     Head: Normocephalic and atraumatic.     Right Ear: Hearing, tympanic membrane, ear canal and external ear normal.     Left Ear: Hearing, tympanic membrane, ear canal and external ear normal.     Nose: Nose normal.     Mouth/Throat:     Mouth: Mucous membranes are moist.     Pharynx: Uvula midline. No oropharyngeal exudate or posterior oropharyngeal erythema.  Eyes:     General: No scleral icterus.    Conjunctiva/sclera: Conjunctivae normal.     Pupils: Pupils are equal, round, and reactive to light.  Neck:      Musculoskeletal: Normal range of motion and neck supple.  Cardiovascular:     Rate and Rhythm: Normal rate and regular rhythm.     Pulses: Normal pulses.          Radial pulses are 2+ on the right side and 2+ on the left side.     Heart sounds: Normal heart sounds. No murmur.  Pulmonary:     Effort: Pulmonary effort is normal. No respiratory distress.     Breath sounds: Normal breath sounds. No wheezing, rhonchi or rales.  Abdominal:     General: Abdomen is flat. Bowel sounds are normal. There is no distension.     Palpations: Abdomen is soft. There is no mass.     Tenderness: There is no  abdominal tenderness. There is no guarding or rebound.     Hernia: No hernia is present.  Musculoskeletal: Normal range of motion.  Lymphadenopathy:     Cervical: No cervical adenopathy.  Skin:    General: Skin is warm and dry.     Findings: No rash.  Neurological:     General: No focal deficit present.     Mental Status: She is alert and oriented to person, place, and time.     Motor: Tremor present.     Comments: Tremor present to bilateral upper extremities and head worse with action and at end of goal directed movements  Psychiatric:        Mood and Affect: Mood normal.        Behavior: Behavior normal.        Thought Content: Thought content normal.        Judgment: Judgment normal.       Results for orders placed or performed in visit on 08/09/18  Lithium level  Result Value Ref Range   Lithium Lvl 0.9 0.6 - 1.2 mmol/L  Vitamin B12  Result Value Ref Range   Vitamin B-12 564 211 - 911 pg/mL  CBC with Differential/Platelet  Result Value Ref Range   WBC 8.5 4.0 - 10.5 K/uL   RBC 4.09 3.87 - 5.11 Mil/uL   Hemoglobin 12.5 12.0 - 15.0 g/dL   HCT 39.3 36.0 - 46.0 %   MCV 95.9 78.0 - 100.0 fl   MCHC 31.9 30.0 - 36.0 g/dL   RDW 17.2 (H) 11.5 - 15.5 %   Platelets 312.0 150.0 - 400.0 K/uL   Neutrophils Relative % 61.1 43.0 - 77.0 %   Lymphocytes Relative 27.4 12.0 - 46.0 %   Monocytes  Relative 7.9 3.0 - 12.0 %   Eosinophils Relative 3.0 0.0 - 5.0 %   Basophils Relative 0.6 0.0 - 3.0 %   Neutro Abs 5.2 1.4 - 7.7 K/uL   Lymphs Abs 2.3 0.7 - 4.0 K/uL   Monocytes Absolute 0.7 0.1 - 1.0 K/uL   Eosinophils Absolute 0.3 0.0 - 0.7 K/uL   Basophils Absolute 0.0 0.0 - 0.1 K/uL  Comprehensive metabolic panel  Result Value Ref Range   Sodium 132 (L) 135 - 145 mEq/L   Potassium 5.3 (H) 3.5 - 5.1 mEq/L   Chloride 99 96 - 112 mEq/L   CO2 27 19 - 32 mEq/L   Glucose, Bld 94 70 - 99 mg/dL   BUN 17 6 - 23 mg/dL   Creatinine, Ser 1.10 0.40 - 1.20 mg/dL   Total Bilirubin 0.3 0.2 - 1.2 mg/dL   Alkaline Phosphatase 51 39 - 117 U/L   AST 16 0 - 37 U/L   ALT 12 0 - 35 U/L   Total Protein 6.4 6.0 - 8.3 g/dL   Albumin 3.9 3.5 - 5.2 g/dL   Calcium 10.1 8.4 - 10.5 mg/dL   GFR 51.10 (L) >60.00 mL/min   Assessment & Plan:   Problem List Items Addressed This Visit    Tremor due to multiple drugs    Recent lithium levels on higher range of maintenance normal. Recent worsening of tremors, anticipate after patient stopped propranolol because she didn't feel it was helping. She has several reasons for tremor (lithium, other psychotropics, hyperthyroidism) and may have underlying essential tremor. Will touch base with Noemi Chapel regarding medication regimen. With progressive worsening she requests referral to Dr Jannifer Franklin neurology for further eval/management.       Relevant Orders  Ambulatory referral to Neurology   Renal insufficiency    Newly noted. Recheck today. If remaining elevated, will touch base with psych regarding lithium levels.       Relevant Orders   Renal function panel   Hyponatremia    Mild. Recheck today.       HYPERTENSION, BENIGN ESSENTIAL    Chronic, stable on lisinopril 10mg  daily.       Hyperkalemia    Encouraged good water intake. Not on potassium supplementation. Consider decreased lisinopril dose if remaining high.       Hypercalcemia    Normal on  repeat.      Healthcare maintenance - Primary    Preventative protocols reviewed and updated unless pt declined. Discussed healthy diet and lifestyle.       Graves disease    Appreciate endo care. On low dose methimazole.       Bipolar disorder ([place])    Followed by Dr Noemi Chapel. On 5 psych meds (lithium, geodon, wellbutrin XL, buspar and celexa).           No orders of the defined types were placed in this encounter.  Orders Placed This Encounter  Procedures  . Renal function panel  . Ambulatory referral to Neurology    Referral Priority:   Routine    Referral Type:   Consultation    Referral Reason:   Specialty Services Required    Requested Specialty:   Neurology    Number of Visits Requested:   1    Follow up plan: No follow-ups on file.  Ria Bush, MD

## 2018-08-15 NOTE — Assessment & Plan Note (Signed)
Encouraged good water intake. Not on potassium supplementation. Consider decreased lisinopril dose if remaining high.

## 2018-08-15 NOTE — Assessment & Plan Note (Signed)
Newly noted. Recheck today. If remaining elevated, will touch base with psych regarding lithium levels.

## 2018-08-16 ENCOUNTER — Telehealth: Payer: Self-pay

## 2018-08-16 LAB — RENAL FUNCTION PANEL
Albumin: 3.9 g/dL (ref 3.5–5.2)
BUN: 17 mg/dL (ref 6–23)
CO2: 25 mEq/L (ref 19–32)
Calcium: 10.2 mg/dL (ref 8.4–10.5)
Chloride: 104 mEq/L (ref 96–112)
Creatinine, Ser: 1.01 mg/dL (ref 0.40–1.20)
GFR: 56.39 mL/min — ABNORMAL LOW (ref >60.00)
Glucose, Bld: 98 mg/dL (ref 70–99)
Phosphorus: 3.3 mg/dL (ref 2.3–4.6)
Potassium: 5.7 mEq/L — ABNORMAL HIGH (ref 3.5–5.1)
Sodium: 135 mEq/L (ref 135–145)

## 2018-08-16 NOTE — Telephone Encounter (Signed)
[facility] Night - Client Nonclinical Telephone Record AccessNurse Client [place] Night - Client Client Site [place] Physician Ria Bush - MD Contact Type Call Who Is Calling Patient / Member / Family / Caregiver Caller Name [place] Phone Number [phone] Call Type Message Only Information Provided Reason for Call Returning a Call from the Office Initial [place] states she needs to speak to a nurse at the office. Patient wants them to know that she is on Propanol. Additional Comment Refused triage. Call Closed By: Modesta Messing Transaction Date/Time: 08/15/2018 5:28:42 PM (ET)

## 2018-08-16 NOTE — Telephone Encounter (Addendum)
Spoke with pt asking about meds.  States she realized it was the progesterone she was looking at and it is already on her list.

## 2018-08-17 ENCOUNTER — Other Ambulatory Visit: Payer: Self-pay | Admitting: Family Medicine

## 2018-08-17 DIAGNOSIS — E875 Hyperkalemia: Secondary | ICD-10-CM

## 2018-08-22 ENCOUNTER — Other Ambulatory Visit: Payer: Self-pay

## 2018-08-22 ENCOUNTER — Other Ambulatory Visit (INDEPENDENT_AMBULATORY_CARE_PROVIDER_SITE_OTHER): Payer: 59

## 2018-08-22 DIAGNOSIS — E875 Hyperkalemia: Secondary | ICD-10-CM | POA: Diagnosis not present

## 2018-08-22 LAB — BASIC METABOLIC PANEL
BUN: 14 mg/dL (ref 6–23)
CO2: 26 mEq/L (ref 19–32)
Calcium: 10.1 mg/dL (ref 8.4–10.5)
Chloride: 99 mEq/L (ref 96–112)
Creatinine, Ser: 0.98 mg/dL (ref 0.40–1.20)
GFR: 58.38 mL/min — ABNORMAL LOW (ref >60.00)
Glucose, Bld: 85 mg/dL (ref 70–99)
Potassium: 4.7 mEq/L (ref 3.5–5.1)
Sodium: 132 mEq/L — ABNORMAL LOW (ref 135–145)

## 2018-08-24 ENCOUNTER — Other Ambulatory Visit: Payer: Self-pay | Admitting: Family Medicine

## 2018-10-09 ENCOUNTER — Encounter: Payer: Self-pay | Admitting: Neurology

## 2018-10-09 ENCOUNTER — Other Ambulatory Visit: Payer: Self-pay | Admitting: Family Medicine

## 2018-10-09 ENCOUNTER — Other Ambulatory Visit: Payer: Self-pay

## 2018-10-09 ENCOUNTER — Ambulatory Visit (INDEPENDENT_AMBULATORY_CARE_PROVIDER_SITE_OTHER): Payer: 59 | Admitting: Neurology

## 2018-10-09 DIAGNOSIS — R251 Tremor, unspecified: Secondary | ICD-10-CM

## 2018-10-09 DIAGNOSIS — G43019 Migraine without aura, intractable, without status migrainosus: Secondary | ICD-10-CM

## 2018-10-09 HISTORY — DX: Tremor, unspecified: R25.1

## 2018-10-09 HISTORY — DX: Migraine without aura, intractable, without status migrainosus: G43.019

## 2018-10-09 MED ORDER — TOPIRAMATE 25 MG PO TABS: ORAL_TABLET | ORAL | 3 refills | Status: DC

## 2018-10-09 MED ORDER — RIZATRIPTAN BENZOATE 10 MG PO TABS: 10.0000 mg | ORAL_TABLET | Freq: Three times a day (TID) | ORAL | 3 refills | Status: DC | PRN

## 2018-10-09 NOTE — Progress Notes (Signed)
Reason for visit: Tremor, migraine headache  Referring physician: Dr. Kathaleen Bury is a 58 y.o. female  History of present illness:  Katelyn Price is a 58 year old right-handed white female with a history of bipolar disorder.  The patient has been on Geodon for quite a number of years and she has been on lithium for almost 20 years.  The patient denies any tremor affecting the head or neck or with the voice.  She denies tremor with the lower extremities.  She has not had a significant change in her balance, she will stumble on occasion however.  She has been placed on Requip apparently for the tremor without benefit.  No family history of tremor is reported.  She was on propranolol, but recently she was taken off of this medication because it did not help.  The patient does have migraine headaches on average about twice a week associated with some nausea without vomiting.  She may have some photophobia without phonophobia.  She will take Excedrin Migraine, the caffeine in the medication will increase the tremor.  The patient denies issues controlling the bowels or the bladder, she denies any vision changes or difficulty with speech or swallowing.  She believes that the tremor in the hands has been there for about 4 or 5 years and is gradually worsening over time.  She is sent to this office for an evaluation.   Past Medical History:  Diagnosis Date  . Abnormal involuntary movements(781.0)   . Acne   . Allergic rhinitis   . Bipolar disorder, unspecified (Katelyn Price)    psych - Dr. Lattie Haw  . Depression   . Diaphragmatic hernia without mention of obstruction or gangrene   . GERD (gastroesophageal reflux disease)    03/18/02, EGD, [facility] Gerd,gastritis, dilated  . History of MRI of brain and brain stem 06/01/03   wnl  . Hypertension   . Other psoriasis   . Unspecified sinusitis (chronic)     Past Surgical History:  Procedure Laterality Date  . COLONOSCOPY  03/18/02   internal  hemorrhoids  . COLONOSCOPY  06/2014   diverticulosis, rpt 10 yrs Collene Mares)  . ESOPHAGOGASTRODUODENOSCOPY  1/99   Sharlett Iles; HH,GERD  . ESOPHAGOGASTRODUODENOSCOPY  03/18/02   HH,GERD,Gastritis; Dilated  . LAPAROSCOPIC ESOPHAGOGASTRIC FUNDOPLASTY  08/28/02   Hassell Done    Family History  Problem Relation Age of Onset  . Arthritis Mother        fibromyalgia  . Alcohol abuse Father   . Emphysema Brother        smoker  . COPD Brother        smoker, continues.  . Arthritis Sister        fibromyalgia  . Cancer Neg Hx   . Coronary artery disease Neg Hx   . Stroke Neg Hx   . Diabetes Neg Hx     Social history:  reports that she quit smoking about 20 years ago. Her smoking use included cigarettes. She has a 20.00 pack-year smoking history. She has never used smokeless tobacco. She reports that she does not drink alcohol or use drugs.  Medications:  Prior to Admission medications   Medication Sig Start Date End Date Taking? Authorizing Provider  buPROPion (WELLBUTRIN XL) 150 MG 24 hr tablet Take 150 mg by mouth daily.    Yes [provider]  busPIRone (BUSPAR) 15 MG tablet Take 7.5 mg by mouth 3 (three) times daily.    Yes [provider]  Ca & Phos-Vit  D-Mag (CALCIUM) [phone] TABS Take by mouth.   Yes [provider]  Cholecalciferol (VITAMIN D) 2000 UNITS CAPS Take 1 capsule by mouth daily.    Yes [provider]  citalopram (CELEXA) 10 MG tablet Take 20 mg by mouth daily.    Yes [provider]  estradiol (ESTRACE) 2 MG tablet Take 2 mg by mouth daily.   Yes [provider]  lithium carbonate (LITHOBID) 300 MG CR tablet Take 300 mg by mouth 2 (two) times daily.   Yes [provider]  methimazole (TAPAZOLE) 5 MG tablet Take 1/2 tablet daily by mouth 03/23/18  Yes Philemon Kingdom, MD  pantoprazole (PROTONIX) 40 MG tablet Take 1 tablet (40 mg total) by mouth daily. 10/12/17  Yes Ria Bush, MD  polyethylene glycol  [facility] / Floria Raveling) packet Take 17 g by mouth daily.   Yes [provider]  progesterone (PROMETRIUM) 200 MG capsule Take 1 capsule (200 mg total) by mouth at bedtime. 10/12/17  Yes Ria Bush, MD  propranolol ER (INDERAL LA) 120 MG 24 hr capsule Take by mouth daily. 08/30/18  Yes [provider]  psyllium (METAMUCIL) 58.6 % packet Take 1 packet by mouth daily.   Yes [provider]  tretinoin (RETIN-A) 0.05 % cream APPLY 1 APPLICATION ON THE SKIN NIGHTLY FOR ACNE 10/10/17  Yes [provider]  vitamin B-12 (CYANOCOBALAMIN) 500 MCG tablet Take 2 tablets (1,000 mcg total) by mouth daily. 10/12/17  Yes Ria Bush, MD  ziprasidone (GEODON) 80 MG capsule Take 240 mg by mouth at bedtime.    Yes [provider]      Allergies  Allergen Reactions  . Hydrocodone-Homatropine     REACTION: unspecified  . Minocycline Hcl     REACTION: Itching    ROS:  Out of a complete 14 system review of symptoms, the patient complains only of the following symptoms, and all other reviewed systems are negative.  Tremor Depression Allergies Easy bruising  Blood pressure (!) 136/101, pulse 63, temperature 98.2 F (36.8 C), temperature source Temporal, height 5\' 2"  (1.575 m), weight 173 lb (78.5 kg).  Physical Exam  General: The patient is alert and cooperative at the time of the examination.  Eyes: Pupils are equal, round, and reactive to light. Discs are flat bilaterally.  Neck: The neck is supple, no carotid bruits are noted.  Respiratory: The respiratory examination is clear.  Cardiovascular: The cardiovascular examination reveals a regular rate and rhythm, no obvious murmurs or rubs are noted.  Skin: Extremities are without significant edema.  Neurologic Exam  Mental status: The patient is alert and oriented x 3 at the time of the examination. The patient has apparent normal recent and remote memory, with an apparently normal attention span  and concentration ability.  Cranial nerves: Facial symmetry is present. There is good sensation of the face to pinprick and soft touch bilaterally. The strength of the facial muscles and the muscles to head turning and shoulder shrug are normal bilaterally. Speech is well enunciated, no aphasia or dysarthria is noted. Extraocular movements are full. Visual fields are full. The tongue is midline, and the patient has symmetric elevation of the soft palate. No obvious hearing deficits are noted.  Motor: The motor testing reveals 5 over 5 strength of all 4 extremities. Good symmetric motor tone is noted throughout.  Sensory: Sensory testing is intact to pinprick, soft touch, vibration sensation, and position sense on all 4 extremities. No evidence of extinction is noted.  Coordination:  Cerebellar testing reveals good finger-nose-finger and heel-to-shin bilaterally.  The patient has prominent tremor with finger-nose-finger and with arms outstretched.  At times, resting tremors are seen in the right greater than left upper extremity.  With performing handwriting, tremor is translated into the writing.  Gait and station: Gait is normal. Tandem gait is normal. Romberg is negative. No drift is seen.  Reflexes: Deep tendon reflexes are symmetric and normal bilaterally. Toes are downgoing bilaterally.   Assessment/Plan:  1.  Bilateral upper extremity tremor, with action and resting component  2.  Common migraine, intractable  The patient has prominent tremors that are mainly action type tremors likely associated with the lithium.  The patient has been on lithium for greater than 20 years and this may lead to toxicity of the cerebellum resulting in a permanent tremor.  The tremor will continue to worsen as long she is on the lithium.  If possible, transitioning off of lithium to another medication should be considered.  The patient also has intractable migraine, she has at least 2 headaches a week, we will  initiate treatment with Topamax working up to 75 mg at night, she will be given Maxalt to take if needed.  She will follow-up here in about 3 months.  Jill Alexanders MD 10/09/2018 8:43 AM  [facility] [address] [place], [place] [address]  Phone [phone] Fax [phone]

## 2018-10-09 NOTE — Patient Instructions (Signed)
We will start Topamax for the headache.  Use Maxalt if needed when the headache comes on.  Topamax (topiramate) is a seizure medication that has an FDA approval for seizures and for migraine headache. Potential side effects of this medication include weight loss, cognitive slowing, tingling in the fingers and toes, and carbonated drinks will taste bad. If any significant side effects are noted on this drug, please contact our office.

## 2018-10-12 ENCOUNTER — Ambulatory Visit (INDEPENDENT_AMBULATORY_CARE_PROVIDER_SITE_OTHER): Payer: 59 | Admitting: Internal Medicine

## 2018-10-12 ENCOUNTER — Other Ambulatory Visit: Payer: Self-pay

## 2018-10-12 ENCOUNTER — Encounter: Payer: Self-pay | Admitting: Internal Medicine

## 2018-10-12 VITALS — BP 120/70 | HR 68 | Ht 61.5 in | Wt 174.0 lb

## 2018-10-12 DIAGNOSIS — E0500 Thyrotoxicosis with diffuse goiter without thyrotoxic crisis or storm: Secondary | ICD-10-CM

## 2018-10-12 DIAGNOSIS — E042 Nontoxic multinodular goiter: Secondary | ICD-10-CM

## 2018-10-12 LAB — TSH: TSH: 0.44 u[IU]/mL (ref 0.35–4.50)

## 2018-10-12 LAB — T4, FREE: Free T4: 0.55 ng/dL — ABNORMAL LOW (ref 0.60–1.60)

## 2018-10-12 LAB — T3, FREE: T3, Free: 3.3 pg/mL (ref 2.3–4.2)

## 2018-10-12 NOTE — Patient Instructions (Addendum)
Please continue Methimazole 2.5 mg daily with a meal.  Please stop at the lab.  You will be called to schedule the new thyroid U/S.  Please come back for a follow-up appointment in 6 months.

## 2018-10-12 NOTE — Progress Notes (Addendum)
Patient ID: Katelyn Price, female   DOB: 1960/11/17, 58 y.o.   MRN: [id number]   HPI  Katelyn Price is a 58 y.o.-year-old female, presenting for f/u for Graves ds. and thyroid nodules. Last visit 4 months ago.  She sees a new neurologist >> will be taken off Lithium to hopefully improve the tremors. Now titrating up Topamax.  Reviewed hx: Patient was diagnosed with thyrotoxicosis in summer 2017 and was confirmed as Graves' disease on an uptake and scan from 10/2015.   Reviewed her TFTs: Lab Results  Component Value Date   TSH 1.11 07/09/2018   TSH 1.55 03/23/2018   TSH 1.75 12/26/2017   TSH 6.31 (H) 11/20/2017   TSH 3.27 08/23/2017   TSH 1.32 05/31/2017   TSH 0.20 (L) 04/25/2017   TSH 0.02 (L) 03/17/2017   TSH <0.01 (L) 02/09/2017   TSH <0.01 (L) 12/28/2016   FREET4 0.41 (L) 07/09/2018   FREET4 0.46 (L) 03/23/2018   FREET4 0.42 (L) 12/26/2017   FREET4 0.42 (L) 11/20/2017   FREET4 0.41 (L) 08/23/2017   FREET4 0.51 (L) 05/31/2017   FREET4 0.53 (L) 04/25/2017   FREET4 0.64 03/17/2017   FREET4 0.84 02/09/2017   FREET4 0.93 12/28/2016   Lab Results  Component Value Date   T3FREE 2.8 07/09/2018   T3FREE 3.7 03/23/2018   T3FREE 3.2 12/26/2017   T3FREE 3.2 11/20/2017   T3FREE 3.0 08/23/2017   T3FREE 3.7 05/31/2017   T3FREE 3.7 04/25/2017   T3FREE 2.9 03/17/2017   T3FREE 3.4 02/09/2017   T3FREE 4.1 12/28/2016   Graves' antibodies are elvated: Lab Results  Component Value Date   TSI 411 (H) 07/09/2018   TSI 499 (H) 05/31/2017   TSI 442 (H) 11/14/2016   TSI 630 (H) 10/14/2015   Component     Latest Ref Rng & Units 08/25/2015  Thyrotropin Receptor Ab     <=16.0 % 41.4 (H)   Reviewed the previous test reports and images: Thyroid ultrasound on 09/04/2015: Right thyroid lobe: 4.1 cm x 1.7 cm x 1.9 cm. Relatively homogeneous appearance of right thyroid tissue.  Right thyroid nodule #1 measures 0.7 cm x 0.7 cm x 0.7 cm mixed cystic and solid composition (1), with  hypoechoic echogenicity (2),  taller than wide shape (3) Left thyroid lobe: 5.1 cm x 1.9 cm x 2.4 cm. Relatively homogeneous appearance of the left thyroid.  *Inferior left nodule #1 measures 1.5 cm x 1.1 cm x 1.3 cm Nearly completely solid composition (2), with isoechoic  echogenicity (1).   There are 3 additional separate nodules all measuring less than 5 mm with characteristics compatible with colloid cysts. Isthmus Thickness: 0.4 cm.  No nodules visualized. Lymphadenopathy: None visualized.  Thyroid Uptake and scan (10/22/2015) >> Graves' disease.: 24 hour radioactive iodine uptake 36.9%.Diffuse radiotracer uptake identified within both lobes of the thyroid gland.  No dominant  She was initially started on methimazole 10 mg twice a day, then decreased to 5 mg twice a day in 02/2016 after which TFTs normalized.  We were able to decrease the dose further to 5 mg daily.  We have tried an even lower dose but her TSH became suppressed on less than 5 mg methimazole daily.    At last visit she was on 5 mg twice a day of methimazole, dose increased 01/2017.    In 11/2017, we were able to decrease the dose to 5 mg once a day.  In 03/2018 we were able to decrease the dose  to 2.5 mg once a day.  She is on propranolol 80 mg in a.m. for tremors.  She is on lithium for bipolar disorder.  Pt denies: - feeling nodules in neck - hoarseness - dysphagia - choking - SOB with lying down  Pt does have a FH of thyroid ds on father's side of the family. No FH of thyroid cancer. No h/o radiation tx to head or neck.  No seaweed or kelp. No recent contrast studies. No herbal supplements. No Biotin use. No recent steroids use.   Pt. also has a history of GERD, psoriasis, bipolar dis., HTN, B12 deficiency.   B12 levels normal at last check: Lab Results  Component Value Date   VITAMINB12 564 08/09/2018   VITAMINB12 1,157 (H) 10/05/2017   VITAMINB12 968 (H) 08/22/2016   VITAMINB12 >1500 (H) 08/21/2015    VITAMINB12 >1500 (H) 08/11/2014   VITAMINB12 189 (L) 07/26/2013   VITAMINB12 127 (L) 09/03/2012   + History of vaginal spotting-seeing OB/GYN (Dr. Milta Deiters).  On HRT.  ROS: Constitutional: no weight gain/no weight loss, no fatigue, no subjective hyperthermia, no subjective hypothermia Eyes: no blurry vision, no xerophthalmia ENT: no sore throat, + see HPI Cardiovascular: no CP/no SOB/no palpitations/no leg swelling Respiratory: no cough/no SOB/no wheezing Gastrointestinal: no N/no V/no D/no C/no acid reflux Musculoskeletal: no muscle aches/no joint aches Skin: no rashes, no hair loss Neurological: + tremors/no numbness/no tingling/no dizziness  I reviewed pt's medications, allergies, PMH, social hx, family hx, and changes were documented in the history of present illness. Otherwise, unchanged from my initial visit note.  Past Medical History:  Diagnosis Date  . Abnormal involuntary movements(781.0)   . Acne   . Allergic rhinitis   . Bipolar disorder, unspecified ([place])    psych - Dr. Lattie Haw  . Common migraine with intractable migraine 10/09/2018  . Depression   . Diaphragmatic hernia without mention of obstruction or gangrene   . GERD (gastroesophageal reflux disease)    03/18/02, EGD, [facility]., Gerd,gastritis, dilated  . History of MRI of brain and brain stem 06/01/03   wnl  . Hypertension   . Other psoriasis   . Tremor of both hands 10/09/2018  . Unspecified sinusitis (chronic)    Past Surgical History:  Procedure Laterality Date  . COLONOSCOPY  03/18/02   internal hemorrhoids  . COLONOSCOPY  06/2014   diverticulosis, rpt 10 yrs Collene Mares)  . ESOPHAGOGASTRODUODENOSCOPY  1/99   Sharlett Iles; HH,GERD  . ESOPHAGOGASTRODUODENOSCOPY  03/18/02   HH,GERD,Gastritis; Dilated  . LAPAROSCOPIC ESOPHAGOGASTRIC FUNDOPLASTY  08/28/02   Hassell Done   Social History   Social History  . Marital status: Married    Spouse name: N/A  . Number of children: 0   Occupational History  . Housewife    Social  History Main Topics  . Smoking status: Former Smoker    Packs/day: 1.00    Years: 20.00    Types: Cigarettes    Quit date: 01/10/1998  . Smokeless tobacco: Never Used  . Alcohol use No  . Drug use: No   Social History Narrative   Caffeine: drinks diet drinks   Lives with husband and 1 dog, 1 cat   Occupation: housewife   Activity: no regular exercise, doesn't feel safe to walk outside at home, could walk on mother's treadmill   Diet: good water, fruits/vegetables some    Current Outpatient Medications on File Prior to Visit  Medication Sig Dispense Refill  . buPROPion (WELLBUTRIN XL) 150 MG 24 hr tablet Take 150 mg by  mouth daily.     . busPIRone (BUSPAR) 15 MG tablet Take 7.5 mg by mouth 3 (three) times daily.     . Ca & Phos-Vit D-Mag (CALCIUM) [phone] TABS Take by mouth.    . Cholecalciferol (VITAMIN D) 2000 UNITS CAPS Take 1 capsule by mouth daily.     . citalopram (CELEXA) 10 MG tablet Take 20 mg by mouth daily.     Marland Kitchen estradiol (ESTRACE) 2 MG tablet Take 2 mg by mouth daily.    Marland Kitchen lithium carbonate (LITHOBID) 300 MG CR tablet Take 300 mg by mouth 2 (two) times daily.    . methimazole (TAPAZOLE) 5 MG tablet Take 1/2 tablet daily by mouth 60 tablet 5  . pantoprazole (PROTONIX) 40 MG tablet TAKE 1 TABLET BY MOUTH EVERY DAY 90 tablet 3  . polyethylene glycol (MIRALAX / GLYCOLAX) packet Take 17 g by mouth daily.    . progesterone (PROMETRIUM) 200 MG capsule Take 1 capsule (200 mg total) by mouth at bedtime.    . propranolol ER (INDERAL LA) 120 MG 24 hr capsule Take by mouth daily.    . psyllium (METAMUCIL) 58.6 % packet Take 1 packet by mouth daily.    . rizatriptan (MAXALT) 10 MG tablet Take 1 tablet (10 mg total) by mouth 3 (three) times daily as needed for migraine. May repeat in 2 hours if needed 10 tablet 3  . topiramate (TOPAMAX) 25 MG tablet Take one tablet at night for one week, then take 2 tablets at night for one week, then take 3 tablets at night. 90 tablet 3  .  tretinoin (RETIN-A) 0.05 % cream APPLY 1 APPLICATION ON THE SKIN NIGHTLY FOR ACNE  3  . vitamin B-12 (CYANOCOBALAMIN) 500 MCG tablet Take 2 tablets (1,000 mcg total) by mouth daily.    . ziprasidone (GEODON) 80 MG capsule Take 240 mg by mouth at bedtime.      No current facility-administered medications on file prior to visit.    Allergies  Allergen Reactions  . Hydrocodone-Homatropine     REACTION: unspecified  . Minocycline Hcl     REACTION: Itching   Family History  Problem Relation Age of Onset  . Arthritis Mother        fibromyalgia  . Alcohol abuse Father   . Emphysema Brother        smoker  . COPD Brother        smoker, continues.  . Arthritis Sister        fibromyalgia  . Cancer Neg Hx   . Coronary artery disease Neg Hx   . Stroke Neg Hx   . Diabetes Neg Hx    PE: BP 120/70   Pulse 68   Ht 5' 1.5" (1.562 m)   Wt 174 lb (78.9 kg)   SpO2 98%   BMI 32.34 kg/m  Wt Readings from Last 3 Encounters:  10/12/18 174 lb (78.9 kg)  10/09/18 173 lb (78.5 kg)  08/15/18 174 lb 4 oz (79 kg)   Constitutional: overweight, in NAD Eyes: PERRLA, EOMI, no exophthalmos ENT: moist mucous membranes, no thyromegaly, no cervical lymphadenopathy Cardiovascular: RRR, No MRG Respiratory: CTA B Gastrointestinal: abdomen soft, NT, ND, BS+ Musculoskeletal: no deformities, strength intact in all 4 Skin: moist, warm, no rashes Neurological: +++ Tremor with outstretched hands, DTR normal in all 4  ASSESSMENT: 1.  Graves' disease  2. Thyroid nodules  PLAN:  1. Patient with history of thyrotoxicosis diagnosed in 2017, when she presented with thyrotoxic symptoms: Tremors (  chronic, due to lithium), heat intolerance, palpitations, shortness of breath.  She started methimazole and we were able to decrease the dose gradually to 2.5 mg daily.   -She feels well on this dose, but continues to have tremors, which are chronic for her, in the setting of lithium use.   -Latest TSH level was normal  at 06/2018 -Latest TSI's were still high -We will repeat her TFTs today -She continues on beta-blockers for tremors, and that should also help with tachycardia -At last visit she was complaining of fatigue and she was taking the beta-blocker (propranolol >> now 120 mg) in the morning and I advised her to move this at night. She is still taking it in am, but does not c/o significant fatigue. -I will see her back in 6 months  2. Thyroid nodules -No neck compression symptoms -We reviewed the fact that she has a 1.5 cm  iso-/or slightly hypoechoic thyroid nodule, with internal blood flow on her ultrasound from 08/2015.  This nodule appeared warm on the uptake and scan which can either indicate an inflammatory nodule (pseudo-nodule) or a normal functioning one.  Since the nodule was seen in the context of thyrotoxicosis, we decided to wait until she was euthyroid and repeat the ultrasound then. -We will order a repeat thyroid ultrasound now  Orders Placed This Encounter  Procedures  . US THYROID  . TSH  . T4, free  . T3, free   - time spent with the patient: 25 min, of which >50% was spent in obtaining information about her symptoms, reviewing her previous labs, evaluations, and treatments, counseling her about her conditions (please see the discussed topics above), and developing a plan to further investigate and treat them.  Office Visit on 10/12/2018  Component Date Value Ref Range Status  . TSH 10/12/2018 0.44  0.35 - 4.50 uIU/mL Final  . Free T4 10/12/2018 0.55* 0.60 - 1.60 ng/dL Final   Comment: Specimens from patients who are undergoing biotin therapy and /or ingesting biotin supplements may contain high levels of biotin.  The higher biotin concentration in these specimens interferes with this Free T4 assay.  Specimens that contain high levels  of biotin may cause false high results for this Free T4 assay.  Please interpret results in light of the total clinical presentation of the  patient.    . T3, Free 10/12/2018 3.3  2.3 - 4.2 pg/mL Final   TSH is lower than before and trending down.  I suggested to continue methimazole 2.5 mg daily but come back for another set of labs in 2 months.  At that time, we may need to increase methimazole.  I will refrain from doing this now, since her free T4 is slightly low.   Thyroid U/S (10/26/2018): Parenchymal Echotexture: Moderately heterogenous Isthmus: Normal in size measuring 0.4 cm in diameter Right lobe: Normal in size measuring 5.2 x 2.1 x 2.2 cm, unchanged previously, 4.1 x 1.7 x 1.9 cm Left lobe: Normal in size measuring 5.4 x 2.0 x 2.4 cm, unchanged, previously, 5.1 x 1.9 x 2.4 cm _________________________________________________________  Previously noted approximately 0.7 cm minimally complex cyst within the inferior pole the right lobe of the thyroid is not demonstrated on the present examination. __________________________________________________  The previously noted approximately 1.5 x 1.3 x 1.1 cm isoechoic nodule within the mid, inferior aspect the left lobe of the thyroid is not seen on the present examination and thus may have represented a pseudonodule.  There is an approximately 1.0 x  1.0 x 0.8 cm isoechoic ill-defined nodule/pseudonodule within the mid aspect the left lobe of the thyroid (labeled 1) was not definitely seen on the 2017 examination however does not meet imaging criteria to recommend percutaneous sampling or continued dedicated follow-up.  There is an approximately 1.0 x 0.9 x 0.9 cm partially cystic, partially solid nodule within the inferior pole the left lobe of the thyroid (labeled 2), which was not definitely seen on the 2017 examination, however does not meet imaging criteria to recommend percutaneous sampling or continued dedicated follow-up.  IMPRESSION: 1. Findings suggestive of multinodular goiter. 2. No worrisome new or enlarging thyroid nodules. 3. None of the  discretely measured thyroid nodules meet imaging criteria to recommend percutaneous sampling or continued dedicated follow-up.  Electronically Signed   By: Sandi Mariscal M.D.   On: 10/26/2018 10:45        Philemon Kingdom, MD PhD [facility] Endocrinology

## 2018-10-26 ENCOUNTER — Ambulatory Visit
Admission: RE | Admit: 2018-10-26 | Discharge: 2018-10-26 | Disposition: A | Discharge status: Home / Self Care | Payer: 59 | Source: Ambulatory Visit | Attending: Internal Medicine | Admitting: Internal Medicine

## 2018-10-26 DIAGNOSIS — E042 Nontoxic multinodular goiter: Secondary | ICD-10-CM

## 2018-12-10 ENCOUNTER — Other Ambulatory Visit: Payer: Self-pay

## 2018-12-10 ENCOUNTER — Encounter: Payer: Self-pay | Admitting: Family Medicine

## 2018-12-10 ENCOUNTER — Ambulatory Visit (INDEPENDENT_AMBULATORY_CARE_PROVIDER_SITE_OTHER): Payer: 59 | Admitting: Family Medicine

## 2018-12-10 ENCOUNTER — Ambulatory Visit (INDEPENDENT_AMBULATORY_CARE_PROVIDER_SITE_OTHER)
Admission: RE | Admit: 2018-12-10 | Discharge: 2018-12-10 | Disposition: A | Discharge status: Home / Self Care | Payer: 59 | Source: Ambulatory Visit | Attending: Family Medicine | Admitting: Family Medicine

## 2018-12-10 VITALS — BP 130/82 | HR 65 | Temp 98.0°F | Ht 61.5 in | Wt 173.3 lb

## 2018-12-10 DIAGNOSIS — M25551 Pain in right hip: Secondary | ICD-10-CM | POA: Diagnosis not present

## 2018-12-10 DIAGNOSIS — S6991XA Unspecified injury of right wrist, hand and finger(s), initial encounter: Secondary | ICD-10-CM | POA: Diagnosis not present

## 2018-12-10 DIAGNOSIS — E875 Hyperkalemia: Secondary | ICD-10-CM

## 2018-12-10 DIAGNOSIS — S62031G Displaced fracture of proximal third of navicular [scaphoid] bone of right wrist, subsequent encounter for fracture with delayed healing: Secondary | ICD-10-CM

## 2018-12-10 DIAGNOSIS — G251 Drug-induced tremor: Secondary | ICD-10-CM

## 2018-12-10 DIAGNOSIS — M1611 Unilateral primary osteoarthritis, right hip: Secondary | ICD-10-CM | POA: Insufficient documentation

## 2018-12-10 DIAGNOSIS — F3170 Bipolar disorder, currently in remission, most recent episode unspecified: Secondary | ICD-10-CM | POA: Diagnosis not present

## 2018-12-10 MED ORDER — ACETAMINOPHEN-CODEINE #3 300-30 MG PO TABS: 1.0000 | ORAL_TABLET | Freq: Three times a day (TID) | ORAL | 0 refills | Status: DC | PRN

## 2018-12-10 NOTE — Progress Notes (Signed)
This visit was conducted in person.  BP 130/82 (BP Location: Left Arm, Patient Position: Sitting, Cuff Size: Normal)   Pulse 65   Temp 98 F (36.7 C) (Temporal)   Ht 5' 1.5" (1.562 m)   Wt 173 lb 5 oz (78.6 kg)   SpO2 97%   BMI 32.22 kg/m    CC: fall with injury Subjective:    Patient ID: Katelyn Price, female    DOB: Apr 26, 1960, 58 y.o.   MRN: [id number]  HPI: Katelyn Price is a 58 y.o. female presenting on 12/10/2018 for Arm Pain (C/o right arm and right hip pain due to a fall about 1 mo ago.  Pain is increasing. )   DOI: 1 mo ago [place] down steps outside at home. Landed on R side (wrist and hip), landed on cement. Feels forearm progressively worsening.  Treating pain with tylenol 2000mg  and ibuprofen 800mg  BID with limited benefit.   Hip also staying sore but not as bad as forearm.      Relevant past medical, surgical, family and social history reviewed and updated as indicated. Interim medical history since our last visit reviewed. Allergies and medications reviewed and updated. Outpatient Medications Prior to Visit  Medication Sig Dispense Refill  . buPROPion (WELLBUTRIN XL) 150 MG 24 hr tablet Take 150 mg by mouth daily.     . busPIRone (BUSPAR) 15 MG tablet Take 7.5 mg by mouth 3 (three) times daily.     . Ca & Phos-Vit D-Mag (CALCIUM) [phone] TABS Take by mouth.    . Cholecalciferol (VITAMIN D) 2000 UNITS CAPS Take 1 capsule by mouth daily.     . citalopram (CELEXA) 10 MG tablet Take 20 mg by mouth daily.     Marland Kitchen estradiol (ESTRACE) 2 MG tablet Take 2 mg by mouth daily.    Marland Kitchen lithium carbonate (LITHOBID) 300 MG CR tablet Take 300 mg by mouth 2 (two) times daily.    . methimazole (TAPAZOLE) 5 MG tablet Take 1/2 tablet daily by mouth 60 tablet 5  . pantoprazole (PROTONIX) 40 MG tablet TAKE 1 TABLET BY MOUTH EVERY DAY 90 tablet 3  . polyethylene glycol (MIRALAX / GLYCOLAX) packet Take 17 g by mouth daily.    . progesterone (PROMETRIUM) 200 MG capsule Take 1  capsule (200 mg total) by mouth at bedtime.    . propranolol ER (INDERAL LA) 120 MG 24 hr capsule Take by mouth daily.    . psyllium (METAMUCIL) 58.6 % packet Take 1 packet by mouth daily.    . rizatriptan (MAXALT) 10 MG tablet Take 1 tablet (10 mg total) by mouth 3 (three) times daily as needed for migraine. May repeat in 2 hours if needed 10 tablet 3  . topiramate (TOPAMAX) 25 MG tablet Take one tablet at night for one week, then take 2 tablets at night for one week, then take 3 tablets at night. 90 tablet 3  . tretinoin (RETIN-A) 0.05 % cream APPLY 1 APPLICATION ON THE SKIN NIGHTLY FOR ACNE  3  . vitamin B-12 (CYANOCOBALAMIN) 500 MCG tablet Take 2 tablets (1,000 mcg total) by mouth daily.    . ziprasidone (GEODON) 80 MG capsule Take 240 mg by mouth at bedtime.      No facility-administered medications prior to visit.      Per HPI unless specifically indicated in ROS section below Review of Systems Objective:    BP 130/82 (BP Location: Left Arm, Patient Position: Sitting, Cuff Size: Normal)  Pulse 65   Temp 98 F (36.7 C) (Temporal)   Ht 5' 1.5" (1.562 m)   Wt 173 lb 5 oz (78.6 kg)   SpO2 97%   BMI 32.22 kg/m   Wt Readings from Last 3 Encounters:  12/10/18 173 lb 5 oz (78.6 kg)  10/12/18 174 lb (78.9 kg)  10/09/18 173 lb (78.5 kg)    Physical Exam Vitals signs and nursing note reviewed.  Constitutional:      General: She is not in acute distress.    Appearance: Normal appearance. She is not ill-appearing.  Musculoskeletal: Normal range of motion.        General: Tenderness present. No swelling.     Comments:  2+ rad pulses bilaterally L hand/wrist WNL R wrist - tender to palpation at scaphoid and into distal ventral forearm, FROM at wrist Neg seated SLR bilaterally. No pain with int/ext rotation at hip. Neg FABER. No pain at SIJ or sciatic notch bilaterally.  Painful palpation of R trochanteric bursitis  Neurological:     Mental Status: She is alert.     Motor:  Tremor (marked action tremor present) present.  Psychiatric:        Mood and Affect: Mood normal.        Behavior: Behavior normal.       Results for orders placed or performed in visit on 10/12/18  TSH  Result Value Ref Range   TSH 0.44 0.35 - 4.50 uIU/mL  T4, free  Result Value Ref Range   Free T4 0.55 (L) 0.60 - 1.60 ng/dL  T3, free  Result Value Ref Range   T3, Free 3.3 2.3 - 4.2 pg/mL   Lab Results  Component Value Date   ALT 12 08/09/2018   AST 16 08/09/2018   ALKPHOS 51 08/09/2018   BILITOT 0.3 08/09/2018   Assessment & Plan:  This visit occurred during the SARS-CoV-2 public health emergency.  Safety protocols were in place, including screening questions prior to the visit, additional usage of staff PPE, and extensive cleaning of exam room while observing appropriate contact time as indicated for disinfecting solutions.   Problem List Items Addressed This Visit    Tremor due to multiple drugs    Appreciate neurology care - tremor possibly worsened by lithium, possibly permanent. She will touch base with psych at next visit early 2021 about possible change in regimen.       Lateral pain of right hip    Not consistent with pain from hip osteoarthritis - anticipate trochanteric bursitis on right. provided with exercises to perform at home.        Hyperkalemia    Improved off ACEI.      Displaced fracture of proximal third of navicular bone of right wrist with delayed healing - Primary    Xray suspicious for R scaphoid fracture, poorly healing given fall happened about 1 month ago. Discussed concern with poor healing due to poor circulation to this area - will refer to ortho for further eval. Pt agrees with plan. rec stop ibuprofen,continue tylenol, start tylenol #3 PRN pain.  [place] CSRS reviewed      Relevant Orders   Ambulatory referral to Orthopedic Surgery   Bipolar disorder [facility])    Next psych appt is 01/2019       Other Visit Diagnoses    Injury of right  wrist, initial encounter       Relevant Orders   DG Wrist Complete Right       Meds  ordered this encounter  Medications  . acetaminophen-codeine (TYLENOL #3) 300-30 MG tablet    Sig: Take 1 tablet by mouth 3 (three) times daily as needed for moderate pain.    Dispense:  15 tablet    Refill:  0   Orders Placed This Encounter  Procedures  . DG Wrist Complete Right    Standing Status:   Future    Number of Occurrences:   1    Standing Expiration Date:   02/09/2020    Order Specific Question:   Reason for Exam (SYMPTOM  OR DIAGNOSIS REQUIRED)    Answer:   1 mo ago fall with residual ventral distal forearm and scaphoid pain    Order Specific Question:   Is patient pregnant?    Answer:   No    Order Specific Question:   Preferred imaging location?    Answer:   [facility]    Order Specific Question:   Radiology Contrast Protocol - do NOT remove file path    Answer:   [email or web address]  . Ambulatory referral to Orthopedic Surgery    Referral Priority:   Routine    Referral Type:   Surgical    Referral Reason:   Specialty Services Required    Requested Specialty:   Orthopedic Surgery    Number of Visits Requested:   1    Patient instructions: Hopeful to come off lithium per neurology recs (?cause of worsening tremor) I think you have R hip bursitis - try exercises provided today Xray of R wrist - showing as suspected scaphoid fracture - I would like to refer you to orthopedist for further evaluation.  May take tylenol #3 with 1 tylenol 500mg  three times daily as needed.  Stop ibuprofen if you can.   Follow up plan: Return if symptoms worsen or fail to improve.  Ria Bush, MD

## 2018-12-10 NOTE — Assessment & Plan Note (Signed)
Appreciate neurology care - tremor possibly worsened by lithium, possibly permanent. She will touch base with psych at next visit early 2021 about possible change in regimen.

## 2018-12-10 NOTE — Patient Instructions (Addendum)
Hopeful to come off lithium per neurology recs (?cause of worsening tremor) I think you have R hip bursitis - try exercises provided today Xray of R wrist - showing as suspected scaphoid fracture - I would like to refer you to orthopedist for further evaluation.  May take tylenol #3 with 1 tylenol 500mg  three times daily as needed.  Stop ibuprofen if you can.   Scaphoid Fracture  A scaphoid fracture is a break in one of the small bones of the wrist. The scaphoid bone is located on the thumb side of the wrist. It supports the other seven bones that make up the wrist. The scaphoid bone has a poor blood supply, so it can take a long time to heal. You may need to wear a cast or splint for several months. What are the causes? This injury is usually caused by a fall onto an outstretched hand and arm. This type of injury may also occur if you are in a motor vehicle accident and you brace yourself with your hand. What increases the risk? You are more likely to develop this condition if you:  Play contact sports.  Participate in activities such as skiing, skating, or rollerblading. What are the signs or symptoms? Symptoms of this condition include:  Pain, especially when grasping or pinching with your thumb.  Pain when pressing on the base of your thumb, especially in the hollow area at the base of your thumb when your thumb is extended outward.  Swelling.  Bruising. How is this diagnosed? This condition may be diagnosed based on:  Your history of injury.  A physical exam of your wrist and thumb.  X-rays.  A CT scan or an MRI. A scaphoid fracture may be hard to diagnose. This is because pain may not start for a few days, the fracture does not cause a deformity, and the fracture may not limit movement. How is this treated? Treatment depends on the location of the fracture and whether the bone is out of place (displaced). Treatment may be surgical or nonsurgical. It may  include:  Wearing a cast or splint from the middle of your forearm down to your wrist. Your thumb may be extended out and included in the cast or splint.  Receiving treatment with sound waves or electrical energy that stimulates healing (bone stimulator).  Having surgery. A displaced fracture may require surgery to put the pieces of bone back in the proper position. Screws or wires may be used to hold the bone in place and a cast or splint might be applied. You may need to do exercises to help you regain wrist movement (physical therapy) after your cast or splint is removed. Follow these instructions at home: If you have a cast:  Do not put pressure on any part of the cast until it is fully hardened. This may take several hours.  Do not stick anything inside the cast to scratch your skin. Doing that increases your risk of infection.  You may put lotion on dry skin around the edges of the cast. Do not put lotion on the skin underneath the cast. If you have a splint:  Wear the splint as told by your health care provider. Remove it only as told by your health care provider.  Loosen the splint if your fingers tingle, become numb, or turn cold and blue. If you have a cast or a splint:  If the splint or cast is not waterproof: ? Do not let it get wet. ?  Cover it with a watertight covering when you take a bath or shower.  Check the skin around it every day. Tell your health care provider about any concerns.  Keep it clean and dry. Managing pain, stiffness, and swelling   If directed, put ice on the injured area. ? Put ice in a plastic bag. ? Place a towel between your skin and the bag. ? Leave the ice on for 20 minutes, 2-3 times a day.  Move your fingers often to reduce stiffness and swelling.  Raise (elevate) the injured area above the level of your heart while you are sitting or lying down. Medicines  Ask your health care provider if the medicine prescribed to you: ? Requires  you to avoid driving or using heavy machinery. ? Can cause constipation. You may need to take these actions to prevent or treat constipation, such as:  Drink enough fluid to keep your urine pale yellow.  Take over-the-counter or prescription medicines.  Eat foods that are high in fiber, such as beans, whole grains, and fresh fruits and vegetables.  Limit foods that are high in fat and processed sugars, such as fried or sweet foods. Activity  Ask your health care provider when it is safe to drive if you have a cast or splint on your hand.  Return to your normal activities as told by your health care provider. Ask your health care provider what activities are safe for you. You may need to limit activities such as contact sports, throwing, pushing, and climbing.  Do not lift anything that is heavier than 1 lb (0.5 kg), or the limit that you are told, with the affected hand until your health care provider says that it is safe.  Do physical therapy as told by your health care provider. General instructions  Take over-the-counter and prescription medicines only as told by your health care provider.  Do not use any products that contain nicotine or tobacco, such as cigarettes, e-cigarettes, and chewing tobacco. These can delay bone healing. If you need help quitting, ask your health care provider.  Keep all follow-up visits as told by your health care provider. This is important. Contact a health care provider if:  Your pain or swelling gets worse.  You have pain, numbness, or coldness in your hand or fingers.  Your cast or splint becomes loose or damaged. Get help right away if:  You lose feeling in your hand or fingers.  Your fingers or fingernails turn pale or blue. Summary  A scaphoid fracture is a break in one of the small bones of the wrist.  This injury is usually caused by a fall onto an outstretched hand and arm.  Symptoms of this injury are pain and swelling of the  hand.  Treatment depends on the location of the fracture and whether the bone is out of place (displaced). Treatment may be surgical or nonsurgical.  You may need a cast or splint from the middle of your forearm down to your wrist. You may need to wear this for several months. This information is not intended to replace advice given to you by your health care provider. Make sure you discuss any questions you have with your health care provider. Document Released: 12/17/2001 Document Revised: 01/05/2018 Document Reviewed: 01/05/2018 Elsevier Patient Education  2020 [employer].

## 2018-12-10 NOTE — Assessment & Plan Note (Addendum)
Xray suspicious for R scaphoid fracture, poorly healing given fall happened about 1 month ago. Discussed concern with poor healing due to poor circulation to this area - will refer to ortho for further eval. Pt agrees with plan. rec stop ibuprofen,continue tylenol, start tylenol #3 PRN pain.  [place] CSRS reviewed

## 2018-12-10 NOTE — Assessment & Plan Note (Signed)
Not consistent with pain from hip osteoarthritis - anticipate trochanteric bursitis on right. provided with exercises to perform at home.

## 2018-12-10 NOTE — Assessment & Plan Note (Signed)
Next psych appt is 01/2019

## 2018-12-10 NOTE — Assessment & Plan Note (Signed)
Improved off ACEI.

## 2018-12-30 ENCOUNTER — Other Ambulatory Visit: Payer: Self-pay | Admitting: Neurology

## 2019-02-15 ENCOUNTER — Other Ambulatory Visit: Payer: Self-pay

## 2019-02-15 ENCOUNTER — Encounter: Payer: Self-pay | Admitting: Family Medicine

## 2019-02-15 ENCOUNTER — Ambulatory Visit (INDEPENDENT_AMBULATORY_CARE_PROVIDER_SITE_OTHER): Payer: 59 | Admitting: Family Medicine

## 2019-02-15 VITALS — BP 140/100 | HR 60 | Temp 97.6°F | Ht 61.5 in | Wt 178.2 lb

## 2019-02-15 DIAGNOSIS — N289 Disorder of kidney and ureter, unspecified: Secondary | ICD-10-CM | POA: Diagnosis not present

## 2019-02-15 DIAGNOSIS — I10 Essential (primary) hypertension: Secondary | ICD-10-CM

## 2019-02-15 DIAGNOSIS — F3170 Bipolar disorder, currently in remission, most recent episode unspecified: Secondary | ICD-10-CM

## 2019-02-15 DIAGNOSIS — G251 Drug-induced tremor: Secondary | ICD-10-CM | POA: Diagnosis not present

## 2019-02-15 DIAGNOSIS — M25551 Pain in right hip: Secondary | ICD-10-CM

## 2019-02-15 LAB — BASIC METABOLIC PANEL
BUN: 16 mg/dL (ref 6–23)
CO2: 27 mEq/L (ref 19–32)
Calcium: 9.9 mg/dL (ref 8.4–10.5)
Chloride: 102 mEq/L (ref 96–112)
Creatinine, Ser: 0.97 mg/dL (ref 0.40–1.20)
GFR: 58.98 mL/min — ABNORMAL LOW (ref >60.00)
Glucose, Bld: 91 mg/dL (ref 70–99)
Potassium: 4.9 mEq/L (ref 3.5–5.1)
Sodium: 135 mEq/L (ref 135–145)

## 2019-02-15 MED ORDER — POLYETHYLENE GLYCOL 3350 17 GM/SCOOP PO POWD: 17.0000 g | Freq: Every day | ORAL | 1 refills | Status: DC | PRN

## 2019-02-15 MED ORDER — METAMUCIL SMOOTH TEXTURE 58.6 % PO POWD: 1.0000 | Freq: Every day | ORAL | Status: DC

## 2019-02-15 MED ORDER — VITAMELTS ENERGY VITAMIN B-12 1500 MCG PO TBDP: 1.0000 | ORAL_TABLET | ORAL | Status: AC

## 2019-02-15 MED ORDER — AMLODIPINE BESYLATE 5 MG PO TABS: 5.0000 mg | ORAL_TABLET | Freq: Every day | ORAL | 11 refills | Status: DC

## 2019-02-15 MED ORDER — FERROUS SULFATE 324 (65 FE) MG PO TBEC: 1.0000 | DELAYED_RELEASE_TABLET | Freq: Every day | ORAL | Status: DC

## 2019-02-15 NOTE — Patient Instructions (Addendum)
Verify you're off lisinopril (it's still on your medicine list).  Blood pressure is staying high today - start new blood pressure medicine amlodipine 5mg  daily - watch for ankle swelling on this medicine.  Labs today.  Return to see Dr Lorelei Pont for right hip pain after fall.

## 2019-02-15 NOTE — Assessment & Plan Note (Signed)
Update BMP. Improved off ACEI

## 2019-02-15 NOTE — Progress Notes (Signed)
This visit was conducted in person.  BP (!) 140/100 (BP Location: Right Arm, Patient Position: Sitting, Cuff Size: Normal)   Pulse 60   Temp 97.6 F (36.4 C) (Temporal)   Ht 5' 1.5" (1.562 m)   Wt 178 lb 3 oz (80.8 kg)   SpO2 98%   BMI 33.12 kg/m    CC: 6 mo f/u visit Subjective:    Patient ID: Katelyn Price, female    DOB: 06-04-1960, 59 y.o.   MRN: [id number]  HPI: Katelyn Price is a 59 y.o. female presenting on 02/15/2019 for Follow-up (Here for 6 mo f/u.)   HTN - no recent need for BP meds. Lisinopril 10mg  was stopped 08/2018 - due to elevated K and Cr. Does not check blood pressures at home. No low blood pressure readings or symptoms of dizziness/syncope. Denies vision changes, CP/tightness, SOB, leg swelling.  Occasional HA.   Ongoing R lateral hip pain since 11/2018 - after fall at home. Previous eval consistent with hip bursitis - exercises didn't really help (had trouble doing them).   She continues lithium but lower dose.      Relevant past medical, surgical, family and social history reviewed and updated as indicated. Interim medical history since our last visit reviewed. Allergies and medications reviewed and updated. Outpatient Medications Prior to Visit  Medication Sig Dispense Refill  . buPROPion (WELLBUTRIN XL) 150 MG 24 hr tablet Take 2 tablets (300 mg total) by mouth daily.    . busPIRone (BUSPAR) 15 MG tablet Take 7.5 mg by mouth 3 (three) times daily.     . Ca & Phos-Vit D-Mag (CALCIUM) [phone] TABS Take by mouth.    . Cholecalciferol (VITAMIN D3) 125 MCG (5000 UT) CAPS Take 1 capsule by mouth daily.    Marland Kitchen estradiol (ESTRACE) 2 MG tablet Take 2 mg by mouth daily.    Marland Kitchen lithium carbonate (ESKALITH) 450 MG CR tablet Take 1 tablet by mouth at bedtime.    . methimazole (TAPAZOLE) 5 MG tablet Take 1/2 tablet daily by mouth 60 tablet 5  . pantoprazole (PROTONIX) 40 MG tablet TAKE 1 TABLET BY MOUTH EVERY DAY 90 tablet 3  . progesterone (PROMETRIUM) 200  MG capsule Take 1 capsule (200 mg total) by mouth at bedtime.    . propranolol ER (INDERAL LA) 120 MG 24 hr capsule Take by mouth daily.    . ziprasidone (GEODON) 80 MG capsule Take 240 mg by mouth at bedtime.     Marland Kitchen buPROPion (WELLBUTRIN XL) 150 MG 24 hr tablet Take 150 mg by mouth daily.     . citalopram (CELEXA) 10 MG tablet Take 20 mg by mouth daily.     . vitamin B-12 (CYANOCOBALAMIN) 500 MCG tablet Take 2 tablets (1,000 mcg total) by mouth daily.    . citalopram (CELEXA) 20 MG tablet Take 1 tablet (20 mg total) by mouth daily.    Marland Kitchen acetaminophen-codeine (TYLENOL #3) 300-30 MG tablet Take 1 tablet by mouth 3 (three) times daily as needed for moderate pain. 15 tablet 0  . Cholecalciferol (VITAMIN D) 2000 UNITS CAPS Take 1 capsule by mouth daily.     Marland Kitchen lithium carbonate (LITHOBID) 300 MG CR tablet Take 300 mg by mouth 2 (two) times daily.    . polyethylene glycol (MIRALAX / GLYCOLAX) packet Take 17 g by mouth daily.    . psyllium (METAMUCIL) 58.6 % packet Take 1 packet by mouth daily.    . rizatriptan (MAXALT) 10 MG tablet  Take 1 tablet (10 mg total) by mouth 3 (three) times daily as needed for migraine. May repeat in 2 hours if needed 10 tablet 3  . topiramate (TOPAMAX) 25 MG tablet take 3 tablets at night. 270 tablet 1  . tretinoin (RETIN-A) 0.05 % cream APPLY 1 APPLICATION ON THE SKIN NIGHTLY FOR ACNE  3   No facility-administered medications prior to visit.     Per HPI unless specifically indicated in ROS section below Review of Systems Objective:    BP (!) 140/100 (BP Location: Right Arm, Patient Position: Sitting, Cuff Size: Normal)   Pulse 60   Temp 97.6 F (36.4 C) (Temporal)   Ht 5' 1.5" (1.562 m)   Wt 178 lb 3 oz (80.8 kg)   SpO2 98%   BMI 33.12 kg/m   Wt Readings from Last 3 Encounters:  02/15/19 178 lb 3 oz (80.8 kg)  12/10/18 173 lb 5 oz (78.6 kg)  10/12/18 174 lb (78.9 kg)    Physical Exam Vitals and nursing note reviewed.  Constitutional:      Appearance:  Normal appearance. She is obese. She is not ill-appearing.  Cardiovascular:     Rate and Rhythm: Normal rate and regular rhythm.     Pulses: Normal pulses.     Heart sounds: Normal heart sounds. No murmur.  Pulmonary:     Effort: Pulmonary effort is normal. No respiratory distress.     Breath sounds: Normal breath sounds. No wheezing, rhonchi or rales.  Musculoskeletal:     Right lower leg: No edema.     Left lower leg: No edema.     Comments:  No pain with int/ext rotation of R leg at groin Reproducible tenderness to palpation R trochanteric bursa  Neurological:     Mental Status: She is alert.     Comments: Chronic tremor present  Psychiatric:        Mood and Affect: Mood normal.        Behavior: Behavior normal.       Results for orders placed or performed in visit on 10/12/18  TSH  Result Value Ref Range   TSH 0.44 0.35 - 4.50 uIU/mL  T4, free  Result Value Ref Range   Free T4 0.55 (L) 0.60 - 1.60 ng/dL  T3, free  Result Value Ref Range   T3, Free 3.3 2.3 - 4.2 pg/mL   Assessment & Plan:  This visit occurred during the SARS-CoV-2 public health emergency.  Safety protocols were in place, including screening questions prior to the visit, additional usage of staff PPE, and extensive cleaning of exam room while observing appropriate contact time as indicated for disinfecting solutions.   Problem List Items Addressed This Visit    Tremor due to multiple drugs   Renal insufficiency    Update BMP. Improved off ACEI      Lateral pain of right hip    Anticipate bursitis - will ask Dr Lorelei Pont to further evaluate/treat.       HYPERTENSION, BENIGN ESSENTIAL - Primary    Chronic, deteriorated off lisinopril. Will start amlodipine. Check BMP today.       Relevant Medications   amLODipine (NORVASC) 5 MG tablet   Other Relevant Orders   Basic metabolic panel   Bipolar disorder ([place])    Sees Dr Josephine Igo           Meds ordered this encounter  Medications  .  ferrous sulfate 324 (65 Fe) MG TBEC    Sig: Take 1  tablet (325 mg total) by mouth daily.    Dispense:  30 tablet  . polyethylene glycol powder (GLYCOLAX/MIRALAX) 17 GM/SCOOP powder    Sig: Take 17 g by mouth daily as needed for moderate constipation.    Dispense:  3350 g    Refill:  1  . psyllium (METAMUCIL SMOOTH TEXTURE) 58.6 % powder    Sig: Take 1 packet by mouth daily.  . Cyanocobalamin (VITAMELTS ENERGY VITAMIN B-12) 1500 MCG TBDP    Sig: Take 1 tablet by mouth every Monday, Wednesday, and Friday.  Marland Kitchen amLODipine (NORVASC) 5 MG tablet    Sig: Take 1 tablet (5 mg total) by mouth daily.    Dispense:  30 tablet    Refill:  11   Orders Placed This Encounter  Procedures  . Basic metabolic panel    Patient Instructions  Verify you're off lisinopril (it's still on your medicine list).  Blood pressure is staying high today - start new blood pressure medicine amlodipine 5mg  daily - watch for ankle swelling on this medicine.  Labs today.  Return to see Dr Lorelei Pont for right hip pain after fall.    Follow up plan: Return if symptoms worsen or fail to improve.  Ria Bush, MD

## 2019-02-15 NOTE — Assessment & Plan Note (Signed)
Sees Dr Josephine Igo

## 2019-02-15 NOTE — Assessment & Plan Note (Signed)
Chronic, deteriorated off lisinopril. Will start amlodipine. Check BMP today.

## 2019-02-15 NOTE — Assessment & Plan Note (Signed)
Anticipate bursitis - will ask Dr Lorelei Pont to further evaluate/treat.

## 2019-02-17 NOTE — Progress Notes (Deleted)
     Makayla Confer T. Aliciana Ricciardi, MD Primary Care and Sports Medicine [facility] [place], [address] Phone: [phone]  FAX: [place] - 59 y.o. female  MRN [id number]  Date of Birth: February 10, 1960  Visit Date: 02/18/2019  PCP: Ria Bush, MD  Referred by: Ria Bush, MD  No chief complaint on file.   This visit occurred during the SARS-CoV-2 public health emergency.  Safety protocols were in place, including screening questions prior to the visit, additional usage of staff PPE, and extensive cleaning of exam room while observing appropriate contact time as indicated for disinfecting solutions.   Subjective:   RAFA DOMOND is a 59 y.o. very pleasant female patient with There is no height or weight on file to calculate BMI. who presents with the following:  She is a very pleasant lady and she presents today with some ongoing hip pain.  This is a right-sided hip pain.  She had a fall in November 2020 and had some intermittent right-sided lateral hip pain since then.  Review of Systems is noted in the HPI, as appropriate   Objective:   There were no vitals taken for this visit.    GEN: No acute distress; alert,appropriate. PULM: Breathing comfortably in no respiratory distress PSYCH: Normally interactive.   HIP EXAM: SIDE: *** ROM: Abduction, Flexion, Internal and External range of motion: *** Pain with terminal IROM and EROM: *** GTB: NT SLR: NEG Knees: No effusion FABER: NT REVERSE FABER: NT, neg Piriformis: NT at direct palpation Str: flexion: 5/5 abduction: 5/5 adduction: 5/5 Strength testing non-tender     Radiology: No results found.  Assessment and Plan:   ***

## 2019-02-18 ENCOUNTER — Ambulatory Visit: Payer: 59 | Admitting: Family Medicine

## 2019-02-18 DIAGNOSIS — Z0289 Encounter for other administrative examinations: Secondary | ICD-10-CM

## 2019-02-22 ENCOUNTER — Telehealth: Payer: Self-pay | Admitting: Family Medicine

## 2019-02-22 NOTE — Telephone Encounter (Signed)
Patient called and said she had an appointment scheduled with Dr.Copland on 02/18/19.  Patient said she called on 02/16/19 to cancel the appointment. Patient said she wanted to make sure she wouldn't be charged for a no show.

## 2019-02-27 NOTE — Telephone Encounter (Signed)
Email received from billing as 2/17 no charge is showing on the patient's account for no show fee.

## 2019-03-10 NOTE — Progress Notes (Addendum)
Elton Heid T. Mysty Kielty, MD Primary Care and Sports Medicine [facility] at [facility] [place], [address] Phone: [phone]  FAX: [place] - 59 y.o. female  MRN [id number]  Date of Birth: Apr 10, 1960  Visit Date: 03/11/2019  PCP: Ria Bush, MD  Referred by: Ria Bush, MD  Chief Complaint  Patient presents with  . Hip Pain    Right    This visit occurred during the SARS-CoV-2 public health emergency.  Safety protocols were in place, including screening questions prior to the visit, additional usage of staff PPE, and extensive cleaning of exam room while observing appropriate contact time as indicated for disinfecting solutions.   Subjective:   Katelyn Price is a 59 y.o. very pleasant female patient who presents with the following:  Right-sided lateral hip pain after traumatic fall in late of 2020.  She also describes pain in the groin itself on the right.  She denies having any prior significant hip pain, back pain, knee pain on the right side.  She was able to walk after her initial injury.  She is having some continued ongoing pain as described above.  She is not having any radicular symptoms.  Pain is concentrated at the right hip itself.  [place] down the step - started after fall in 7 - 08/2019  She describes some lateral hip pain on the right after her fall.   Prior to her injury in the fall, she tells me that she did not have any significant hip pain.  She denies any significant back pain or radicular symptoms.  Primarily she is having groin pain and to a lesser extent laterally.  Review of Systems is noted in the HPI, as appropriate  Objective:   BP 110/84   Pulse 80   Temp 98.7 F (37.1 C) (Temporal)   Ht 5' 1.5" (1.562 m)   Wt 180 lb 8 oz (81.9 kg)   SpO2 96%   BMI 33.55 kg/m   GEN: No acute distress; alert,appropriate. PULM: Breathing comfortably in no respiratory distress PSYCH:  Normally interactive.   At the hip the patient has pain with flexion to 80 degrees.  Rotational movements also cause pain.  She also has some significant right-sided trochanteric bursitis that is tender to palpation.  Strength in all directions is 5/5.  Otherwise she is neurovascularly intact.  Laboratory and Imaging Data: DG FLUORO GUIDED NEEDLE [facility] ASPIRATION/INJECTION LOC  Result Date: 03/14/2019 CLINICAL DATA:  Chronic right hip pain. FLUOROSCOPY TIME:  Radiation Exposure Index (as provided by the fluoroscopic device): 1.0 mGy Fluoroscopy Time:  12 seconds Number of Acquired Images:  0 PROCEDURE: The risks and benefits of the procedure were discussed with the patient, and written informed consent was obtained. The patient stated no history of allergy to contrast media. A formal timeout procedure was performed with the patient according to departmental protocol. The patient was placed supine on the fluoroscopy table and the right hip joint was identified under fluoroscopy. The skin overlying the right hip joint was subsequently cleaned with Betadine and a sterile drape was placed over the area of interest. 2 ml 1% Lidocaine was used to anesthetize the skin around the needle insertion site. A 22 gauge spinal needle was inserted into the right hip joint under fluoroscopy. Diagnostic injection of 0.5 ml Isovue-M 200 iodinated contrast demonstrates intra-articular spread without intravascular component. 120mg  Depo-Medrol and 5 ml Sensorcaine 0.25% were then administered into the  right hip joint. The needle was removed and hemostasis was achieved. There were no immediate complications. IMPRESSION: Technically successful right hip steroid injection. Electronically Signed   By: Titus Dubin M.D.   On: 03/14/2019 14:37   DG HIP UNILAT WITH PELVIS 2-3 VIEWS RIGHT  Result Date: 03/11/2019 CLINICAL DATA:  59 year old female with right hip lateral pain. Trauma 6 weeks ago. EXAM: DG HIP (WITH OR WITHOUT PELVIS)  2-3V RIGHT COMPARISON:  None. FINDINGS: Weightbearing views. There is moderate to severe right hip joint space loss, subchondral sclerosis, and osteophytosis. The contralateral left hip joint space appears preserved. The proximal right femur appears intact. Pelvis appears intact. No acute osseous abnormality identified. SI joints appear normal. Negative lower abdominal and pelvic visceral contours. IMPRESSION: Moderate to severe right hip osteoarthritis with no acute osseous abnormality identified. Electronically Signed   By: Genevie Ann M.D.   On: 03/11/2019 11:16     Assessment and Plan:     ICD-10-CM   1. Arthritis of right hip  M16.11   2. Lateral pain of right hip  M25.551 DG HIP UNILAT WITH PELVIS 2-3 VIEWS RIGHT  3. Greater trochanteric bursitis, right  M70.61   4. Lithium use  Z79.899   5. Primary localized osteoarthritis of hip  M16.10 DG FLUORO GUIDED NEEDLE [facility] ASPIRATION/INJECTION LOC   Level of Medical Decision-Making in this case is MODERATE.   Use of lithium is a relative contraindication to NSAIDs given that this will increase the patient's lithium blood level  The primary issue here is that the patient has advanced osteoarthritis of the right hip.  On my estimation on plain film, the patient has severe osteoarthritis of the right hip.  Per her report she was asymptomatic prior to her traumatic fall.  She does have some trochanteric bursitis, but this is significantly less important than her hip arthritis.  This is likely all secondary.  There is no flattening of the femoral head, but I do not think that avascular necrosis can fully be eliminated.  We talked about options including getting an MRI right now versus diagnostic and therapeutic injection of corticosteroid.  At this time, she and I both think it is a reasonable trial to do an injection with steroids.  We will arrange for fluoroscopy guidance.  Follow-up with me after this.  Addendum, 4:42 PM 03/18/19  Pain minimally  improved with fluoro guided hip inj.  THA is reasonable next step, but she is not ready.  Tramadol with lithium interaction.  Tylenol #3 is sent to pharmacy, #20 tabs. Electronically Signed  By: Owens Loffler, MD On: 03/18/2019 4:43 PM   Follow-up: No follow-ups on file.  Meds ordered this encounter  Medications  . acetaminophen-codeine (TYLENOL #3) 300-30 MG tablet    Sig: Take 1 tablet by mouth every 4 (four) hours as needed for moderate pain.    Dispense:  20 tablet    Refill:  0   There are no discontinued medications. Orders Placed This Encounter  Procedures  . DG HIP UNILAT WITH PELVIS 2-3 VIEWS RIGHT  . DG FLUORO GUIDED NEEDLE PLC ASPIRATION/INJECTION LOC    Signed,  Aliviyah Malanga T. Vernice Mannina, MD   Outpatient Encounter Medications as of 03/11/2019  Medication Sig  . amLODipine (NORVASC) 5 MG tablet Take 1 tablet (5 mg total) by mouth daily.  Marland Kitchen buPROPion (WELLBUTRIN XL) 150 MG 24 hr tablet Take 2 tablets (300 mg total) by mouth daily.  . busPIRone (BUSPAR) 15 MG tablet Take 7.5 mg by mouth  3 (three) times daily.   . Ca & Phos-Vit D-Mag (CALCIUM) [phone] TABS Take by mouth.  . Cholecalciferol (VITAMIN D3) 125 MCG (5000 UT) CAPS Take 1 capsule by mouth daily.  . citalopram (CELEXA) 20 MG tablet Take 1 tablet (20 mg total) by mouth daily.  . Cyanocobalamin (VITAMELTS ENERGY VITAMIN B-12) 1500 MCG TBDP Take 1 tablet by mouth every Monday, Wednesday, and Friday.  . estradiol (ESTRACE) 2 MG tablet Take 2 mg by mouth daily.  . ferrous sulfate 324 (65 Fe) MG TBEC Take 1 tablet (325 mg total) by mouth daily.  Marland Kitchen lithium carbonate (ESKALITH) 450 MG CR tablet Take 1 tablet by mouth at bedtime.  . methimazole (TAPAZOLE) 5 MG tablet Take 1/2 tablet daily by mouth  . pantoprazole (PROTONIX) 40 MG tablet TAKE 1 TABLET BY MOUTH EVERY DAY  . polyethylene glycol powder (GLYCOLAX/MIRALAX) 17 GM/SCOOP powder Take 17 g by mouth daily as needed for moderate constipation.  . progesterone  (PROMETRIUM) 200 MG capsule Take 1 capsule (200 mg total) by mouth at bedtime.  . propranolol ER (INDERAL LA) 120 MG 24 hr capsule Take by mouth daily.  . psyllium (METAMUCIL SMOOTH TEXTURE) 58.6 % powder Take 1 packet by mouth daily.  . ziprasidone (GEODON) 80 MG capsule Take 240 mg by mouth at bedtime.   Marland Kitchen acetaminophen-codeine (TYLENOL #3) 300-30 MG tablet Take 1 tablet by mouth every 4 (four) hours as needed for moderate pain.   No facility-administered encounter medications on file as of 03/11/2019.

## 2019-03-11 ENCOUNTER — Encounter: Payer: Self-pay | Admitting: Family Medicine

## 2019-03-11 ENCOUNTER — Other Ambulatory Visit: Payer: Self-pay

## 2019-03-11 ENCOUNTER — Ambulatory Visit (INDEPENDENT_AMBULATORY_CARE_PROVIDER_SITE_OTHER)
Admission: RE | Admit: 2019-03-11 | Discharge: 2019-03-11 | Disposition: A | Discharge status: Home / Self Care | Payer: 59 | Source: Ambulatory Visit | Attending: Family Medicine | Admitting: Family Medicine

## 2019-03-11 ENCOUNTER — Ambulatory Visit (INDEPENDENT_AMBULATORY_CARE_PROVIDER_SITE_OTHER): Payer: 59 | Admitting: Family Medicine

## 2019-03-11 VITALS — BP 110/84 | HR 80 | Temp 98.7°F | Ht 61.5 in | Wt 180.5 lb

## 2019-03-11 DIAGNOSIS — M1611 Unilateral primary osteoarthritis, right hip: Secondary | ICD-10-CM

## 2019-03-11 DIAGNOSIS — M25551 Pain in right hip: Secondary | ICD-10-CM

## 2019-03-11 DIAGNOSIS — M7061 Trochanteric bursitis, right hip: Secondary | ICD-10-CM | POA: Diagnosis not present

## 2019-03-11 DIAGNOSIS — Z79899 Other long term (current) drug therapy: Secondary | ICD-10-CM | POA: Diagnosis not present

## 2019-03-11 DIAGNOSIS — M1610 Unilateral primary osteoarthritis, unspecified hip: Secondary | ICD-10-CM

## 2019-03-11 NOTE — Patient Instructions (Signed)
 Imaging on [address]  Beside the [employer] [address] [employer] station just before [place]  It is on the LEFT coming from [place] to [place].

## 2019-03-12 ENCOUNTER — Telehealth: Payer: Self-pay | Admitting: Family Medicine

## 2019-03-12 NOTE — Telephone Encounter (Signed)
Patient was in to see Dr Lorelei Pont yesterday (3/1) She stated she is suppose to have a Cortisone shot and has not heard from anyone about this. She wanted to know when she needed to get this scheduled.  Please advise  Patient stated that if she does not answer to please leave information on her voicemail

## 2019-03-12 NOTE — Telephone Encounter (Signed)
Called [facility] and left message for Angelita Ingles, will call patient today.

## 2019-03-14 ENCOUNTER — Other Ambulatory Visit: Payer: Self-pay

## 2019-03-14 ENCOUNTER — Ambulatory Visit
Admission: RE | Admit: 2019-03-14 | Discharge: 2019-03-14 | Disposition: A | Discharge status: Home / Self Care | Payer: 59 | Source: Ambulatory Visit | Attending: Family Medicine | Admitting: Family Medicine

## 2019-03-14 DIAGNOSIS — M1610 Unilateral primary osteoarthritis, unspecified hip: Secondary | ICD-10-CM

## 2019-03-14 MED ORDER — IOPAMIDOL (ISOVUE-M 200) INJECTION 41%
1.0000 mL | Freq: Once | INTRAMUSCULAR | Status: AC
  Administered 2019-03-14: 1 mL via INTRA_ARTICULAR

## 2019-03-14 MED ORDER — METHYLPREDNISOLONE ACETATE 40 MG/ML INJ SUSP (RADIOLOG
120.0000 mg | Freq: Once | INTRAMUSCULAR | Status: AC
  Administered 2019-03-14: 120 mg via INTRA_ARTICULAR

## 2019-03-14 NOTE — Telephone Encounter (Signed)
It is because it is going to be done with fluoro (xray) with radiology.   Deep hip injections need to be done with imaging for safety and accuracy - to avoid major arteries and nerves.

## 2019-03-14 NOTE — Telephone Encounter (Signed)
Patient called back.  I spoke to [place].  I advised patient the cortisone shot has to be administered with ultrasound and that's why Dr.Copland referred her to [place].  It can't be done at our office.

## 2019-03-14 NOTE — Telephone Encounter (Signed)
Left message for Katelyn Price that the reason she has to get her injection done at [place] is because it is going to be done with fluoro (xray) with radiology.   Deep hip injections need to be done with imaging for safety and accuracy - to avoid major arteries and nerves.  I ask that she call us back if she has any further questions.

## 2019-03-14 NOTE — Telephone Encounter (Signed)
Patient called back She stated she would like to know why Dr Lorelei Pont was not able to give her the Cortisone shot.   Patient requested call back

## 2019-03-18 MED ORDER — ACETAMINOPHEN-CODEINE #3 300-30 MG PO TABS: 1.0000 | ORAL_TABLET | ORAL | 0 refills | Status: DC | PRN

## 2019-03-18 NOTE — Telephone Encounter (Signed)
See ov addendum

## 2019-03-18 NOTE — Telephone Encounter (Signed)
Patient is requesting a call back She stated she went to [facility] and had the cortisone shot done on Tuesday. Patient said she is till having pain every day.

## 2019-03-18 NOTE — Addendum Note (Signed)
Addended by: Owens Loffler on: 03/18/2019 04:43 PM   Modules accepted: Orders

## 2019-03-28 ENCOUNTER — Other Ambulatory Visit: Payer: Self-pay | Admitting: Internal Medicine

## 2019-03-28 ENCOUNTER — Other Ambulatory Visit: Payer: Self-pay | Admitting: Family Medicine

## 2019-03-28 NOTE — Telephone Encounter (Signed)
Last office visit 03/01/2021with Dr. Lorelei Pont for Arthritis of Right Hip.  Last refilled 03/18/2019 for #20 with no refills by Dr. Lorelei Pont.  CPE scheduled for 08/19/2019.

## 2019-03-28 NOTE — Addendum Note (Signed)
Addended by: Carter Kitten on: 03/28/2019 11:34 AM   Modules accepted: Orders

## 2019-03-28 NOTE — Telephone Encounter (Signed)
Spoke with pt notifying her refill was sent.  Also asked about right hip pain.  Says it's better, especially after the cortisone shot.  But the bursitis started mildly bother her again.

## 2019-03-28 NOTE — Telephone Encounter (Signed)
ERx Tylenol#3 refilled. plz call patient to get update on hip pain.  Will cc Dr Lorelei Pont as fyi.

## 2019-03-28 NOTE — Telephone Encounter (Signed)
Name of Medication: Tylenol #3 Name of Pharmacy: [facility] Last Fill or Written Date and Quantity: 03/18/19, #20 Last Office Visit and Type: 03/11/19, R hip arthritis- Dr. Lorelei Pont Next Office Visit and Type: 08/19/19, AWV prt 2- Dr. Darnell Level Last Controlled Substance Agreement Date: none Last UDS: none

## 2019-03-28 NOTE — Telephone Encounter (Signed)
Refilled by Dr. Danise Mina.

## 2019-03-28 NOTE — Telephone Encounter (Signed)
Pt needs refill on  Acetaminophen Pt has 1 pill left [facility]

## 2019-04-12 ENCOUNTER — Ambulatory Visit: Payer: 59 | Admitting: Internal Medicine

## 2019-04-18 ENCOUNTER — Other Ambulatory Visit: Payer: Self-pay

## 2019-04-22 ENCOUNTER — Encounter: Payer: Self-pay | Admitting: Internal Medicine

## 2019-04-22 ENCOUNTER — Ambulatory Visit (INDEPENDENT_AMBULATORY_CARE_PROVIDER_SITE_OTHER): Payer: 59 | Admitting: Internal Medicine

## 2019-04-22 ENCOUNTER — Other Ambulatory Visit: Payer: Self-pay

## 2019-04-22 VITALS — BP 118/80 | HR 65 | Ht 61.5 in | Wt 178.0 lb

## 2019-04-22 DIAGNOSIS — E0500 Thyrotoxicosis with diffuse goiter without thyrotoxic crisis or storm: Secondary | ICD-10-CM | POA: Diagnosis not present

## 2019-04-22 DIAGNOSIS — E042 Nontoxic multinodular goiter: Secondary | ICD-10-CM

## 2019-04-22 LAB — T3, FREE: T3, Free: 3.4 pg/mL (ref 2.3–4.2)

## 2019-04-22 LAB — T4, FREE: Free T4: 0.51 ng/dL — ABNORMAL LOW (ref 0.60–1.60)

## 2019-04-22 LAB — TSH: TSH: 0.54 u[IU]/mL (ref 0.35–4.50)

## 2019-04-22 NOTE — Progress Notes (Signed)
Patient ID: Katelyn Price, female   DOB: 23-Sep-1960, 59 y.o.   MRN: [id number]   This visit occurred during the SARS-CoV-2 public health emergency.  Safety protocols were in place, including screening questions prior to the visit, additional usage of staff PPE, and extensive cleaning of exam room while observing appropriate contact time as indicated for disinfecting solutions.   HPI  Katelyn Price is a 59 y.o.-year-old female, presenting for f/u for Graves ds. and thyroid nodules. Last visit 6 months ago.  At last visit, she just saw neurology and the plan was to take her off lithium, but it was decided to continue.  She is getting steroid inj's in R hip - last inj 03/15/2019. She will have hip replacement.  Reviewed history: Patient was diagnosed with thyrotoxicosis in summer 2017 and was confirmed as Graves' disease on an uptake and scan from 10/2015.   At last visit, we continued methimazole 2.5 mg daily.  However, she is not sure if she is taking, tablets or 1 tablet daily.  We tried to call her husband during the visit but could not get in touch with him.  I advised her to send me a message through my chart to call me when she gets home letting me know exactly how much methimazole she is taking.  Reviewed her TFTs: Lab Results  Component Value Date   TSH 0.44 10/12/2018   TSH 1.11 07/09/2018   TSH 1.55 03/23/2018   TSH 1.75 12/26/2017   TSH 6.31 (H) 11/20/2017   TSH 3.27 08/23/2017   TSH 1.32 05/31/2017   TSH 0.20 (L) 04/25/2017   TSH 0.02 (L) 03/17/2017   TSH <0.01 (L) 02/09/2017   FREET4 0.55 (L) 10/12/2018   FREET4 0.41 (L) 07/09/2018   FREET4 0.46 (L) 03/23/2018   FREET4 0.42 (L) 12/26/2017   FREET4 0.42 (L) 11/20/2017   FREET4 0.41 (L) 08/23/2017   FREET4 0.51 (L) 05/31/2017   FREET4 0.53 (L) 04/25/2017   FREET4 0.64 03/17/2017   FREET4 0.84 02/09/2017   Lab Results  Component Value Date   T3FREE 3.3 10/12/2018   T3FREE 2.8 07/09/2018   T3FREE 3.7 03/23/2018     T3FREE 3.2 12/26/2017   T3FREE 3.2 11/20/2017   T3FREE 3.0 08/23/2017   T3FREE 3.7 05/31/2017   T3FREE 3.7 04/25/2017   T3FREE 2.9 03/17/2017   T3FREE 3.4 02/09/2017   Her Graves' antibodies were elevated: Lab Results  Component Value Date   TSI 411 (H) 07/09/2018   TSI 499 (H) 05/31/2017   TSI 442 (H) 11/14/2016   TSI 630 (H) 10/14/2015   Component     Latest Ref Rng & Units 08/25/2015  Thyrotropin Receptor Ab     <=16.0 % 41.4 (H)   Reviewed previous tests reports and images on Thyroid ultrasound on 09/04/2015: Right thyroid lobe: 4.1 cm x 1.7 cm x 1.9 cm. Relatively homogeneous appearance of right thyroid tissue.  Right thyroid nodule #1 measures 0.7 cm x 0.7 cm x 0.7 cm mixed cystic and solid composition (1), with hypoechoic echogenicity (2),  taller than wide shape (3) Left thyroid lobe: 5.1 cm x 1.9 cm x 2.4 cm. Relatively homogeneous appearance of the left thyroid.  *Inferior left nodule #1 measures 1.5 cm x 1.1 cm x 1.3 cm Nearly completely solid composition (2), with isoechoic  echogenicity (1).   There are 3 additional separate nodules all measuring less than 5 mm with characteristics compatible with colloid cysts. Isthmus Thickness: 0.4 cm.  No  nodules visualized. Lymphadenopathy: None visualized.  Thyroid Uptake and scan (10/22/2015) >> Graves' disease.: 24 hour radioactive iodine uptake 36.9%.Diffuse radiotracer uptake identified within both lobes of the thyroid gland.  No dominant  She was initially started on methimazole 10 mg twice a day, then decreased to 5 mg twice a day in 02/2016 after which TFTs normalized.  We were able to decrease the dose further to 5 mg daily.  We have tried an even lower dose but her TSH became suppressed on less than 5 mg methimazole daily.    At last visit she was on 5 mg twice a day of methimazole, dose increased 01/2017.    In 11/2017, we were able to decrease the dose to 5 mg once a day.  In 03/2018 we were able to decrease  the dose to 2.5 mg once a day.  In 10/2018, we continued methimazole 2.5 mg once a day.  Thyroid U/S (10/26/2018): Stable, small, nodules: Parenchymal Echotexture: Moderately heterogenous Isthmus: Normal in size measuring 0.4 cm in diameter Right lobe: Normal in size measuring 5.2 x 2.1 x 2.2 cm, unchanged previously, 4.1 x 1.7 x 1.9 cm Left lobe: Normal in size measuring 5.4 x 2.0 x 2.4 cm, unchanged, previously, 5.1 x 1.9 x 2.4 cm _________________________________________________________  Previously noted approximately 0.7 cm minimally complex cyst within the inferior pole the right lobe of the thyroid is not demonstrated on the present examination. __________________________________________________  The previously noted approximately 1.5 x 1.3 x 1.1 cm isoechoic nodule within the mid, inferior aspect the left lobe of the thyroid is not seen on the present examination and thus may have represented a pseudonodule.  There is an approximately 1.0 x 1.0 x 0.8 cm isoechoic ill-defined nodule/pseudonodule within the mid aspect the left lobe of the thyroid (labeled 1) was not definitely seen on the 2017 examination however does not meet imaging criteria to recommend percutaneous sampling or continued dedicated follow-up.  There is an approximately 1.0 x 0.9 x 0.9 cm partially cystic, partially solid nodule within the inferior pole the left lobe of the thyroid (labeled 2), which was not definitely seen on the 2017 examination, however does not meet imaging criteria to recommend percutaneous sampling or continued dedicated follow-up.  IMPRESSION: 1. Findings suggestive of multinodular goiter. 2. No worrisome new or enlarging thyroid nodules. 3. None of the discretely measured thyroid nodules meet imaging criteria to recommend percutaneous sampling or continued dedicated follow-up.  Electronically Signed   By: Sandi Mariscal M.D.   On: 10/26/2018 10:45      She is on  propranolol 120 mg in a.m. for tremors.  On lithium for bipolar disorder.  Pt denies: - feeling nodules in neck - hoarseness - dysphagia - choking - SOB with lying down  Pt does have a FH of thyroid ds on father's side of the family. No FH of thyroid cancer. No h/o radiation tx to head or neck.  No seaweed or kelp. No recent contrast studies. No herbal supplements. No Biotin use. No recent steroids use.   Pt. also has a history of GERD, psoriasis, bipolar dis., HTN, B12 deficiency.   B12 levels normal at last check Lab Results  Component Value Date   VITAMINB12 564 08/09/2018   VITAMINB12 1,157 (H) 10/05/2017   VITAMINB12 968 (H) 08/22/2016   VITAMINB12 >1500 (H) 08/21/2015   VITAMINB12 >1500 (H) 08/11/2014   VITAMINB12 189 (L) 07/26/2013   VITAMINB12 127 (L) 09/03/2012   + History of vaginal spotting-seeing OB/GYN (Dr. Milta Deiters).  On HRT.  ROS: Constitutional: no weight gain/no weight loss, no fatigue, no subjective hyperthermia, no subjective hypothermia Eyes: no blurry vision, no xerophthalmia ENT: no sore throat, + see HPI. Cardiovascular: no CP/no SOB/no palpitations/no leg swelling Respiratory: no cough/no SOB/no wheezing Gastrointestinal: no N/no V/no D/no C/no acid reflux Musculoskeletal: no muscle aches/+ joint aches Skin: no rashes, no hair loss Neurological: + tremors/no numbness/no tingling/no dizziness  I reviewed pt's medications, allergies, PMH, social hx, family hx, and changes were documented in the history of present illness. Otherwise, unchanged from my initial visit note.  Past Medical History:  Diagnosis Date  . Abnormal involuntary movements(781.0)   . Acne   . Allergic rhinitis   . Bipolar disorder, unspecified ([place])    psych - Dr. Lattie Haw  . Common migraine with intractable migraine 10/09/2018  . Depression   . Diaphragmatic hernia without mention of obstruction or gangrene   . GERD (gastroesophageal reflux disease)    03/18/02, EGD, [facility]  Gerd,gastritis, dilated  . History of MRI of brain and brain stem 06/01/03   [facility]  . Hypertension   . Other psoriasis   . Tremor of both hands 10/09/2018  . Unspecified sinusitis (chronic)    Past Surgical History:  Procedure Laterality Date  . COLONOSCOPY  03/18/02   internal hemorrhoids  . COLONOSCOPY  06/2014   diverticulosis, rpt 10 yrs Collene Mares)  . ESOPHAGOGASTRODUODENOSCOPY  1/99   [facility] I[facility]; HH,GERD  . ESOPHAGOGASTRODUODENOSCOPY  03/18/02   HH,GERD,Gastritis; Dilated  . LAPAROSCOPIC ESOPHAGOGASTRIC FUNDOPLASTY  08/28/02   [facility] Done   Social History   Social History  . Marital status: Married    Spouse name: N/A  . Number of children: 0   Occupational History  . Housewife    Social History Main Topics  . Smoking status: Former Smoker    Packs/day: 1.00    Years: 20.00    Types: Cigarettes    Quit date: 01/10/1998  . Smokeless tobacco: Never Used  . Alcohol use No  . Drug use: No   Social History Narrative   Caffeine: drinks diet drinks   Lives with husband and 1 dog, 1 cat   Occupation: housewife   Activity: no regular exercise, doesn't feel safe to walk outside at home, could walk on mother's treadmill   Diet: good water, fruits/vegetables some    Current Outpatient Medications on File Prior to Visit  Medication Sig Dispense Refill  . acetaminophen-codeine (TYLENOL #3) 300-30 MG tablet TAKE 1 TABLET BY MOUTH EVERY 4 (FOUR) HOURS AS NEEDED FOR MODERATE PAIN. 20 tablet 0  . amLODipine (NORVASC) 5 MG tablet Take 1 tablet (5 mg total) by mouth daily. 30 tablet 11  . buPROPion (WELLBUTRIN XL) 150 MG 24 hr tablet Take 2 tablets (300 mg total) by mouth daily.    . busPIRone (BUSPAR) 15 MG tablet Take 7.5 mg by mouth 3 (three) times daily.     . Ca & Phos-Vit D-Mag (CALCIUM) [phone] TABS Take by mouth.    . Cholecalciferol (VITAMIN D3) 125 MCG (5000 UT) CAPS Take 1 capsule by mouth daily.    . citalopram (CELEXA) 20 MG tablet Take 1 tablet (20 mg total) by mouth daily.     . Cyanocobalamin (VITAMELTS ENERGY VITAMIN B-12) 1500 MCG TBDP Take 1 tablet by mouth every Monday, Wednesday, and Friday.    . estradiol (ESTRACE) 2 MG tablet Take 2 mg by mouth daily.    . ferrous sulfate 324 (65 Fe) MG TBEC Take 1 tablet (325 mg total)  by mouth daily. 30 tablet   . lithium carbonate (ESKALITH) 450 MG CR tablet Take 1 tablet by mouth at bedtime.    . methimazole (TAPAZOLE) 5 MG tablet TAKE 1 TABLET BY MOUTH EVERY DAY 90 tablet 3  . pantoprazole (PROTONIX) 40 MG tablet TAKE 1 TABLET BY MOUTH EVERY DAY 90 tablet 3  . polyethylene glycol powder (GLYCOLAX/MIRALAX) 17 GM/SCOOP powder Take 17 g by mouth daily as needed for moderate constipation. 3350 g 1  . progesterone (PROMETRIUM) 200 MG capsule Take 1 capsule (200 mg total) by mouth at bedtime.    . propranolol ER (INDERAL LA) 120 MG 24 hr capsule Take by mouth daily.    . psyllium (METAMUCIL SMOOTH TEXTURE) 58.6 % powder Take 1 packet by mouth daily.    . ziprasidone (GEODON) 80 MG capsule Take 240 mg by mouth at bedtime.      No current facility-administered medications on file prior to visit.   Allergies  Allergen Reactions  . Hydrocodone-Homatropine     REACTION: unspecified  . Minocycline Hcl     REACTION: Itching   Family History  Problem Relation Age of Onset  . Arthritis Mother        fibromyalgia  . Alcohol abuse Father   . Emphysema Brother        smoker  . COPD Brother        smoker, continues.  . Arthritis Sister        fibromyalgia  . Cancer Neg Hx   . Coronary artery disease Neg Hx   . Stroke Neg Hx   . Diabetes Neg Hx    PE: BP 118/80   Pulse 65   Ht 5' 1.5" (1.562 m)   Wt 178 lb (80.7 kg)   SpO2 96%   BMI 33.09 kg/m  Wt Readings from Last 3 Encounters:  04/22/19 178 lb (80.7 kg)  03/11/19 180 lb 8 oz (81.9 kg)  02/15/19 178 lb 3 oz (80.8 kg)   Constitutional: overweight, in NAD Eyes: PERRLA, EOMI, no exophthalmos ENT: moist mucous membranes, no thyromegaly, no cervical  lymphadenopathy Cardiovascular: RRR, No MRG Respiratory: CTA B Gastrointestinal: abdomen soft, NT, ND, BS+ Musculoskeletal: no deformities, strength intact in all 4 Skin: moist, warm, no rashes Neurological: +++ tremor with outstretched hands, DTR normal in all 4  ASSESSMENT: 1.  Graves' disease  2. Thyroid nodules  PLAN:  1. Patient with history of thyrotoxicosis diagnosed, when she presented with thyrotoxic symptoms: Tremors (however, these are likely due to lithium), heat intolerance, palpitations, shortness of breath.  She started methimazole and we were able to decrease the dose gradually to 2.5 mg daily.  However, she is not sure whether she is taking 2.5 to 5 mg daily.  She will check at home with her husband, who prepares the medicines for her, and let me know. -she feels well on this except she continues to have tremors, which are chronic. -Review latest TFTs from 10/2018: Normal -Latest TSI's were still high. -We will repeat her TFTs today and change the methimazole dose accordingly -She continues on beta-blocker for tremors, and this should also help with tachycardia -We will see her back in 6 months.  2. Thyroid nodules -She denies neck compression symptoms -We reviewed the fact that she has a 1.5 cm  iso-/or slightly hypoechoic thyroid nodule, with internal blood flow on her ultrasound from 08/2015.  This nodule appeared warm on the uptake and scan which can either indicate an inflammatory nodule (pseudo-nodule) or a  normal functioning one.  Since the nodule was seen in the context of thyrotoxicosis, we decided to wait until she was euthyroid and repeat the ultrasound then. -At this visit, we reviewed the report of the thyroid ultrasound obtained after last visit: 2 of her thyroid nodules were not visible anymore (one a small cyst and the other a pseudonodule) and 2 nodules are very small, not worrisome.  No further follow-up is needed for these.  Component     Latest Ref Rng  & Units 04/22/2019  TSH     0.35 - 4.50 uIU/mL 0.54  T4,Free(Direct)     0.60 - 1.60 ng/dL 0.51 (L)  Triiodothyronine,Free,Serum     2.3 - 4.2 pg/mL 3.4   TSH normal.  We will continue the current dose of methimazole.  Philemon Kingdom, MD PhD [facility]

## 2019-04-22 NOTE — Patient Instructions (Signed)
Please continue Methimazole but let me know if you take 2.5 mg or 5 mg daily.  Please stop at the lab.  Please come back for a follow-up appointment in 6 months.

## 2019-04-23 ENCOUNTER — Other Ambulatory Visit: Payer: Self-pay | Admitting: Internal Medicine

## 2019-04-23 MED ORDER — METHIMAZOLE 5 MG PO TABS: ORAL_TABLET | ORAL | 3 refills | Status: DC

## 2019-05-12 ENCOUNTER — Encounter: Payer: Self-pay | Admitting: Family Medicine

## 2019-05-13 NOTE — Telephone Encounter (Signed)
Updated pt's chart.  Notified her via [facility].

## 2019-07-03 LAB — HM MAMMOGRAPHY

## 2019-08-15 ENCOUNTER — Other Ambulatory Visit: Payer: Self-pay | Admitting: Family Medicine

## 2019-08-15 DIAGNOSIS — F3170 Bipolar disorder, currently in remission, most recent episode unspecified: Secondary | ICD-10-CM

## 2019-08-15 DIAGNOSIS — N289 Disorder of kidney and ureter, unspecified: Secondary | ICD-10-CM

## 2019-08-15 DIAGNOSIS — Z13220 Encounter for screening for lipoid disorders: Secondary | ICD-10-CM

## 2019-08-15 DIAGNOSIS — E538 Deficiency of other specified B group vitamins: Secondary | ICD-10-CM

## 2019-08-16 ENCOUNTER — Other Ambulatory Visit (INDEPENDENT_AMBULATORY_CARE_PROVIDER_SITE_OTHER): Payer: 59

## 2019-08-16 ENCOUNTER — Ambulatory Visit: Payer: 59

## 2019-08-16 ENCOUNTER — Other Ambulatory Visit: Payer: Self-pay

## 2019-08-16 DIAGNOSIS — F3170 Bipolar disorder, currently in remission, most recent episode unspecified: Secondary | ICD-10-CM

## 2019-08-16 DIAGNOSIS — Z13220 Encounter for screening for lipoid disorders: Secondary | ICD-10-CM | POA: Diagnosis not present

## 2019-08-16 DIAGNOSIS — E538 Deficiency of other specified B group vitamins: Secondary | ICD-10-CM

## 2019-08-16 DIAGNOSIS — N289 Disorder of kidney and ureter, unspecified: Secondary | ICD-10-CM

## 2019-08-16 LAB — CBC WITH DIFFERENTIAL/PLATELET
Basophils Absolute: 0 10*3/uL (ref 0.0–0.1)
Basophils Relative: 0.5 % (ref 0.0–3.0)
Eosinophils Absolute: 0.2 10*3/uL (ref 0.0–0.7)
Eosinophils Relative: 2.3 % (ref 0.0–5.0)
HCT: 36.5 % (ref 36.0–46.0)
Hemoglobin: 12 g/dL (ref 12.0–15.0)
Lymphocytes Relative: 23.3 % (ref 12.0–46.0)
Lymphs Abs: 1.9 10*3/uL (ref 0.7–4.0)
MCHC: 32.9 g/dL (ref 30.0–36.0)
MCV: 102.8 fl — ABNORMAL HIGH (ref 78.0–100.0)
Monocytes Absolute: 0.7 10*3/uL (ref 0.1–1.0)
Monocytes Relative: 8.2 % (ref 3.0–12.0)
Neutro Abs: 5.5 10*3/uL (ref 1.4–7.7)
Neutrophils Relative %: 65.7 % (ref 43.0–77.0)
Platelets: 282 10*3/uL (ref 150.0–400.0)
RBC: 3.55 Mil/uL — ABNORMAL LOW (ref 3.87–5.11)
RDW: 14.7 % (ref 11.5–15.5)
WBC: 8.3 10*3/uL (ref 4.0–10.5)

## 2019-08-16 LAB — LIPID PANEL
Cholesterol: 143 mg/dL (ref 0–200)
HDL: 88.3 mg/dL (ref >39.00)
LDL Cholesterol: 31 mg/dL (ref 0–99)
NonHDL: 54.47
Total CHOL/HDL Ratio: 2
Triglycerides: 118 mg/dL (ref 0.0–149.0)
VLDL: 23.6 mg/dL (ref 0.0–40.0)

## 2019-08-16 LAB — COMPREHENSIVE METABOLIC PANEL
ALT: 11 U/L (ref 0–35)
AST: 14 U/L (ref 0–37)
Albumin: 3.7 g/dL (ref 3.5–5.2)
Alkaline Phosphatase: 55 U/L (ref 39–117)
BUN: 11 mg/dL (ref 6–23)
CO2: 25 mEq/L (ref 19–32)
Calcium: 10.1 mg/dL (ref 8.4–10.5)
Chloride: 104 mEq/L (ref 96–112)
Creatinine, Ser: 0.79 mg/dL (ref 0.40–1.20)
GFR: 74.61 mL/min (ref >60.00)
Glucose, Bld: 100 mg/dL — ABNORMAL HIGH (ref 70–99)
Potassium: 4.8 mEq/L (ref 3.5–5.1)
Sodium: 134 mEq/L — ABNORMAL LOW (ref 135–145)
Total Bilirubin: 0.3 mg/dL (ref 0.2–1.2)
Total Protein: 6.4 g/dL (ref 6.0–8.3)

## 2019-08-16 LAB — VITAMIN B12: Vitamin B-12: 675 pg/mL (ref 211–911)

## 2019-08-16 LAB — VITAMIN D 25 HYDROXY (VIT D DEFICIENCY, FRACTURES): VITD: 57.3 ng/mL (ref 30.00–100.00)

## 2019-08-16 NOTE — Addendum Note (Signed)
Addended by: Ellamae Sia on: 08/16/2019 04:07 PM   Modules accepted: Orders

## 2019-08-17 LAB — LITHIUM LEVEL: Lithium Lvl: 0.7 mmol/L (ref 0.6–1.2)

## 2019-08-19 ENCOUNTER — Encounter: Payer: Self-pay | Admitting: Family Medicine

## 2019-08-19 ENCOUNTER — Other Ambulatory Visit: Payer: Self-pay

## 2019-08-19 ENCOUNTER — Ambulatory Visit (INDEPENDENT_AMBULATORY_CARE_PROVIDER_SITE_OTHER): Payer: 59 | Admitting: Family Medicine

## 2019-08-19 VITALS — BP 150/98 | HR 51 | Temp 97.6°F | Ht 61.75 in | Wt 171.2 lb

## 2019-08-19 DIAGNOSIS — Z0000 Encounter for general adult medical examination without abnormal findings: Secondary | ICD-10-CM

## 2019-08-19 DIAGNOSIS — E538 Deficiency of other specified B group vitamins: Secondary | ICD-10-CM

## 2019-08-19 DIAGNOSIS — I10 Essential (primary) hypertension: Secondary | ICD-10-CM | POA: Diagnosis not present

## 2019-08-19 DIAGNOSIS — F3170 Bipolar disorder, currently in remission, most recent episode unspecified: Secondary | ICD-10-CM

## 2019-08-19 DIAGNOSIS — N289 Disorder of kidney and ureter, unspecified: Secondary | ICD-10-CM

## 2019-08-19 DIAGNOSIS — G251 Drug-induced tremor: Secondary | ICD-10-CM

## 2019-08-19 DIAGNOSIS — Z23 Encounter for immunization: Secondary | ICD-10-CM | POA: Diagnosis not present

## 2019-08-19 DIAGNOSIS — M25551 Pain in right hip: Secondary | ICD-10-CM

## 2019-08-19 DIAGNOSIS — K449 Diaphragmatic hernia without obstruction or gangrene: Secondary | ICD-10-CM

## 2019-08-19 DIAGNOSIS — K219 Gastro-esophageal reflux disease without esophagitis: Secondary | ICD-10-CM

## 2019-08-19 MED ORDER — PANTOPRAZOLE SODIUM 40 MG PO TBEC: 40.0000 mg | DELAYED_RELEASE_TABLET | Freq: Every day | ORAL | 3 refills | Status: DC

## 2019-08-19 MED ORDER — LOSARTAN POTASSIUM 25 MG PO TABS: 25.0000 mg | ORAL_TABLET | Freq: Every day | ORAL | 3 refills | Status: DC

## 2019-08-19 NOTE — Patient Instructions (Addendum)
First shingrix vaccine today. Return in 2-6 months for nurse visit to complete the series.  Schedule eye exam as you/re due.  You are doing well today We will refer you to Dr Ninfa Linden orthopedist for R hip.  Consider trying to take pantoprazole 40mg  every other day - we want to find least amount of medicine to do the job.  BP was too high - start losartan 25mg  daily.  Return as needed or in 3 months for blood pressure follow up visit.   Health Maintenance, Female Adopting a healthy lifestyle and getting preventive care are important in promoting health and wellness. Ask your health care provider about:  The right schedule for you to have regular tests and exams.  Things you can do on your own to prevent diseases and keep yourself healthy. What should I know about diet, weight, and exercise? Eat a healthy diet   Eat a diet that includes plenty of vegetables, fruits, low-fat dairy products, and lean protein.  Do not eat a lot of foods that are high in solid fats, added sugars, or sodium. Maintain a healthy weight Body mass index (BMI) is used to identify weight problems. It estimates body fat based on height and weight. Your health care provider can help determine your BMI and help you achieve or maintain a healthy weight. Get regular exercise Get regular exercise. This is one of the most important things you can do for your health. Most adults should:  Exercise for at least 150 minutes each week. The exercise should increase your heart rate and make you sweat (moderate-intensity exercise).  Do strengthening exercises at least twice a week. This is in addition to the moderate-intensity exercise.  Spend less time sitting. Even light physical activity can be beneficial. Watch cholesterol and blood lipids Have your blood tested for lipids and cholesterol at 58 years of age, then have this test every 5 years. Have your cholesterol levels checked more often if:  Your lipid or cholesterol  levels are high.  You are older than 58 years of age.  You are at high risk for heart disease. What should I know about cancer screening? Depending on your health history and family history, you may need to have cancer screening at various ages. This may include screening for:  Breast cancer.  Cervical cancer.  Colorectal cancer.  Skin cancer.  Lung cancer. What should I know about heart disease, diabetes, and high blood pressure? Blood pressure and heart disease  High blood pressure causes heart disease and increases the risk of stroke. This is more likely to develop in people who have high blood pressure readings, are of African descent, or are overweight.  Have your blood pressure checked: ? Every 3-5 years if you are 63-38 years of age. ? Every year if you are 10 years old or older. Diabetes Have regular diabetes screenings. This checks your fasting blood sugar level. Have the screening done:  Once every three years after age 58 if you are at a normal weight and have a low risk for diabetes.  More often and at a younger age if you are overweight or have a high risk for diabetes. What should I know about preventing infection? Hepatitis B If you have a higher risk for hepatitis B, you should be screened for this virus. Talk with your health care provider to find out if you are at risk for hepatitis B infection. Hepatitis C Testing is recommended for:  Everyone born from [birth year] through 1964.  Anyone with known risk factors for hepatitis C. Sexually transmitted infections (STIs)  Get screened for STIs, including gonorrhea and chlamydia, if: ? You are sexually active and are younger than 58 years of age. ? You are older than 58 years of age and your health care provider tells you that you are at risk for this type of infection. ? Your sexual activity has changed since you were last screened, and you are at increased risk for chlamydia or gonorrhea. Ask your health care  provider if you are at risk.  Ask your health care provider about whether you are at high risk for HIV. Your health care provider may recommend a prescription medicine to help prevent HIV infection. If you choose to take medicine to prevent HIV, you should first get tested for HIV. You should then be tested every 3 months for as long as you are taking the medicine. Pregnancy  If you are about to stop having your period (premenopausal) and you may become pregnant, seek counseling before you get pregnant.  Take 400 to 800 micrograms (mcg) of folic acid every day if you become pregnant.  Ask for birth control (contraception) if you want to prevent pregnancy. Osteoporosis and menopause Osteoporosis is a disease in which the bones lose minerals and strength with aging. This can result in bone fractures. If you are 80 years old or older, or if you are at risk for osteoporosis and fractures, ask your health care provider if you should:  Be screened for bone loss.  Take a calcium or vitamin D supplement to lower your risk of fractures.  Be given hormone replacement therapy (HRT) to treat symptoms of menopause. Follow these instructions at home: Lifestyle  Do not use any products that contain nicotine or tobacco, such as cigarettes, e-cigarettes, and chewing tobacco. If you need help quitting, ask your health care provider.  Do not use street drugs.  Do not share needles.  Ask your health care provider for help if you need support or information about quitting drugs. Alcohol use  Do not drink alcohol if: ? Your health care provider tells you not to drink. ? You are pregnant, may be pregnant, or are planning to become pregnant.  If you drink alcohol: ? Limit how much you use to 0-1 drink a day. ? Limit intake if you are breastfeeding.  Be aware of how much alcohol is in your drink. In the [place] one drink equals one 12 oz bottle of beer (355 mL), one 5 oz glass of wine (148 mL), or one 1  oz glass of hard liquor (44 mL). General instructions  Schedule regular health, dental, and eye exams.  Stay current with your vaccines.  Tell your health care provider if: ? You often feel depressed. ? You have ever been abused or do not feel safe at home. Summary  Adopting a healthy lifestyle and getting preventive care are important in promoting health and wellness.  Follow your health care provider's instructions about healthy diet, exercising, and getting tested or screened for diseases.  Follow your health care provider's instructions on monitoring your cholesterol and blood pressure. This information is not intended to replace advice given to you by your health care provider. Make sure you discuss any questions you have with your health care provider. Document Revised: 12/20/2017 Document Reviewed: 12/20/2017 Elsevier Patient Education  2020 [employer].

## 2019-08-19 NOTE — Assessment & Plan Note (Signed)
Vitamin b12 and D now normal on daily replacement.

## 2019-08-19 NOTE — Progress Notes (Signed)
This visit was conducted in person.  BP (!) 150/98 (BP Location: Left Arm, Patient Position: Sitting, Cuff Size: Normal)   Pulse (!) 51   Temp 97.6 F (36.4 C) (Temporal)   Ht 5' 1.75" (1.568 m)   Wt 171 lb 4 oz (77.7 kg)   SpO2 99%   BMI 31.58 kg/m   BP Readings from Last 3 Encounters:  08/19/19 (!) 150/98  04/22/19 118/80  03/11/19 110/84    CC: CPE Subjective:    Patient ID: Katelyn Price, female    DOB: 06/17/1960, 59 y.o.   MRN: [id number]  HPI: Katelyn Price is a 59 y.o. female presenting on 08/19/2019 for Annual Exam   HTN - she stopped amlodipine due to ankle swelling. Does not check BP at home.   Graves on methimazole 2.5mg  once daily - sees endo Cruzita Lederer), h/o thyroid nodules  Tremors - saw Dr Jannifer Franklin who thought tremor coming from lithium - advised taper off. Started on topamax for migraines - but actually worsened headaches so now off this.   Notes worsening R hip pain present for the last 12 months. Saw Dr Lorelei Pont 03/2019 with xrays showing moderate R hip arthritis s/p fluoro guided R hip injection with benefit for several months. Ready to proceed with ortho eval.   Preventative: COLONOSCOPY6/2016diverticulosis, rpt 10 yrs Collene Mares)  Well woman with OBGYN 03/2019 (Dr. Nori Riis), normal mammogram and pap smear per patient. Continues estrace 2mg  daily with progesterone one week per month to have periods. Thinks periods stopped 2 months ago.  LMP 06/2019  Lung cancer screening - not eligible  Flu shot yearly Tdap - 06/2011 COVID vaccine - J&J 05/2019 Shingrix - discussed.  Seat belt use discussed  Sunscreen use discussed, no changing moleson skin  Ex smoker - quit 2007. Husband smokes pipe and e-cig Alcohol- none Dentist Q6 mo Eye exam - overdue   Caffeine: drinks diet drinks  Lives with husband and 1 dog, 1 cat  Occupation: housewife  Activity:no regular exercise Diet: good water, fruits/vegetables some     Relevant past medical, surgical,  family and social history reviewed and updated as indicated. Interim medical history since our last visit reviewed. Allergies and medications reviewed and updated. Outpatient Medications Prior to Visit  Medication Sig Dispense Refill  . acetaminophen-codeine (TYLENOL #3) 300-30 MG tablet TAKE 1 TABLET BY MOUTH EVERY 4 (FOUR) HOURS AS NEEDED FOR MODERATE PAIN. 20 tablet 0  . buPROPion (WELLBUTRIN XL) 150 MG 24 hr tablet Take 2 tablets (300 mg total) by mouth daily.    . busPIRone (BUSPAR) 15 MG tablet Take 7.5 mg by mouth 3 (three) times daily.     . Ca & Phos-Vit D-Mag (CALCIUM) [phone] TABS Take by mouth.    . Cholecalciferol (VITAMIN D3) 125 MCG (5000 UT) CAPS Take 1 capsule by mouth daily.    . citalopram (CELEXA) 20 MG tablet Take 1 tablet (20 mg total) by mouth daily.    . Cyanocobalamin (VITAMELTS ENERGY VITAMIN B-12) 1500 MCG TBDP Take 1 tablet by mouth every Monday, Wednesday, and Friday.    . estradiol (ESTRACE) 2 MG tablet Take 2 mg by mouth daily.    . ferrous sulfate 324 (65 Fe) MG TBEC Take 1 tablet (325 mg total) by mouth daily. 30 tablet   . lithium carbonate (ESKALITH) 450 MG CR tablet Take 1 tablet by mouth at bedtime. Takes 1.5 tablets    . methimazole (TAPAZOLE) 5 MG tablet TAKE 1/2 TABLET BY MOUTH EVERY  DAY 90 tablet 3  . polyethylene glycol powder (GLYCOLAX/MIRALAX) 17 GM/SCOOP powder Take 17 g by mouth daily as needed for moderate constipation. 3350 g 1  . progesterone (PROMETRIUM) 200 MG capsule Take 1 capsule (200 mg total) by mouth at bedtime.    . propranolol ER (INDERAL LA) 120 MG 24 hr capsule Take by mouth daily.    . psyllium (METAMUCIL SMOOTH TEXTURE) 58.6 % powder Take 1 packet by mouth daily.    . ziprasidone (GEODON) 80 MG capsule Take 240 mg by mouth at bedtime.     . pantoprazole (PROTONIX) 40 MG tablet TAKE 1 TABLET BY MOUTH EVERY DAY 90 tablet 3  . amLODipine (NORVASC) 5 MG tablet Take 1 tablet (5 mg total) by mouth daily. 30 tablet 11   No  facility-administered medications prior to visit.     Per HPI unless specifically indicated in ROS section below Review of Systems  Constitutional: Negative for activity change, appetite change, chills, fatigue, fever and unexpected weight change.  HENT: Negative for hearing loss.   Eyes: Negative for visual disturbance.  Respiratory: Positive for cough. Negative for chest tightness, shortness of breath and wheezing.   Cardiovascular: Positive for chest pain. Negative for palpitations and leg swelling.  Gastrointestinal: Positive for abdominal pain (mild). Negative for abdominal distention, blood in stool, constipation, diarrhea, nausea and vomiting.  Genitourinary: Negative for difficulty urinating and hematuria.  Musculoskeletal: Negative for arthralgias, myalgias and neck pain.  Skin: Negative for rash.  Neurological: Positive for headaches (h/o migraines - manages with ibuprofen). Negative for dizziness, seizures and syncope.  Hematological: Negative for adenopathy. Does not bruise/bleed easily.  Psychiatric/Behavioral: Negative for dysphoric mood. The patient is nervous/anxious.    Objective:  BP (!) 150/98 (BP Location: Left Arm, Patient Position: Sitting, Cuff Size: Normal)   Pulse (!) 51   Temp 97.6 F (36.4 C) (Temporal)   Ht 5' 1.75" (1.568 m)   Wt 171 lb 4 oz (77.7 kg)   SpO2 99%   BMI 31.58 kg/m   Wt Readings from Last 3 Encounters:  08/19/19 171 lb 4 oz (77.7 kg)  04/22/19 178 lb (80.7 kg)  03/11/19 180 lb 8 oz (81.9 kg)      Physical Exam Vitals and nursing note reviewed.  Constitutional:      General: She is not in acute distress.    Appearance: Normal appearance. She is well-developed. She is not ill-appearing.  HENT:     Head: Normocephalic and atraumatic.     Right Ear: Hearing, tympanic membrane, ear canal and external ear normal.     Left Ear: Hearing, tympanic membrane, ear canal and external ear normal.     Mouth/Throat:     Pharynx: Uvula midline.    Eyes:     General: No scleral icterus.    Extraocular Movements: Extraocular movements intact.     Conjunctiva/sclera: Conjunctivae normal.     Pupils: Pupils are equal, round, and reactive to light.  Cardiovascular:     Rate and Rhythm: Normal rate and regular rhythm.     Pulses: Normal pulses.          Radial pulses are 2+ on the right side and 2+ on the left side.     Heart sounds: Normal heart sounds. No murmur heard.   Pulmonary:     Effort: Pulmonary effort is normal. No respiratory distress.     Breath sounds: Normal breath sounds. No wheezing, rhonchi or rales.  Abdominal:     General: Abdomen  is flat. Bowel sounds are normal. There is no distension.     Palpations: Abdomen is soft. There is no mass.     Tenderness: There is no abdominal tenderness. There is no guarding or rebound.     Hernia: No hernia is present.  Musculoskeletal:        General: Normal range of motion.     Cervical back: Normal range of motion and neck supple.     Right lower leg: No edema.     Left lower leg: No edema.     Comments:  Pain with int/ext rotation at right hip Neg seated SLR on right  Lymphadenopathy:     Cervical: No cervical adenopathy.  Skin:    General: Skin is warm and dry.     Findings: No rash.  Neurological:     General: No focal deficit present.     Mental Status: She is alert and oriented to person, place, and time.     Motor: Tremor (marked, both resting and action component) present.     Comments: CN grossly intact, station and gait intact  Psychiatric:        Mood and Affect: Mood normal.        Behavior: Behavior normal.        Thought Content: Thought content normal.        Judgment: Judgment normal.       Results for orders placed or performed in visit on 08/16/19  VITAMIN D 25 Hydroxy (Vit-D Deficiency, Fractures)  Result Value Ref Range   VITD 57.30 30.00 - 100.00 ng/mL  Vitamin B12  Result Value Ref Range   Vitamin B-12 675 211 - 911 pg/mL  CBC with  Differential/Platelet  Result Value Ref Range   WBC 8.3 4.0 - 10.5 K/uL   RBC 3.55 (L) 3.87 - 5.11 Mil/uL   Hemoglobin 12.0 12.0 - 15.0 g/dL   HCT 36.5 36 - 46 %   MCV 102.8 (H) 78.0 - 100.0 fl   MCHC 32.9 30.0 - 36.0 g/dL   RDW 14.7 11.5 - 15.5 %   Platelets 282.0 150 - 400 K/uL   Neutrophils Relative % 65.7 43 - 77 %   Lymphocytes Relative 23.3 12 - 46 %   Monocytes Relative 8.2 3 - 12 %   Eosinophils Relative 2.3 0 - 5 %   Basophils Relative 0.5 0 - 3 %   Neutro Abs 5.5 1.4 - 7.7 K/uL   Lymphs Abs 1.9 0.7 - 4.0 K/uL   Monocytes Absolute 0.7 0 - 1 K/uL   Eosinophils Absolute 0.2 0 - 0 K/uL   Basophils Absolute 0.0 0 - 0 K/uL  Comprehensive metabolic panel  Result Value Ref Range   Sodium 134 (L) 135 - 145 mEq/L   Potassium 4.8 3.5 - 5.1 mEq/L   Chloride 104 96 - 112 mEq/L   CO2 25 19 - 32 mEq/L   Glucose, Bld 100 (H) 70 - 99 mg/dL   BUN 11 6 - 23 mg/dL   Creatinine, Ser 0.79 0.40 - 1.20 mg/dL   Total Bilirubin 0.3 0.2 - 1.2 mg/dL   Alkaline Phosphatase 55 39 - 117 U/L   AST 14 0 - 37 U/L   ALT 11 0 - 35 U/L   Total Protein 6.4 6.0 - 8.3 g/dL   Albumin 3.7 3.5 - 5.2 g/dL   GFR 74.61 >60.00 mL/min   Calcium 10.1 8.4 - 10.5 mg/dL  Lipid panel  Result Value Ref Range  Cholesterol 143 0 - 200 mg/dL   Triglycerides 118.0 0 - 149 mg/dL   HDL 88.30 >39.00 mg/dL   VLDL 23.6 0.0 - 40.0 mg/dL   LDL Cholesterol 31 0 - 99 mg/dL   Total CHOL/HDL Ratio 2    NonHDL 54.47   Lithium level  Result Value Ref Range   Lithium Lvl 0.7 0.6 - 1.2 mmol/L   Lab Results  Component Value Date   HGBA1C 4.9 06/16/2011    Assessment & Plan:  This visit occurred during the SARS-CoV-2 public health emergency.  Safety protocols were in place, including screening questions prior to the visit, additional usage of staff PPE, and extensive cleaning of exam room while observing appropriate contact time as indicated for disinfecting solutions.   Problem List Items Addressed This Visit    Right  hip pain    Story/exam consistent with osteoarthritis related hip pain, has received intraarticular injection with limited relief. Will refer to ortho for further evaluation. Pt agrees wit0h plan.       Relevant Orders   Ambulatory referral to Orthopedic Surgery   Renal insufficiency    Recent levels actually improved - continue to monitor.      HYPERTENSION, BENIGN ESSENTIAL    Chronic, deteriorated since she stopped amlodipine due to ankle swelling. Will start losartan 25mg  daily, RTC 3 mo HTN f/u visit .       Relevant Medications   losartan (COZAAR) 25 MG tablet   Hiatal hernia   Healthcare maintenance - Primary    Preventative protocols reviewed and updated unless pt declined. Discussed healthy diet and lifestyle.       GERD (gastroesophageal reflux disease)    Continues PPI daily. Suggested trial QOD dosing to decrease PPI dependence.       Relevant Medications   pantoprazole (PROTONIX) 40 MG tablet   Drug-induced tremor    Appreciate neurology care - med induced + essential tremor component.  Lithium dose was recently decreased, levels stable over the past year. Continue to monitor.       Bipolar disorder [facility])    Appreciate psych care, followed by Josephine Igo psychiatrist.      B12 deficiency    Vitamin b12 and D now normal on daily replacement.        Other Visit Diagnoses    Need for shingles vaccine       Relevant Orders   Varicella-zoster vaccine IM (Completed)       Meds ordered this encounter  Medications  . pantoprazole (PROTONIX) 40 MG tablet    Sig: Take 1 tablet (40 mg total) by mouth daily.    Dispense:  90 tablet    Refill:  3  . losartan (COZAAR) 25 MG tablet    Sig: Take 1 tablet (25 mg total) by mouth daily.    Dispense:  90 tablet    Refill:  3   Orders Placed This Encounter  Procedures  . Varicella-zoster vaccine IM  . Ambulatory referral to Orthopedic Surgery    Referral Priority:   Routine    Referral Type:   Surgical     Referral Reason:   Specialty Services Required    Requested Specialty:   Orthopedic Surgery    Number of Visits Requested:   1    Patient instructions: First shingrix vaccine today. Return in 2-6 months for nurse visit to complete the series.  Schedule eye exam as you/re due.  You are doing well today We will refer you to Dr  [facility] orthopedist for R hip.  Consider trying to take pantoprazole 40mg  every other day - we want to find least amount of medicine to do the job.  BP was too high - start losartan 25mg  daily.  Return as needed or in 3 months for blood pressure follow up visit.   Follow up plan: Return in about 3 months (around 11/19/2019), or if symptoms worsen or fail to improve, for follow up visit.  Ria Bush, MD

## 2019-08-19 NOTE — Assessment & Plan Note (Signed)
Appreciate psych care, followed by Josephine Igo psychiatrist.

## 2019-08-19 NOTE — Assessment & Plan Note (Signed)
Appreciate neurology care - med induced + essential tremor component.  Lithium dose was recently decreased, levels stable over the past year. Continue to monitor.

## 2019-08-19 NOTE — Assessment & Plan Note (Addendum)
Chronic, deteriorated since she stopped amlodipine due to ankle swelling. Will start losartan 25mg  daily, RTC 3 mo HTN f/u visit .

## 2019-08-19 NOTE — Assessment & Plan Note (Addendum)
Continues PPI daily. Suggested trial QOD dosing to decrease PPI dependence.

## 2019-08-19 NOTE — Assessment & Plan Note (Signed)
Story/exam consistent with osteoarthritis related hip pain, has received intraarticular injection with limited relief. Will refer to ortho for further evaluation. Pt agrees wit0h plan.

## 2019-08-19 NOTE — Assessment & Plan Note (Signed)
Recent levels actually improved - continue to monitor.

## 2019-08-19 NOTE — Assessment & Plan Note (Signed)
Preventative protocols reviewed and updated unless pt declined. Discussed healthy diet and lifestyle.  

## 2019-08-28 ENCOUNTER — Ambulatory Visit: Payer: 59 | Admitting: Orthopaedic Surgery

## 2019-09-04 ENCOUNTER — Ambulatory Visit (INDEPENDENT_AMBULATORY_CARE_PROVIDER_SITE_OTHER): Payer: 59 | Admitting: Physician Assistant

## 2019-09-04 ENCOUNTER — Encounter: Payer: Self-pay | Admitting: Physician Assistant

## 2019-09-04 DIAGNOSIS — M1611 Unilateral primary osteoarthritis, right hip: Secondary | ICD-10-CM | POA: Diagnosis not present

## 2019-09-04 MED ORDER — ACETAMINOPHEN-CODEINE #3 300-30 MG PO TABS: 1.0000 | ORAL_TABLET | ORAL | 0 refills | Status: DC | PRN

## 2019-09-04 NOTE — Progress Notes (Signed)
Office Visit Note   Patient: Katelyn Price           Date of Birth: 1960/05/04           MRN: [id number] Visit Date: 09/04/2019              Requested by: Ria Bush, MD [address] [place],  [place] [address] PCP: Ria Bush, MD   Assessment & Plan: Visit Diagnoses:  1. Primary osteoarthritis of right hip     Plan: Due to the fact that patient has severe pain in the right hip it definitely keeps her awake and affects her daily living.  And the fact that she has failed conservative treatment which is included time medications and intra-articular injection would recommend right total hip arthroplasty.  However given the COVID-19 pandemic at this point time she understands that she would need to be done as a outpatient.  She is agreeable with this.  Risk benefits of surgery were reviewed with the patient at length.  Risk include but are not limited to nerve/vessel injury, wound healing problems, leg length discrepancy, blood loss, PE/DVT and prolonged pain.  She wishes to proceed with surgery in the near future.  We will work on setting this up.  She is given Samella Parr card and Judeen Hammans will call her when we are able to schedule surgery.  Puncture on anterior hip replacement was given to the patient.  Hip arthroplasty components were shown the patient today.  Follow-Up Instructions: Return 2 WEEKS POST OP.   Orders:  No orders of the defined types were placed in this encounter.  Meds ordered this encounter  Medications  . acetaminophen-codeine (TYLENOL #3) 300-30 MG tablet    Sig: Take 1 tablet by mouth every 4 (four) hours as needed for moderate pain.    Dispense:  20 tablet    Refill:  0    Not to exceed 5 additional fills before 09/14/2019      Procedures: No procedures performed   Clinical Data: No additional findings.   Subjective: Chief Complaint  Patient presents with  . Right Hip - Pain    HPI Patient is a 59 year old female seen  for the first time for right hip pain.  For her primary care physician Danise Mina for right hip arthritis.  She states that she has had increased pain over the past months.  Follows with 1 year.  She has had at least 1 intra-articular injection in her right hip April 2021.  She states the injections was helpful for proximately 3 months.  She is having deep groin pain.  She has had some limited range of motion and has to help her leg up when putting on shoes socks.  Pain does awaken her.  She denies any radicular symptoms down the leg. Radiographs right hip 03/11/19 show severe end-stage arthritis of the right hip.  There is no signs of AVN or fracture. Left hip well preserved.   Review of Systems Negative for fevers or chills.   Objective: Vital Signs: There were no vitals taken for this visit.  Physical Exam Constitutional:      Appearance: She is not ill-appearing or diaphoretic.  Pulmonary:     Effort: Pulmonary effort is normal.  Neurological:     Mental Status: She is oriented to person, place, and time.  Psychiatric:        Mood and Affect: Mood normal.     Ortho Exam  Left hip full range  of motion without pain.  Painful right hip limited internal and external rotation.  Right calf supple and non tender.   Specialty Comments:  No specialty comments available.  Imaging: No results found.   PMFS History: Patient Active Problem List   Diagnosis Date Noted  . Displaced fracture of proximal third of navicular bone of right wrist with delayed healing 12/10/2018  . Right hip pain 12/10/2018  . Common migraine with intractable migraine 10/09/2018  . Hyperkalemia 08/15/2018  . Hyponatremia 08/15/2018  . Multiple thyroid nodules 10/14/2015  . Graves disease 08/25/2015  . Hypercalcemia 07/28/2013  . B12 deficiency 09/05/2012  . Renal insufficiency 07/27/2012  . Healthcare maintenance 06/23/2011  . ALLERGIC RHINITIS, SEASONAL 04/10/2009  . Drug-induced tremor 07/15/2008  .  Impaired fasting glucose 06/07/2007  . PSORIASIS NEC 06/01/2006  . Bipolar disorder ([place]) 05/31/2006  . HYPERTENSION, BENIGN ESSENTIAL 05/31/2006  . GERD (gastroesophageal reflux disease) 05/31/2006  . Hiatal hernia 05/31/2006   Past Medical History:  Diagnosis Date  . Abnormal involuntary movements(781.0)   . Acne   . Allergic rhinitis   . Bipolar disorder, unspecified ([place])    psych - Dr. Lattie Haw  . Common migraine with intractable migraine 10/09/2018  . Depression   . Diaphragmatic hernia without mention of obstruction or gangrene   . GERD (gastroesophageal reflux disease)    03/18/02, EGD, [facility] Gerd,gastritis, dilated  . History of MRI of brain and brain stem 06/01/03   wnl  . Hypertension   . Other psoriasis   . Tremor of both hands 10/09/2018  . Unspecified sinusitis (chronic)     Family History  Problem Relation Age of Onset  . Arthritis Mother        fibromyalgia  . Alcohol abuse Father   . Emphysema Brother        smoker  . COPD Brother        smoker, continues.  . Arthritis Sister        fibromyalgia  . Cancer Neg Hx   . Coronary artery disease Neg Hx   . Stroke Neg Hx   . Diabetes Neg Hx     Past Surgical History:  Procedure Laterality Date  . COLONOSCOPY  03/18/02   internal hemorrhoids  . COLONOSCOPY  06/2014   diverticulosis, rpt 10 yrs Collene Mares)  . ESOPHAGOGASTRODUODENOSCOPY  1/99   Sharlett Iles; HH,GERD  . ESOPHAGOGASTRODUODENOSCOPY  03/18/02   HH,GERD,Gastritis; Dilated  . LAPAROSCOPIC ESOPHAGOGASTRIC FUNDOPLASTY  08/28/02   [facility] Done   Social History   Occupational History  . Occupation: Housewife  Tobacco Use  . Smoking status: Former Smoker    Packs/day: 1.00    Years: 20.00    Pack years: 20.00    Types: Cigarettes    Quit date: 01/10/1998    Years since quitting: 21.6  . Smokeless tobacco: Never Used  Substance and Sexual Activity  . Alcohol use: No  . Drug use: No  . Sexual activity: Never    Comment: quit sexually with partner approx 1995

## 2019-09-23 ENCOUNTER — Other Ambulatory Visit: Payer: Self-pay | Admitting: Physician Assistant

## 2019-09-25 NOTE — Patient Instructions (Addendum)
DUE TO COVID-19 ONLY ONE VISITOR IS ALLOWED TO COME WITH YOU AND STAY IN THE WAITING ROOM ONLY DURING PRE OP AND PROCEDURE DAY OF SURGERY. THE 1 VISITOR  MAY VISIT WITH YOU AFTER SURGERY IN YOUR PRIVATE ROOM DURING VISITING HOURS ONLY!  YOU NEED TO HAVE A COVID 19 TEST ON: 10/01/19 @ 8:00 am , THIS TEST MUST BE DONE BEFORE SURGERY,  COVID TESTING SITE [place] [address], IT IS ON THE RIGHT GOING OUT [address] APPROXIMATELY  2 MINUTES PAST [employer] ON THE RIGHT. ONCE YOUR COVID TEST IS COMPLETED,  PLEASE BEGIN THE QUARANTINE INSTRUCTIONS AS OUTLINED IN YOUR HANDOUT.                [place]    Your procedure is scheduled on: 10/04/19   Report to [facility] Main  Entrance   Report to admitting at: 9:00 AM     Call this number if you have problems the morning of surgery [phone]    Remember:   NO SOLID FOOD AFTER MIDNIGHT THE NIGHT PRIOR TO SURGERY. NOTHING BY MOUTH EXCEPT CLEAR LIQUIDS UNTIL . PLEASE FINISH ENSURE DRINK PER SURGEON ORDER  WHICH NEEDS TO BE COMPLETED AT : 8:30 AM.  CLEAR LIQUID DIET   Foods Allowed                                                                     Foods Excluded  Coffee and tea, regular and decaf                             liquids that you cannot  Plain Jell-O any favor except red or purple                                           see through such as: Fruit ices (not with fruit pulp)                                     milk, soups, orange juice  Iced Popsicles                                    All solid food Carbonated beverages, regular and diet                                    Cranberry, grape and apple juices Sports drinks like Gatorade Lightly seasoned clear broth or consume(fat free) Sugar, honey syrup  Sample Menu Breakfast                                Lunch  Supper Cranberry juice                    Beef broth                             Chicken broth Jell-O                                     Grape juice                           Apple juice Coffee or tea                        Jell-O                                      Popsicle                                                Coffee or tea                        Coffee or tea  _____________________________________________________________________   BRUSH YOUR TEETH MORNING OF SURGERY AND RINSE YOUR MOUTH OUT, NO CHEWING GUM CANDY OR MINTS.     Take these medicines the morning of surgery with A SIP OF WATER: pantoprazole,bupropion,buspar,citalopram,estradiol,propranolol.                               You may not have any metal on your body including hair pins and              piercings  Do not wear jewelry, make-up, lotions, powders or perfumes, deodorant             Do not wear nail polish on your fingernails.  Do not shave  48 hours prior to surgery.    Do not bring valuables to the hospital. [place].  Contacts, dentures or bridgework may not be worn into surgery.  Leave suitcase in the car. After surgery it may be brought to your room.     Patients discharged the day of surgery will not be allowed to drive home. IF YOU ARE HAVING SURGERY AND GOING HOME THE SAME DAY, YOU MUST HAVE AN ADULT TO DRIVE YOU HOME AND BE WITH YOU FOR 24 HOURS. YOU MAY GO HOME BY TAXI OR UBER OR ORTHERWISE, BUT AN ADULT MUST ACCOMPANY YOU HOME AND STAY WITH YOU FOR 24 HOURS.  Name and phone number of your driver:  Special Instructions: N/A              Please read over the following fact sheets you were given: _____________________________________________________________________       [facility] - Preparing for Surgery Before surgery, you can play an important role.  Because skin is not sterile, your skin needs to be as free of germs as possible.  You can reduce  the number of germs on your skin by washing with CHG (chlorahexidine gluconate)  soap before surgery.  CHG is an antiseptic cleaner which kills germs and bonds with the skin to continue killing germs even after washing. Please DO NOT use if you have an allergy to CHG or antibacterial soaps.  If your skin becomes reddened/irritated stop using the CHG and inform your nurse when you arrive at Short Stay. Do not shave (including legs and underarms) for at least 48 hours prior to the first CHG shower.  You may shave your face/neck. Please follow these instructions carefully:  1.  Shower with CHG Soap the night before surgery and the  morning of Surgery.  2.  If you choose to wash your hair, wash your hair first as usual with your  normal  shampoo.  3.  After you shampoo, rinse your hair and body thoroughly to remove the  shampoo.                           4.  Use CHG as you would any other liquid soap.  You can apply chg directly  to the skin and wash                       Gently with a scrungie or clean washcloth.  5.  Apply the CHG Soap to your body ONLY FROM THE NECK DOWN.   Do not use on face/ open                           Wound or open sores. Avoid contact with eyes, ears mouth and genitals (private parts).                       Wash face,  Genitals (private parts) with your normal soap.             6.  Wash thoroughly, paying special attention to the area where your surgery  will be performed.  7.  Thoroughly rinse your body with warm water from the neck down.  8.  DO NOT shower/wash with your normal soap after using and rinsing off  the CHG Soap.                9.  Pat yourself dry with a clean towel.            10.  Wear clean pajamas.            11.  Place clean sheets on your bed the night of your first shower and do not  sleep with pets. Day of Surgery : Do not apply any lotions/deodorants the morning of surgery.  Please wear clean clothes to the hospital/surgery center.  FAILURE TO FOLLOW THESE INSTRUCTIONS MAY RESULT IN THE CANCELLATION OF YOUR SURGERY PATIENT  SIGNATURE_________________________________  NURSE SIGNATURE__________________________________  ________________________________________________________________________   Katelyn Price  An incentive spirometer is a tool that can help keep your lungs clear and active. This tool measures how well you are filling your lungs with each breath. Taking long deep breaths may help reverse or decrease the chance of developing breathing (pulmonary) problems (especially infection) following:  A long period of time when you are unable to move or be active. BEFORE THE PROCEDURE   If the spirometer includes an indicator to show your best effort, your nurse or respiratory therapist will set it to  a desired goal.  If possible, sit up straight or lean slightly forward. Try not to slouch.  Hold the incentive spirometer in an upright position. INSTRUCTIONS FOR USE  1. Sit on the edge of your bed if possible, or sit up as far as you can in bed or on a chair. 2. Hold the incentive spirometer in an upright position. 3. Breathe out normally. 4. Place the mouthpiece in your mouth and seal your lips tightly around it. 5. Breathe in slowly and as deeply as possible, raising the piston or the ball toward the top of the column. 6. Hold your breath for 3-5 seconds or for as long as possible. Allow the piston or ball to fall to the bottom of the column. 7. Remove the mouthpiece from your mouth and breathe out normally. 8. Rest for a few seconds and repeat Steps 1 through 7 at least 10 times every 1-2 hours when you are awake. Take your time and take a few normal breaths between deep breaths. 9. The spirometer may include an indicator to show your best effort. Use the indicator as a goal to work toward during each repetition. 10. After each set of 10 deep breaths, practice coughing to be sure your lungs are clear. If you have an incision (the cut made at the time of surgery), support your incision when coughing  by placing a pillow or rolled up towels firmly against it. Once you are able to get out of bed, walk around indoors and cough well. You may stop using the incentive spirometer when instructed by your caregiver.  RISKS AND COMPLICATIONS  Take your time so you do not get dizzy or light-headed.  If you are in pain, you may need to take or ask for pain medication before doing incentive spirometry. It is harder to take a deep breath if you are having pain. AFTER USE  Rest and breathe slowly and easily.  It can be helpful to keep track of a log of your progress. Your caregiver can provide you with a simple table to help with this. If you are using the spirometer at home, follow these instructions: [place] IF:   You are having difficultly using the spirometer.  You have trouble using the spirometer as often as instructed.  Your pain medication is not giving enough relief while using the spirometer.  You develop fever of 100.5 F (38.1 C) or higher. SEEK IMMEDIATE MEDICAL CARE IF:   You cough up bloody sputum that had not been present before.  You develop fever of 102 F (38.9 C) or greater.  You develop worsening pain at or near the incision site. MAKE SURE YOU:   Understand these instructions.  Will watch your condition.  Will get help right away if you are not doing well or get worse. Document Released: 05/09/2006 Document Revised: 03/21/2011 Document Reviewed: 07/10/2006 [facility] Patient Information 2014 [place], [place].   ________________________________________________________________________

## 2019-09-26 ENCOUNTER — Encounter (HOSPITAL_COMMUNITY): Payer: Self-pay

## 2019-09-26 ENCOUNTER — Other Ambulatory Visit: Payer: Self-pay | Admitting: Physician Assistant

## 2019-09-26 ENCOUNTER — Encounter (HOSPITAL_COMMUNITY)
Admission: RE | Admit: 2019-09-26 | Discharge: 2019-09-26 | Disposition: A | Discharge status: Home / Self Care | Payer: 59 | Source: Ambulatory Visit | Attending: Orthopaedic Surgery | Admitting: Orthopaedic Surgery

## 2019-09-26 ENCOUNTER — Other Ambulatory Visit: Payer: Self-pay

## 2019-09-26 DIAGNOSIS — Z01818 Encounter for other preprocedural examination: Secondary | ICD-10-CM | POA: Diagnosis not present

## 2019-09-26 HISTORY — DX: Unspecified asthma, uncomplicated: J45.909

## 2019-09-26 HISTORY — DX: Unspecified osteoarthritis, unspecified site: M19.90

## 2019-09-26 LAB — CBC
HCT: 38 % (ref 36.0–46.0)
Hemoglobin: 11.7 g/dL — ABNORMAL LOW (ref 12.0–15.0)
MCH: 33.9 pg (ref 26.0–34.0)
MCHC: 30.8 g/dL (ref 30.0–36.0)
MCV: 110.1 fL — ABNORMAL HIGH (ref 80.0–100.0)
Platelets: 320 10*3/uL (ref 150–400)
RBC: 3.45 MIL/uL — ABNORMAL LOW (ref 3.87–5.11)
RDW: 13.4 % (ref 11.5–15.5)
WBC: 11 10*3/uL — ABNORMAL HIGH (ref 4.0–10.5)
nRBC: 0 % (ref 0.0–0.2)

## 2019-09-26 LAB — BASIC METABOLIC PANEL
Anion gap: 6 (ref 5–15)
BUN: 13 mg/dL (ref 6–20)
CO2: 21 mmol/L — ABNORMAL LOW (ref 22–32)
Calcium: 10.5 mg/dL — ABNORMAL HIGH (ref 8.9–10.3)
Chloride: 106 mmol/L (ref 98–111)
Creatinine, Ser: 0.76 mg/dL (ref 0.44–1.00)
GFR calc Af Amer: >60 mL/min (ref >60)
GFR calc non Af Amer: >60 mL/min (ref >60)
Glucose, Bld: 106 mg/dL — ABNORMAL HIGH (ref 70–99)
Potassium: 4.7 mmol/L (ref 3.5–5.1)
Sodium: 133 mmol/L — ABNORMAL LOW (ref 135–145)

## 2019-09-26 LAB — SURGICAL PCR SCREEN
MRSA, PCR: NEGATIVE
Staphylococcus aureus: NEGATIVE

## 2019-09-26 NOTE — Progress Notes (Signed)
COVID Vaccine Completed: YES Date COVID Vaccine completed: 05/12/19 COVID vaccine manufacturer:  [employer]   PCP - Dr. Ria Bush. LOV: 08/19/19 Cardiologist -   Chest x-ray -  EKG -  Stress Test -  ECHO -  Cardiac Cath -  Pacemaker/ICD device last checked:  Sleep Study -  CPAP -   Fasting Blood Sugar -  Checks Blood Sugar _____ times a day  Blood Thinner Instructions: Aspirin Instructions: Last Dose:  Anesthesia review:   Patient denies shortness of breath, fever, cough and chest pain at PAT appointment   Patient verbalized understanding of instructions that were given to them at the PAT appointment. Patient was also instructed that they will need to review over the PAT instructions again at home before surgery.

## 2019-09-27 ENCOUNTER — Other Ambulatory Visit: Payer: Self-pay

## 2019-09-27 NOTE — Telephone Encounter (Signed)
Pls advise.  

## 2019-10-01 ENCOUNTER — Other Ambulatory Visit (HOSPITAL_COMMUNITY)
Admission: RE | Admit: 2019-10-01 | Discharge: 2019-10-01 | Disposition: A | Discharge status: Home / Self Care | Payer: 59 | Source: Ambulatory Visit | Attending: Orthopaedic Surgery | Admitting: Orthopaedic Surgery

## 2019-10-01 DIAGNOSIS — Z20822 Contact with and (suspected) exposure to covid-19: Secondary | ICD-10-CM | POA: Diagnosis not present

## 2019-10-01 DIAGNOSIS — Z01812 Encounter for preprocedural laboratory examination: Secondary | ICD-10-CM | POA: Diagnosis not present

## 2019-10-01 LAB — SARS CORONAVIRUS 2 (TAT 6-24 HRS): SARS Coronavirus 2: NEGATIVE

## 2019-10-03 ENCOUNTER — Other Ambulatory Visit: Payer: Self-pay | Admitting: Orthopaedic Surgery

## 2019-10-03 MED ORDER — METHOCARBAMOL 500 MG PO TABS: 500.0000 mg | ORAL_TABLET | Freq: Four times a day (QID) | ORAL | 1 refills | Status: DC | PRN

## 2019-10-03 MED ORDER — OXYCODONE HCL 5 MG PO TABS: 5.0000 mg | ORAL_TABLET | ORAL | 0 refills | Status: DC | PRN

## 2019-10-03 MED ORDER — ONDANSETRON 4 MG PO TBDP: 4.0000 mg | ORAL_TABLET | Freq: Three times a day (TID) | ORAL | 0 refills | Status: DC | PRN

## 2019-10-03 NOTE — H&P (Signed)
TOTAL HIP ADMISSION H&P  Patient is admitted for right total hip arthroplasty.  Subjective:  Chief Complaint: right hip pain  HPI: Katelyn Price, 59 y.o. female, has a history of pain and functional disability in the right hip(s) due to arthritis and patient has failed non-surgical conservative treatments for greater than 12 weeks to include NSAID's and/or analgesics, flexibility and strengthening excercises, weight reduction as appropriate and activity modification.  Onset of symptoms was gradual starting 1 years ago with gradually worsening course since that time.The patient noted no past surgery on the right hip(s).  Patient currently rates pain in the right hip at 10 out of 10 with activity. Patient has night pain, worsening of pain with activity and weight bearing, pain that interfers with activities of daily living and pain with passive range of motion. Patient has evidence of subchondral sclerosis, periarticular osteophytes and joint space narrowing by imaging studies. This condition presents safety issues increasing the risk of falls.  There is no current active infection.  Patient Active Problem List   Diagnosis Date Noted  . Unilateral primary osteoarthritis, right hip 10/03/2019  . Displaced fracture of proximal third of navicular bone of right wrist with delayed healing 12/10/2018  . Right hip pain 12/10/2018  . Common migraine with intractable migraine 10/09/2018  . Hyperkalemia 08/15/2018  . Hyponatremia 08/15/2018  . Multiple thyroid nodules 10/14/2015  . Graves disease 08/25/2015  . Hypercalcemia 07/28/2013  . B12 deficiency 09/05/2012  . Renal insufficiency 07/27/2012  . Healthcare maintenance 06/23/2011  . ALLERGIC RHINITIS, SEASONAL 04/10/2009  . Drug-induced tremor 07/15/2008  . Impaired fasting glucose 06/07/2007  . PSORIASIS NEC 06/01/2006  . Bipolar disorder ([place]) 05/31/2006  . HYPERTENSION, BENIGN ESSENTIAL 05/31/2006  . GERD (gastroesophageal reflux disease)  05/31/2006  . Hiatal hernia 05/31/2006   Past Medical History:  Diagnosis Date  . Abnormal involuntary movements(781.0)   . Acne   . Allergic rhinitis   . Arthritis   . Asthma   . Bipolar disorder, unspecified ([place])    psych - Dr. Lattie Haw  . Common migraine with intractable migraine 10/09/2018  . Depression   . Diaphragmatic hernia without mention of obstruction or gangrene   . GERD (gastroesophageal reflux disease)    03/18/02, EGD, [facility] Gerd,gastritis, dilated  . History of MRI of brain and brain stem 06/01/03   wnl  . Hypertension   . Other psoriasis   . Tremor of both hands 10/09/2018  . Unspecified sinusitis (chronic)     Past Surgical History:  Procedure Laterality Date  . COLONOSCOPY  03/18/02   internal hemorrhoids  . COLONOSCOPY  06/2014   diverticulosis, rpt 10 yrs Collene Mares)  . ESOPHAGOGASTRODUODENOSCOPY  1/99   Sharlett Iles; HH,GERD  . ESOPHAGOGASTRODUODENOSCOPY  03/18/02   HH,GERD,Gastritis; Dilated  . LAPAROSCOPIC ESOPHAGOGASTRIC FUNDOPLASTY  08/28/02   [name]    No current facility-administered medications for this encounter.   Current Outpatient Medications  Medication Sig Dispense Refill Last Dose  . buPROPion (WELLBUTRIN XL) 150 MG 24 hr tablet Take 2 tablets (300 mg total) by mouth daily.     . busPIRone (BUSPAR) 7.5 MG tablet Take 7.5 mg by mouth 3 (three) times daily.     . Ca & Phos-Vit D-Mag (CALCIUM) [phone] TABS Take 1 tablet by mouth in the morning, at noon, and at bedtime.      . Cholecalciferol (VITAMIN D3) 125 MCG (5000 UT) CAPS Take 5,000 Units by mouth daily.      . citalopram (CELEXA) 20 MG tablet  Take 1 tablet (20 mg total) by mouth daily.     . Cyanocobalamin (VITAMELTS ENERGY VITAMIN B-12) 1500 MCG TBDP Take 1 tablet by mouth every Monday, Wednesday, and Friday.     . estradiol (ESTRACE) 2 MG tablet Take 2 mg by mouth daily.     . ferrous sulfate 324 (65 Fe) MG TBEC Take 1 tablet (325 mg total) by mouth daily. (Patient taking differently: Take  1 tablet by mouth 3 (three) times a week. ) 30 tablet    . lithium carbonate (ESKALITH) 450 MG CR tablet Take 450 mg by mouth at bedtime.      Marland Kitchen losartan (COZAAR) 25 MG tablet Take 1 tablet (25 mg total) by mouth daily. 90 tablet 3   . methimazole (TAPAZOLE) 5 MG tablet TAKE 1/2 TABLET BY MOUTH EVERY DAY (Patient taking differently: Take 2.5 mg by mouth at bedtime. ) 90 tablet 3   . pantoprazole (PROTONIX) 40 MG tablet Take 1 tablet (40 mg total) by mouth daily. 90 tablet 3   . progesterone (PROMETRIUM) 200 MG capsule Take 200 mg by mouth at bedtime.      . propranolol ER (INDERAL LA) 120 MG 24 hr capsule Take 120 mg by mouth at bedtime.      . tretinoin (RETIN-A) 0.05 % cream Apply 1 application topically daily as needed (blemishes).      . ziprasidone (GEODON) 80 MG capsule Take 240 mg by mouth at bedtime.      . Acetaminophen-Codeine 300-30 MG tablet TAKE 1 TABLET BY MOUTH EVERY 4 (FOUR) HOURS AS NEEDED FOR MODERATE PAIN. 20 tablet 0   . amLODipine (NORVASC) 5 MG tablet Take 5 mg by mouth daily. (Patient not taking: Reported on 09/25/2019)   Not Taking at Unknown time  . methocarbamol (ROBAXIN) 500 MG tablet Take 1 tablet (500 mg total) by mouth every 6 (six) hours as needed for muscle spasms. 40 tablet 1   . ondansetron (ZOFRAN ODT) 4 MG disintegrating tablet Take 1 tablet (4 mg total) by mouth every 8 (eight) hours as needed for nausea or vomiting. 20 tablet 0   . oxyCODONE (ROXICODONE) 5 MG immediate release tablet Take 1 tablet (5 mg total) by mouth every 4 (four) hours as needed for severe pain. 30 tablet 0   . polyethylene glycol powder (GLYCOLAX/MIRALAX) 17 GM/SCOOP powder Take 17 g by mouth daily as needed for moderate constipation. (Patient not taking: Reported on 09/25/2019) 3350 g 1 Not Taking at Unknown time  . progesterone (PROMETRIUM) 200 MG capsule Take 1 capsule (200 mg total) by mouth at bedtime. (Patient not taking: Reported on 09/25/2019)   Not Taking at Unknown time  . psyllium  (METAMUCIL SMOOTH TEXTURE) 58.6 % powder Take 1 packet by mouth daily. (Patient not taking: Reported on 09/25/2019)   Not Taking at Unknown time   Allergies  Allergen Reactions  . Amlodipine Swelling    Ankle swelling  . Hydrocodone-Acetaminophen     Pt says it does not work for her  . Minocycline Hcl Itching    Social History   Tobacco Use  . Smoking status: Former Smoker    Packs/day: 1.00    Years: 20.00    Pack years: 20.00    Types: Cigarettes    Quit date: 01/10/1998    Years since quitting: 21.7  . Smokeless tobacco: Never Used  Substance Use Topics  . Alcohol use: No    Family History  Problem Relation Age of Onset  . Arthritis Mother  fibromyalgia  . Alcohol abuse Father   . Emphysema Brother        smoker  . COPD Brother        smoker, continues.  . Arthritis Sister        fibromyalgia  . Cancer Neg Hx   . Coronary artery disease Neg Hx   . Stroke Neg Hx   . Diabetes Neg Hx      Review of Systems  All other systems reviewed and are negative.   Objective:  Physical Exam Vitals reviewed.  Constitutional:      Appearance: Normal appearance.  HENT:     Head: Normocephalic and atraumatic.  Eyes:     Extraocular Movements: Extraocular movements intact.     Pupils: Pupils are equal, round, and reactive to light.  Cardiovascular:     Rate and Rhythm: Normal rate and regular rhythm.  Pulmonary:     Effort: Pulmonary effort is normal.     Breath sounds: Normal breath sounds.  Abdominal:     Palpations: Abdomen is soft.  Musculoskeletal:     Cervical back: Normal range of motion.     Right hip: Tenderness and bony tenderness present. Decreased range of motion. Decreased strength.  Neurological:     Mental Status: She is alert and oriented to person, place, and time.  Psychiatric:        Behavior: Behavior normal.     Vital signs in last 24 hours:    Labs:   Estimated body mass index is 31.32 kg/m as calculated from the following:    Height as of 09/26/19: 5\' 2"  (1.575 m).   Weight as of 08/19/19: 77.7 kg.   Imaging Review Plain radiographs demonstrate severe degenerative joint disease of the right hip(s). The bone quality appears to be excellent for age and reported activity level.      Assessment/Plan:  End stage arthritis, right hip(s)  The patient history, physical examination, clinical judgement of the provider and imaging studies are consistent with end stage degenerative joint disease of the right hip(s) and total hip arthroplasty is deemed medically necessary. The treatment options including medical management, injection therapy, arthroscopy and arthroplasty were discussed at length. The risks and benefits of total hip arthroplasty were presented and reviewed. The risks due to aseptic loosening, infection, stiffness, dislocation/subluxation,  thromboembolic complications and other imponderables were discussed.  The patient acknowledged the explanation, agreed to proceed with the plan and consent was signed. Patient is being admitted for inpatient treatment for surgery, pain control, PT, OT, prophylactic antibiotics, VTE prophylaxis, progressive ambulation and ADL's and discharge planning.The patient is planning to be discharged home with home health services

## 2019-10-04 ENCOUNTER — Ambulatory Visit (HOSPITAL_COMMUNITY): Payer: 59

## 2019-10-04 ENCOUNTER — Encounter (HOSPITAL_COMMUNITY)
Admission: RE | Disposition: A | Discharge status: Home / Self Care | Payer: Self-pay | Source: Home / Self Care | Attending: Orthopaedic Surgery

## 2019-10-04 ENCOUNTER — Encounter (HOSPITAL_COMMUNITY): Payer: Self-pay | Admitting: Orthopaedic Surgery

## 2019-10-04 ENCOUNTER — Ambulatory Visit (HOSPITAL_COMMUNITY)
Admission: RE | Admit: 2019-10-04 | Discharge: 2019-10-04 | Disposition: A | Discharge status: Home / Self Care | Payer: 59 | Attending: Orthopaedic Surgery | Admitting: Orthopaedic Surgery

## 2019-10-04 ENCOUNTER — Ambulatory Visit (HOSPITAL_COMMUNITY): Payer: 59 | Admitting: Certified Registered Nurse Anesthetist

## 2019-10-04 DIAGNOSIS — F319 Bipolar disorder, unspecified: Secondary | ICD-10-CM | POA: Insufficient documentation

## 2019-10-04 DIAGNOSIS — M1611 Unilateral primary osteoarthritis, right hip: Secondary | ICD-10-CM | POA: Insufficient documentation

## 2019-10-04 DIAGNOSIS — Z79899 Other long term (current) drug therapy: Secondary | ICD-10-CM | POA: Diagnosis not present

## 2019-10-04 DIAGNOSIS — Z96641 Presence of right artificial hip joint: Secondary | ICD-10-CM

## 2019-10-04 DIAGNOSIS — K219 Gastro-esophageal reflux disease without esophagitis: Secondary | ICD-10-CM | POA: Diagnosis not present

## 2019-10-04 DIAGNOSIS — Z87891 Personal history of nicotine dependence: Secondary | ICD-10-CM | POA: Insufficient documentation

## 2019-10-04 DIAGNOSIS — E538 Deficiency of other specified B group vitamins: Secondary | ICD-10-CM | POA: Diagnosis not present

## 2019-10-04 DIAGNOSIS — I10 Essential (primary) hypertension: Secondary | ICD-10-CM | POA: Insufficient documentation

## 2019-10-04 DIAGNOSIS — Z79890 Hormone replacement therapy (postmenopausal): Secondary | ICD-10-CM | POA: Insufficient documentation

## 2019-10-04 DIAGNOSIS — E0500 Thyrotoxicosis with diffuse goiter without thyrotoxic crisis or storm: Secondary | ICD-10-CM | POA: Insufficient documentation

## 2019-10-04 DIAGNOSIS — Z419 Encounter for procedure for purposes other than remedying health state, unspecified: Secondary | ICD-10-CM

## 2019-10-04 HISTORY — PX: TOTAL HIP ARTHROPLASTY: SHX124

## 2019-10-04 LAB — PREGNANCY, URINE: Preg Test, Ur: NEGATIVE

## 2019-10-04 SURGERY — ARTHROPLASTY, HIP, TOTAL, ANTERIOR APPROACH
Anesthesia: Spinal | Site: Hip | Laterality: Right

## 2019-10-04 MED ORDER — 0.9 % SODIUM CHLORIDE (POUR BTL) OPTIME
TOPICAL | Status: DC | PRN
  Administered 2019-10-04: 1000 mL

## 2019-10-04 MED ORDER — LACTATED RINGERS IV BOLUS: 250.0000 mL | Freq: Once | INTRAVENOUS | Status: DC

## 2019-10-04 MED ORDER — METHOCARBAMOL 500 MG IVPB - SIMPLE MED: 500.0000 mg | Freq: Four times a day (QID) | INTRAVENOUS | Status: DC | PRN

## 2019-10-04 MED ORDER — LACTATED RINGERS IV SOLN
INTRAVENOUS | Status: DC
  Administered 2019-10-04: 1000 mL via INTRAVENOUS

## 2019-10-04 MED ORDER — HYDROMORPHONE HCL 1 MG/ML IJ SOLN: 0.2500 mg | INTRAMUSCULAR | Status: DC | PRN

## 2019-10-04 MED ORDER — STERILE WATER FOR IRRIGATION IR SOLN
Status: DC | PRN
  Administered 2019-10-04: 2000 mL

## 2019-10-04 MED ORDER — EPHEDRINE SULFATE-NACL 50-0.9 MG/10ML-% IV SOSY
PREFILLED_SYRINGE | INTRAVENOUS | Status: DC | PRN
  Administered 2019-10-04: 10 mg via INTRAVENOUS

## 2019-10-04 MED ORDER — CEFAZOLIN SODIUM-DEXTROSE 1-4 GM/50ML-% IV SOLN: 1.0000 g | Freq: Once | INTRAVENOUS | Status: DC

## 2019-10-04 MED ORDER — PHENYLEPHRINE HCL-NACL 10-0.9 MG/250ML-% IV SOLN
INTRAVENOUS | Status: DC | PRN
  Administered 2019-10-04: 40 ug/min via INTRAVENOUS

## 2019-10-04 MED ORDER — SODIUM CHLORIDE 0.9 % IR SOLN
Status: DC | PRN
  Administered 2019-10-04: 1000 mL

## 2019-10-04 MED ORDER — ORAL CARE MOUTH RINSE: 15.0000 mL | Freq: Once | OROMUCOSAL | Status: AC

## 2019-10-04 MED ORDER — FENTANYL CITRATE (PF) 100 MCG/2ML IJ SOLN
INTRAMUSCULAR | Status: DC | PRN
Start: 2019-10-04 — End: 2019-10-04
  Administered 2019-10-04: 25 ug via INTRAVENOUS
  Administered 2019-10-04: 50 ug via INTRAVENOUS

## 2019-10-04 MED ORDER — BUPIVACAINE IN DEXTROSE 0.75-8.25 % IT SOLN
INTRATHECAL | Status: DC | PRN
  Administered 2019-10-04: 1.4 mL via INTRATHECAL

## 2019-10-04 MED ORDER — LACTATED RINGERS IV BOLUS
500.0000 mL | Freq: Once | INTRAVENOUS | Status: AC
  Administered 2019-10-04: 500 mL via INTRAVENOUS

## 2019-10-04 MED ORDER — ONDANSETRON HCL 4 MG/2ML IJ SOLN
INTRAMUSCULAR | Status: AC
  Filled 2019-10-04: qty 2

## 2019-10-04 MED ORDER — CHLORHEXIDINE GLUCONATE 0.12 % MT SOLN
15.0000 mL | Freq: Once | OROMUCOSAL | Status: AC
  Administered 2019-10-04: 15 mL via OROMUCOSAL

## 2019-10-04 MED ORDER — METHOCARBAMOL 500 MG PO TABS: 500.0000 mg | ORAL_TABLET | Freq: Four times a day (QID) | ORAL | Status: DC | PRN

## 2019-10-04 MED ORDER — POVIDONE-IODINE 10 % EX SWAB
2.0000 "application " | Freq: Once | CUTANEOUS | Status: AC
  Administered 2019-10-04: 2 via TOPICAL

## 2019-10-04 MED ORDER — PROPOFOL 500 MG/50ML IV EMUL
INTRAVENOUS | Status: DC | PRN
  Administered 2019-10-04: 50 ug/kg/min via INTRAVENOUS

## 2019-10-04 MED ORDER — PROPOFOL 500 MG/50ML IV EMUL
INTRAVENOUS | Status: AC
  Filled 2019-10-04: qty 50

## 2019-10-04 MED ORDER — MIDAZOLAM HCL 5 MG/5ML IJ SOLN
INTRAMUSCULAR | Status: DC | PRN
  Administered 2019-10-04: 2 mg via INTRAVENOUS

## 2019-10-04 MED ORDER — CEFAZOLIN SODIUM-DEXTROSE 2-4 GM/100ML-% IV SOLN
2.0000 g | INTRAVENOUS | Status: AC
  Administered 2019-10-04: 2 g via INTRAVENOUS
  Filled 2019-10-04: qty 100

## 2019-10-04 MED ORDER — PROPOFOL 10 MG/ML IV BOLUS
INTRAVENOUS | Status: AC
  Filled 2019-10-04: qty 20

## 2019-10-04 MED ORDER — GLYCOPYRROLATE PF 0.2 MG/ML IJ SOSY
PREFILLED_SYRINGE | INTRAMUSCULAR | Status: DC | PRN
  Administered 2019-10-04: .2 mg via INTRAVENOUS

## 2019-10-04 MED ORDER — TRANEXAMIC ACID-NACL 1000-0.7 MG/100ML-% IV SOLN
1000.0000 mg | INTRAVENOUS | Status: AC
  Administered 2019-10-04: 1000 mg via INTRAVENOUS
  Filled 2019-10-04: qty 100

## 2019-10-04 MED ORDER — GLYCOPYRROLATE PF 0.2 MG/ML IJ SOSY
PREFILLED_SYRINGE | INTRAMUSCULAR | Status: AC
  Filled 2019-10-04: qty 1

## 2019-10-04 MED ORDER — MIDAZOLAM HCL 2 MG/2ML IJ SOLN
INTRAMUSCULAR | Status: AC
  Filled 2019-10-04: qty 2

## 2019-10-04 MED ORDER — FENTANYL CITRATE (PF) 100 MCG/2ML IJ SOLN
INTRAMUSCULAR | Status: AC
  Filled 2019-10-04: qty 2

## 2019-10-04 MED ORDER — PHENYLEPHRINE HCL (PRESSORS) 10 MG/ML IV SOLN
INTRAVENOUS | Status: AC
  Filled 2019-10-04: qty 1

## 2019-10-04 MED ORDER — EPHEDRINE 5 MG/ML INJ
INTRAVENOUS | Status: AC
  Filled 2019-10-04: qty 10

## 2019-10-04 MED ORDER — DEXAMETHASONE SODIUM PHOSPHATE 10 MG/ML IJ SOLN
INTRAMUSCULAR | Status: DC | PRN
  Administered 2019-10-04: 8 mg via INTRAVENOUS

## 2019-10-04 MED ORDER — PROPOFOL 10 MG/ML IV BOLUS
INTRAVENOUS | Status: DC | PRN
  Administered 2019-10-04: 10 mg via INTRAVENOUS
  Administered 2019-10-04 (×2): 20 mg via INTRAVENOUS
  Administered 2019-10-04: 10 mg via INTRAVENOUS

## 2019-10-04 MED ORDER — DEXAMETHASONE SODIUM PHOSPHATE 10 MG/ML IJ SOLN
INTRAMUSCULAR | Status: AC
  Filled 2019-10-04: qty 1

## 2019-10-04 MED ORDER — ONDANSETRON HCL 4 MG/2ML IJ SOLN
INTRAMUSCULAR | Status: DC | PRN
  Administered 2019-10-04: 4 mg via INTRAVENOUS

## 2019-10-04 SURGICAL SUPPLY — 45 items
APL SKNCLS STERI-STRIP NONHPOA (GAUZE/BANDAGES/DRESSINGS)
BAG SPEC THK2 15X12 ZIP CLS (MISCELLANEOUS)
BAG ZIPLOCK 12X15 (MISCELLANEOUS) IMPLANT
BENZOIN TINCTURE PRP APPL 2/3 (GAUZE/BANDAGES/DRESSINGS) IMPLANT
BLADE SAW SGTL 18X1.27X75 (BLADE) ×2 IMPLANT
COVER PERINEAL POST (MISCELLANEOUS) ×2 IMPLANT
COVER SURGICAL LIGHT HANDLE (MISCELLANEOUS) ×2 IMPLANT
COVER WAND RF STERILE (DRAPES) ×2 IMPLANT
CUP SECTOR GRIPTON 50MM (Cup) ×2 IMPLANT
DRAPE STERI IOBAN 125X83 (DRAPES) ×2 IMPLANT
DRAPE U-SHAPE 47X51 STRL (DRAPES) ×4 IMPLANT
DRESSING AQUACEL AG SP 3.5X10 (GAUZE/BANDAGES/DRESSINGS) ×1 IMPLANT
DRSG AQUACEL AG ADV 3.5X10 (GAUZE/BANDAGES/DRESSINGS) ×2 IMPLANT
DRSG AQUACEL AG SP 3.5X10 (GAUZE/BANDAGES/DRESSINGS) ×2
DRSG CURAD 3X16 NADH (PACKING) ×2 IMPLANT
DURAPREP 26ML APPLICATOR (WOUND CARE) ×2 IMPLANT
ELECT REM PT RETURN 15FT ADLT (MISCELLANEOUS) ×2 IMPLANT
GAUZE XEROFORM 1X8 LF (GAUZE/BANDAGES/DRESSINGS) ×2 IMPLANT
GLOVE BIO SURGEON STRL SZ7.5 (GLOVE) ×2 IMPLANT
GLOVE BIOGEL PI IND STRL 8 (GLOVE) ×2 IMPLANT
GLOVE BIOGEL PI INDICATOR 8 (GLOVE) ×2
GLOVE ECLIPSE 8.0 STRL XLNG CF (GLOVE) ×2 IMPLANT
GOWN STRL REUS W/TWL XL LVL3 (GOWN DISPOSABLE) ×4 IMPLANT
HANDPIECE INTERPULSE COAX TIP (DISPOSABLE) ×2
HEAD M SROM 36MM PLUS 1.5 (Hips) ×1 IMPLANT
HOLDER FOLEY CATH W/STRAP (MISCELLANEOUS) ×2 IMPLANT
KIT TURNOVER KIT A (KITS) IMPLANT
LINER ACETAB NEUTRAL 36ID 520D (Liner) ×2 IMPLANT
PACK ANTERIOR HIP CUSTOM (KITS) ×2 IMPLANT
PENCIL SMOKE EVACUATOR (MISCELLANEOUS) IMPLANT
PIN SECTOR W/GRIP ACE CUP 52MM (Hips) ×2 IMPLANT
SCREW 6.5MMX25MM (Screw) ×2 IMPLANT
SET HNDPC FAN SPRY TIP SCT (DISPOSABLE) ×1 IMPLANT
SROM M HEAD 36MM PLUS 1.5 (Hips) ×2 IMPLANT
STAPLER VISISTAT 35W (STAPLE) IMPLANT
STEM FEM ACTIS STD SZ2 (Stem) ×2 IMPLANT
STRIP CLOSURE SKIN 1/2X4 (GAUZE/BANDAGES/DRESSINGS) IMPLANT
SUT ETHIBOND NAB CT1 #1 30IN (SUTURE) ×2 IMPLANT
SUT ETHILON 2 0 PS N (SUTURE) IMPLANT
SUT MNCRL AB 4-0 PS2 18 (SUTURE) IMPLANT
SUT VIC AB 0 CT1 36 (SUTURE) ×2 IMPLANT
SUT VIC AB 1 CT1 36 (SUTURE) ×2 IMPLANT
SUT VIC AB 2-0 CT1 27 (SUTURE) ×4
SUT VIC AB 2-0 CT1 TAPERPNT 27 (SUTURE) ×2 IMPLANT
TRAY FOLEY MTR SLVR 16FR STAT (SET/KITS/TRAYS/PACK) IMPLANT

## 2019-10-04 NOTE — Transfer of Care (Signed)
Immediate Anesthesia Transfer of Care Note  Patient: Katelyn Price  Procedure(s) Performed: RIGHT TOTAL HIP ARTHROPLASTY ANTERIOR APPROACH (Right Hip)  Patient Location: PACU  Anesthesia Type:Spinal  Level of Consciousness: drowsy and patient cooperative  Airway & Oxygen Therapy: Patient Spontanous Breathing and Patient connected to face mask oxygen  Post-op Assessment: Report given to RN and Post -op Vital signs reviewed and stable  Post vital signs: Reviewed and stable  Last Vitals:  Vitals Value Taken Time  BP 108/74 10/04/19 1227  Temp    Pulse 78 10/04/19 1229  Resp 19 10/04/19 1229  SpO2 100 % 10/04/19 1229  Vitals shown include unvalidated device data.  Last Pain:  Vitals:   10/04/19 0908  TempSrc: Oral         Complications: No complications documented.

## 2019-10-04 NOTE — Anesthesia Procedure Notes (Signed)
Spinal  Patient location during procedure: OR Start time: 10/04/2019 10:46 AM End time: 10/04/2019 10:51 AM Staffing Performed: resident/CRNA  Anesthesiologist: Myrtie Soman, MD Resident/CRNA: Montel Clock, CRNA Preanesthetic Checklist Completed: patient identified, IV checked, risks and benefits discussed, surgical consent, monitors and equipment checked, pre-op evaluation and timeout performed Spinal Block Patient position: sitting Prep: DuraPrep Patient monitoring: heart rate, cardiac monitor, continuous pulse ox and blood pressure Approach: midline Location: L3-4 Injection technique: single-shot Needle Needle type: Pencan  Needle gauge: 24 G Needle length: 10 cm Needle insertion depth: 7 cm Assessment Sensory level: T6

## 2019-10-04 NOTE — Anesthesia Preprocedure Evaluation (Signed)
Anesthesia Evaluation  Patient identified by MRN, date of birth, ID band Patient awake    Reviewed: Allergy & Precautions, NPO status , Patient's Chart, lab work & pertinent test results  Airway Mallampati: II  TM Distance: >3 FB Neck ROM: Full    Dental no notable dental hx.    Pulmonary neg pulmonary ROS, former smoker,    Pulmonary exam normal breath sounds clear to auscultation       Cardiovascular hypertension, Pt. on medications Normal cardiovascular exam Rhythm:Regular Rate:Normal     Neuro/Psych Bipolar Disorder negative neurological ROS     GI/Hepatic Neg liver ROS, GERD  ,  Endo/Other  Hyperthyroidism   Renal/GU negative Renal ROS  negative genitourinary   Musculoskeletal  (+) Arthritis , Osteoarthritis,    Abdominal   Peds negative pediatric ROS (+)  Hematology negative hematology ROS (+)   Anesthesia Other Findings   Reproductive/Obstetrics negative OB ROS                             Anesthesia Physical Anesthesia Plan  ASA: III  Anesthesia Plan: Spinal   Post-op Pain Management:    Induction: Intravenous  PONV Risk Score and Plan: 2 and Ondansetron and Dexamethasone  Airway Management Planned: Simple Face Mask  Additional Equipment:   Intra-op Plan:   Post-operative Plan:   Informed Consent: I have reviewed the patients History and Physical, chart, labs and discussed the procedure including the risks, benefits and alternatives for the proposed anesthesia with the patient or authorized representative who has indicated his/her understanding and acceptance.     Dental advisory given  Plan Discussed with: CRNA and Surgeon  Anesthesia Plan Comments:         Anesthesia Quick Evaluation

## 2019-10-04 NOTE — Progress Notes (Signed)
Office RN Case manager spoke with patient prior to her surgery this week to discuss her Right total hip arthroplasty per Dr. Ninfa Linden. She is aware that she is ambulatory/same day discharge and reviewed all post-op instructions. She has a husband at home that will be able to assist her. She has a FWW, but will need a 3in1/BSC. Order placed with [facility] prior to surgery. Anticipate HHPT will be needed. Choice provided and referral made to [facility]. Please contact Jamse Arn, RN Case Manager for any changes to d/c plan or needs. [phone].

## 2019-10-04 NOTE — Anesthesia Postprocedure Evaluation (Signed)
Anesthesia Post Note  Patient: MADILYNNE MULLAN  Procedure(s) Performed: RIGHT TOTAL HIP ARTHROPLASTY ANTERIOR APPROACH (Right Hip)     Patient location during evaluation: PACU Anesthesia Type: Spinal Level of consciousness: oriented and awake and alert Pain management: pain level controlled Vital Signs Assessment: post-procedure vital signs reviewed and stable Respiratory status: spontaneous breathing, respiratory function stable and patient connected to nasal cannula oxygen Cardiovascular status: blood pressure returned to baseline and stable Postop Assessment: no headache, no backache and no apparent nausea or vomiting Anesthetic complications: no   No complications documented.  Last Vitals:  Vitals:   10/04/19 1330 10/04/19 1339  BP: 140/90 (!) 140/95  Pulse: 69 69  Resp: 14 14  Temp: 36.4 C 36.5 C  SpO2: 98%     Last Pain:  Vitals:   10/04/19 1339  TempSrc: Oral  PainSc:                  Glennis Borger S

## 2019-10-04 NOTE — Discharge Instructions (Signed)

## 2019-10-04 NOTE — Brief Op Note (Signed)
10/04/2019  12:09 PM  PATIENT:  Katelyn Price  59 y.o. female  PRE-OPERATIVE DIAGNOSIS:  right hip osteoarthritis  POST-OPERATIVE DIAGNOSIS:  right hip osteoarthritis  PROCEDURE:  Procedure(s): RIGHT TOTAL HIP ARTHROPLASTY ANTERIOR APPROACH (Right)  SURGEON:  Surgeon(s) and Role:    Mcarthur Rossetti, MD - Primary  PHYSICIAN ASSISTANT:  Benita Stabile, PA-C  ANESTHESIA:   spinal  EBL:  300 mL   COUNTS:  YES  DICTATION: .Other Dictation: Dictation Number [id number]  PLAN OF CARE: Discharge to home after PACU  PATIENT DISPOSITION:  PACU - hemodynamically stable.   Delay start of Pharmacological VTE agent (>24hrs) due to surgical blood loss or risk of bleeding: no

## 2019-10-04 NOTE — Interval H&P Note (Signed)
History and Physical Interval Note: The patient understands that she is here today for a right total hip arthroplasty to treat the pain from her right hip osteoarthritis. There has been no interval change in her medical status. See recent H&P. The risk and benefits of surgery been explained in detail and informed consent is obtained. The right hip has been marked.  10/04/2019 10:10 AM  Katelyn Price  has presented today for surgery, with the diagnosis of right hip osteoarthritis.  The various methods of treatment have been discussed with the patient and family. After consideration of risks, benefits and other options for treatment, the patient has consented to  Procedure(s): RIGHT TOTAL HIP ARTHROPLASTY ANTERIOR APPROACH (Right) as a surgical intervention.  The patient's history has been reviewed, patient examined, no change in status, stable for surgery.  I have reviewed the patient's chart and labs.  Questions were answered to the patient's satisfaction.     Mcarthur Rossetti

## 2019-10-04 NOTE — Evaluation (Signed)
Physical Therapy Evaluation Patient Details Name: Katelyn Price MRN: [id number] DOB: Apr 01, 1960 Today's Date: 10/04/2019   History of Present Illness  Patient is 59 y.o. female s/p Rt THA anterior approach on 10/04/19 with PMH significant for HTN, GERD, Depression, anxiety, OA, asthma, migraine.     Clinical Impression  Katelyn Price is a 59 y.o. female POD 0 s/p RT THA. Patient reports independence with mobility at baseline. Patient is now limited by functional impairments (see PT problem list below) and requires supervision/min guard for transfers and gait with RW. Patient was able to ambulate ~120 feet with RW and supervision and cues for safe walker management. Patient educated on safe sequencing for stair mobility and verbalized safe guarding position for people assisting with mobility. Patient instructed in exercises to facilitate ROM and circulation. Patient will benefit from continued skilled PT interventions to address impairments and progress towards PLOF. Patient has met mobility goals at adequate level for discharge home; will continue to follow if pt continues acute stay to progress towards Mod I goals.     Follow Up Recommendations Follow surgeon's recommendation for DC plan and follow-up therapies;Home health PT    Equipment Recommendations  3in1 (PT)    Recommendations for Other Services       Precautions / Restrictions Precautions Precautions: Fall Restrictions Weight Bearing Restrictions: No Other Position/Activity Restrictions: WBAT      Mobility  Bed Mobility Overal bed mobility: Needs Assistance Bed Mobility: Supine to Sit     Supine to sit: Supervision     General bed mobility comments: pt taking extra time, no assist needed.  Transfers Overall transfer level: Needs assistance Equipment used: Rolling walker (2 wheeled) Transfers: Sit to/from Stand Sit to Stand: Supervision         General transfer comment: cues for hand placement/technique  with RW, no assist and pt steady in standing.  Ambulation/Gait Ambulation/Gait assistance: Supervision Gait Distance (Feet): 120 Feet Assistive device: Rolling walker (2 wheeled) Gait Pattern/deviations: Step-to pattern;Decreased stride length;Decreased step length - left;Decreased step length - right;Decreased weight shift to right Gait velocity: decr   General Gait Details: cues for safe step pattern/proximity to RW, no overt LOB noted.   Stairs Stairs: Yes Stairs assistance: Min guard;Supervision Stair Management: One rail Right;Step to pattern;Sideways Number of Stairs: 6 General stair comments: cues for safe step pattern "up with good, down with bad" and side step technique with one rail. pt verbalized safe understanding of guarding position for family. Pt's husband present at [place] and educated on reverse step up for car transfer as pt will go home in a truck.  Wheelchair Mobility    Modified Rankin (Stroke Patients Only)       Balance Overall balance assessment: Needs assistance Sitting-balance support: Feet supported Sitting balance-Leahy Scale: Good     Standing balance support: During functional activity;Bilateral upper extremity supported Standing balance-Leahy Scale: Fair                  Pertinent Vitals/Pain Pain Assessment: No/denies pain    Home Living Family/patient expects to be discharged to:: Private residence Living Arrangements: Spouse/significant other Available Help at Discharge: Family Type of Home: House Home Access: Stairs to enter Entrance Stairs-Rails: Right Entrance Stairs-Number of Steps: 6 Home Layout: One level Home Equipment: Walker - 2 wheels      Prior Function Level of Independence: Independent               Hand Dominance   Dominant Hand:  Right    Extremity/Trunk Assessment   Upper Extremity Assessment Upper Extremity Assessment: Overall WFL for tasks assessed    Lower Extremity Assessment Lower Extremity  Assessment: Overall WFL for tasks assessed    Cervical / Trunk Assessment Cervical / Trunk Assessment: Kyphotic  Communication   Communication: No difficulties  Cognition Arousal/Alertness: Awake/alert Behavior During Therapy: WFL for tasks assessed/performed Overall Cognitive Status: Within Functional Limits for tasks assessed                   General Comments      Exercises Total Joint Exercises Ankle Circles/Pumps: AROM;Both;Seated;20 reps Quad Sets: AROM;Right;Seated;10 reps Short Arc Quad: AROM;Right;Seated;10 reps Heel Slides: AROM;Right;Seated;10 reps   Assessment/Plan    PT Assessment Patient needs continued PT services  PT Problem List Decreased range of motion;Decreased strength;Decreased activity tolerance;Decreased balance;Decreased mobility;Decreased knowledge of use of DME;Decreased knowledge of precautions       PT Treatment Interventions DME instruction;Gait training;Stair training;Functional mobility training;Therapeutic activities;Therapeutic exercise;Balance training;Patient/family education    PT Goals (Current goals can be found in the Care Plan section)  Acute Rehab PT Goals Patient Stated Goal: get home and start using stationary bike PT Goal Formulation: With patient Time For Goal Achievement: 10/11/19 Potential to Achieve Goals: Good    Frequency 7X/week   Barriers to discharge        Co-evaluation               AM-PAC PT "6 Clicks" Mobility  Outcome Measure Help needed turning from your back to your side while in a flat bed without using bedrails?: None Help needed moving from lying on your back to sitting on the side of a flat bed without using bedrails?: None Help needed moving to and from a bed to a chair (including a wheelchair)?: A Little Help needed standing up from a chair using your arms (e.g., wheelchair or bedside chair)?: A Little Help needed to walk in hospital room?: A Little Help needed climbing 3-5 steps with a  railing? : A Little 6 Click Score: 20    End of Session Equipment Utilized During Treatment: Gait belt Activity Tolerance: Patient tolerated treatment well Patient left: in chair;with call bell/phone within reach;with family/visitor present Nurse Communication: Mobility status PT Visit Diagnosis: Muscle weakness (generalized) (M62.81);Difficulty in walking, not elsewhere classified (R26.2)    Time: 4239-5320 PT Time Calculation (min) (ACUTE ONLY): 31 min   Charges:   PT Evaluation $PT Eval Low Complexity: 1 Low PT Treatments $Gait Training: 8-22 mins        Verner Mould, DPT Acute Rehabilitation Services  Office [phone] Pager [phone]  10/04/2019 3:30 PM

## 2019-10-04 NOTE — Op Note (Signed)
NAMEATHEENA, SPANO MEDICAL RECORD [id number] ACCOUNT [id number] DATE OF BIRTH:02-22-1960 FACILITY: [facility] LOCATION: [facility] PHYSICIAN:Yong Wahlquist Kerry Fort, MD  OPERATIVE REPORT  DATE OF PROCEDURE:  10/04/2019  PREOPERATIVE DIAGNOSIS:  Primary osteoarthritis and degenerative joint disease, right hip.  POSTOPERATIVE DIAGNOSIS:  Primary osteoarthritis and degenerative joint disease, right hip.  PROCEDURE:  Right total hip arthroplasty through direct anterior approach.  IMPLANTS:  DePuy Sector Gription acetabular component size 52, size 36+0 neutral polyethylene liner, size 2 Actis femoral component with standard offset, size 36+1.5 metal hip ball.  SURGEON:  Lind Guest.  Ninfa Linden, MD  ASSISTANT:  Erskine Emery, PA-C.  ANESTHESIA:  Spinal.  ANTIBIOTICS:  Two g IV Ancef.  ESTIMATED BLOOD LOSS:  300 mL.  COMPLICATIONS:  None.  INDICATIONS:  The patient is a 59 year old female with debilitating arthritis involving her right hip.  This has been well documented with x-rays and clinical exam.  At this point, her right hip pain is debilitating and it is detrimentally affecting her  mobility, her quality of life and her activities of daily living to the point she does wish to proceed with a total hip arthroplasty and we agree with that.  We did talk about the risk of acute blood loss anemia, nerve or vessel injury, fracture,  infection, dislocation, DVT and implant failure.  We talked about skin and soft tissue issues.  We talked about our goals being decreased pain, improved mobility and overall improved quality of life.  DESCRIPTION OF PROCEDURE:  After informed consent was obtained, the appropriate right hip was marked.  She was brought to the operating room and sat up on a stretcher where spinal anesthesia was then obtained.  She was then laid in the  supine position  on a stretcher.  I assessed her leg lengths and found them to be significantly short on the right comparing  the right and left hip, even though x-rays look like she is longer, clinically she is definitely short.  Traction boots were placed on both her  feet.  Next, she was placed supine on the Hana fracture table with a perineal post in place and both legs in line skeletal traction device and no traction applied.  Her right operative hip was prepped and draped with DuraPrep and sterile drapes.  A  time-out was called and she was identified as correct patient, correct right hip.  I then made an incision just inferior and posterior to the anterior superior iliac spine and carried this obliquely down the leg.  We dissected down to the tensor fascia  lata muscle.  Tensor fascia was then divided longitudinally to proceed with direct anterior approach to the hip.  We identified and cauterized circumflex vessels and identified the hip capsule, opened up the hip capsule in an L-type format, finding a  very large joint effusion.  We placed curved retractors around the medial and lateral femoral neck and made our femoral neck cut with an oscillating saw and completed this with an osteotome.  We placed a corkscrew guide in the femoral head and removed  the femoral head in its entirety and found a wide area devoid of cartilage.  I then placed a bent Hohmann over the medial acetabular rim and removed remnants of the acetabular labrum and other debris.  I then began reaming under direct visualization from  a size 43 reamer and going all the way up to a size 49, with all reamers under direct visualization, the last reamer under direct fluoroscopy so I  could obtain my depth of reaming, my inclination and anteversion.  We then tried to place the real DePuy  Sector Gription acetabular component size 50, but I could not get that to seat.  I tried going up to a line-to-line reaming to a 50 reamer to then place this 50 acetabular component and we just could not get it to seat.  I decided to ream with a 51 and  we opened up the new  acetabular component and placed a size 52 reamer that did have a nice tight fit, verifying its placement under direct fluoroscopy.  I still placed a single screw and we went with the neutral polyethylene liner.  Attention was then  turned to the femur.  With the leg externally rotated to 120 degrees, extended and adducted, we were able to place a Mueller retractor medially and a Hohmann retractor behind the greater trochanter, released the lateral joint capsule and used a  box-cutting osteotome to enter the femoral canal and a rongeur to lateralize.  I then began broaching using the Actis broaching system from a size 0 broach, going up to a size 2.  With a size 2 in place, we trialed a standard offset femoral neck and a  32+1.5 hip ball, reduced this in the acetabulum.  We were pleased with the leg length, range of motion, stability and offset assessed mechanically and radiographically.  We then removed the trial components and dislocated the hip.  We placed the real  Actis femoral component size 2 with standard offset and the real 36+1.5 metal hip ball and again reduced this in the acetabulum and we appreciated the stability.  We then irrigated the soft tissue with normal saline solution using pulsatile lavage.  We  reapproximated the joint capsule with #1  Ethibond suture, followed by closing the tensor fascia with #1 Vicryl.  0 Vicryl was used to close the deep tissue and 2-0 Vicryl was used to close the subcutaneous tissue.  The skin was reapproximated with  staples.  Xeroform and Aquacel dressing was applied.  She was taken off the Hana table and taken to recovery room in stable condition with all final counts being correct and no complications noted.  Of note, Benita Stabile, PA-C, assisted during the entire  case.  His assistance was crucial for facilitating all aspects of this case.  VN/NUANCE  D:10/04/2019 T:10/04/2019 JOB:[id number]

## 2019-10-07 ENCOUNTER — Encounter (HOSPITAL_COMMUNITY): Payer: Self-pay | Admitting: Orthopaedic Surgery

## 2019-10-17 ENCOUNTER — Ambulatory Visit (INDEPENDENT_AMBULATORY_CARE_PROVIDER_SITE_OTHER): Payer: 59 | Admitting: Physician Assistant

## 2019-10-17 ENCOUNTER — Encounter: Payer: Self-pay | Admitting: Physician Assistant

## 2019-10-17 DIAGNOSIS — Z96641 Presence of right artificial hip joint: Secondary | ICD-10-CM

## 2019-10-17 NOTE — Progress Notes (Signed)
HPI: [name] returns today 2 weeks status post right total hip arthroplasty.  She is overall doing well states she is having no pain and taking no pain medication.  She has had no fever chills shortness of breath chest pain.  Physical exam: Right hip surgical incisions healing well no signs of infection.  Calf supple nontender.  Dorsiflexion plantarflexion right ankle intact.  Ambulates without any assistive device.  Impression: Status post right total hip arthroplasty 10/04/2018   Plan: Staples removed Steri-Strips applied.  Scar tissue mobilization encouraged.  Follow-up with [place] in 1 month sooner if there is any questions concerns.

## 2019-10-22 ENCOUNTER — Ambulatory Visit: Payer: 59 | Admitting: Internal Medicine

## 2019-11-14 ENCOUNTER — Ambulatory Visit: Payer: 59 | Admitting: Physician Assistant

## 2019-11-18 ENCOUNTER — Encounter: Payer: Self-pay | Admitting: Physician Assistant

## 2019-11-18 ENCOUNTER — Ambulatory Visit (INDEPENDENT_AMBULATORY_CARE_PROVIDER_SITE_OTHER): Payer: 59 | Admitting: Physician Assistant

## 2019-11-18 DIAGNOSIS — Z96641 Presence of right artificial hip joint: Secondary | ICD-10-CM

## 2019-11-18 NOTE — Progress Notes (Signed)
HPI: Katelyn Price returns today status post right total hip arthroplasty now 6 weeks postop.  She has no complaints.  She states her incision is healing well.  She is not taking anything for pain in regards to her hip.  Denies any fevers or chills.   Review of systems: See HPI otherwise negative  Physical exam: General well-developed well-nourished female in no acute distress ambulates without assistive device in a nonantalgic gait.  She gets up easily from a seated position without assistance.  Right hip: Excellent range of motion without pain.  Right calf supple nontender dorsiflexion plantarflexion right ankle intact.  Impression: Status post right total hip arthroplasty 10/04/2019  Plan: She will follow up with [place] in 4 months at that point time we will obtain an AP pelvis and lateral view of the right hip.  She will follow-up with [place] sooner if there is any questions concerns.

## 2019-11-20 ENCOUNTER — Other Ambulatory Visit: Payer: Self-pay

## 2019-11-20 ENCOUNTER — Ambulatory Visit (INDEPENDENT_AMBULATORY_CARE_PROVIDER_SITE_OTHER): Payer: 59 | Admitting: Family Medicine

## 2019-11-20 ENCOUNTER — Encounter: Payer: Self-pay | Admitting: Family Medicine

## 2019-11-20 VITALS — BP 134/76 | HR 54 | Temp 97.8°F | Ht 62.0 in | Wt 162.6 lb

## 2019-11-20 DIAGNOSIS — K449 Diaphragmatic hernia without obstruction or gangrene: Secondary | ICD-10-CM

## 2019-11-20 DIAGNOSIS — K219 Gastro-esophageal reflux disease without esophagitis: Secondary | ICD-10-CM

## 2019-11-20 DIAGNOSIS — I10 Essential (primary) hypertension: Secondary | ICD-10-CM

## 2019-11-20 DIAGNOSIS — G251 Drug-induced tremor: Secondary | ICD-10-CM

## 2019-11-20 DIAGNOSIS — M1611 Unilateral primary osteoarthritis, right hip: Secondary | ICD-10-CM

## 2019-11-20 DIAGNOSIS — R001 Bradycardia, unspecified: Secondary | ICD-10-CM | POA: Insufficient documentation

## 2019-11-20 DIAGNOSIS — Z96641 Presence of right artificial hip joint: Secondary | ICD-10-CM | POA: Insufficient documentation

## 2019-11-20 DIAGNOSIS — Z23 Encounter for immunization: Secondary | ICD-10-CM | POA: Diagnosis not present

## 2019-11-20 NOTE — Assessment & Plan Note (Signed)
Asxs. Check EKG. On propranolol - may need lower dose. pt to touch base with Lattie Haw if ongoing

## 2019-11-20 NOTE — Assessment & Plan Note (Signed)
Improved readings on losartan 25mg  daily with propranolol - continue.

## 2019-11-20 NOTE — Assessment & Plan Note (Signed)
Again suggested trial of QOD PPI dosing.

## 2019-11-20 NOTE — Assessment & Plan Note (Signed)
Chronic issue, has seen neurology.  May need lower propranolol dose if bradycardia worsens.

## 2019-11-20 NOTE — Progress Notes (Signed)
This visit was conducted in person.  BP 134/76 (BP Location: Left Arm, Patient Position: Sitting, Cuff Size: Normal)   Pulse (!) 54   Temp 97.8 F (36.6 C) (Temporal)   Ht 5\' 2"  (1.575 m)   Wt 162 lb 9 oz (73.7 kg)   SpO2 100%   BMI 29.73 kg/m   Pulse Readings from Last 3 Encounters:  11/20/19 (!) 54  10/04/19 88  09/26/19 (!) 54   CC: 3 mo f/u visit  Subjective:    Patient ID: Katelyn Price, female    DOB: [birth year], 59 y.o.   MRN: [id number]  HPI: Katelyn Price is a 59 y.o. female presenting on 11/20/2019 for Hypertension (Here for 3 mo f/u. )   See prior note for details.   S/p R THR 09/2019, recovering well.   HTN - Compliant with current antihypertensive regimen of losartan 25mg  daily. She is also on propranolol 120mg  daily for medication related tremor/essential tremor (lithium). Amlodipine caused ankle swelling. Does not regularly check blood pressures at home. No low blood pressure readings or symptoms of dizziness/syncope. Denies HA, vision changes, CP/tightness, SOB, leg swelling.    Noted bradycardia - denies dizziness, lightheadedness, shortness of breath or palpitations.   Last visit we discussed trying PPI taper to QOD - unsure if she's done this as husband manages medications. She denies significant GERD symptoms.      Relevant past medical, surgical, family and social history reviewed and updated as indicated. Interim medical history since our last visit reviewed. Allergies and medications reviewed and updated. Outpatient Medications Prior to Visit  Medication Sig Dispense Refill  . baclofen (LIORESAL) 10 MG tablet Take by mouth.    Marland Kitchen buPROPion (WELLBUTRIN XL) 150 MG 24 hr tablet Take 2 tablets (300 mg total) by mouth daily.    . busPIRone (BUSPAR) 7.5 MG tablet Take 7.5 mg by mouth 3 (three) times daily.    . Ca & Phos-Vit D-Mag (CALCIUM) [phone] TABS Take 1 tablet by mouth in the morning, at noon, and at bedtime.     . Cholecalciferol  (VITAMIN D3) 125 MCG (5000 UT) CAPS Take 5,000 Units by mouth daily.     . citalopram (CELEXA) 20 MG tablet Take 1 tablet (20 mg total) by mouth daily.    . Cyanocobalamin (VITAMELTS ENERGY VITAMIN B-12) 1500 MCG TBDP Take 1 tablet by mouth every Monday, Wednesday, and Friday.    . estradiol (ESTRACE) 2 MG tablet Take 2 mg by mouth daily.    . ferrous sulfate 324 (65 Fe) MG TBEC Take 1 tablet (325 mg total) by mouth daily. (Patient taking differently: Take 1 tablet by mouth 3 (three) times a week. ) 30 tablet   . lithium carbonate (ESKALITH) 450 MG CR tablet Take 450 mg by mouth at bedtime.     Marland Kitchen losartan (COZAAR) 25 MG tablet Take 1 tablet (25 mg total) by mouth daily. 90 tablet 3  . methimazole (TAPAZOLE) 5 MG tablet TAKE 1/2 TABLET BY MOUTH EVERY DAY (Patient taking differently: Take 2.5 mg by mouth at bedtime. ) 90 tablet 3  . ondansetron (ZOFRAN ODT) 4 MG disintegrating tablet Take 1 tablet (4 mg total) by mouth every 8 (eight) hours as needed for nausea or vomiting. 20 tablet 0  . pantoprazole (PROTONIX) 40 MG tablet Take 1 tablet (40 mg total) by mouth daily. 90 tablet 3  . polyethylene glycol powder (GLYCOLAX/MIRALAX) 17 GM/SCOOP powder Take 17 g by mouth daily as needed for  moderate constipation. 3350 g 1  . progesterone (PROMETRIUM) 200 MG capsule Take 200 mg by mouth at bedtime.     . progesterone (PROMETRIUM) 200 MG capsule Take 200 mg by mouth at bedtime.     . propranolol ER (INDERAL LA) 120 MG 24 hr capsule Take 120 mg by mouth at bedtime.     . psyllium (METAMUCIL SMOOTH TEXTURE) 58.6 % powder Take 1 packet by mouth daily.    Marland Kitchen tretinoin (RETIN-A) 0.05 % cream Apply 1 application topically daily as needed (blemishes).     . ziprasidone (GEODON) 80 MG capsule Take 240 mg by mouth at bedtime.      No facility-administered medications prior to visit.     Per HPI unless specifically indicated in ROS section below Review of Systems Objective:  BP 134/76 (BP Location: Left Arm,  Patient Position: Sitting, Cuff Size: Normal)   Pulse (!) 54   Temp 97.8 F (36.6 C) (Temporal)   Ht 5\' 2"  (1.575 m)   Wt 162 lb 9 oz (73.7 kg)   SpO2 100%   BMI 29.73 kg/m   Wt Readings from Last 3 Encounters:  11/20/19 162 lb 9 oz (73.7 kg)  08/19/19 171 lb 4 oz (77.7 kg)  04/22/19 178 lb (80.7 kg)      Physical Exam Vitals and nursing note reviewed.  Constitutional:      Appearance: Normal appearance. She is not ill-appearing.  Cardiovascular:     Rate and Rhythm: Regular rhythm. Bradycardia present.     Pulses: Normal pulses.     Heart sounds: Normal heart sounds. No murmur heard.   Pulmonary:     Effort: Pulmonary effort is normal. No respiratory distress.     Breath sounds: Normal breath sounds. No wheezing, rhonchi or rales.  Musculoskeletal:     Right lower leg: No edema.     Left lower leg: No edema.  Skin:    General: Skin is warm and dry.     Findings: No rash.  Neurological:     Mental Status: She is alert.     Comments: Tremor present  Psychiatric:        Mood and Affect: Mood normal.        Behavior: Behavior normal.      EKG - sinus bradycardia rate 50, normal axis, intervals, no acute ST/T changes  Assessment & Plan:  This visit occurred during the SARS-CoV-2 public health emergency.  Safety protocols were in place, including screening questions prior to the visit, additional usage of staff PPE, and extensive cleaning of exam room while observing appropriate contact time as indicated for disinfecting solutions.   Problem List Items Addressed This Visit    Unilateral primary osteoarthritis, right hip   Status post right hip replacement   HYPERTENSION, BENIGN ESSENTIAL - Primary    Improved readings on losartan 25mg  daily with propranolol - continue.       Hiatal hernia   GERD (gastroesophageal reflux disease)    Again suggested trial of QOD PPI dosing.       Drug-induced tremor    Chronic issue, has seen neurology.  May need lower propranolol  dose if bradycardia worsens.       Bradycardia    Asxs. Check EKG. On propranolol - may need lower dose. pt to touch base with Lattie Haw if ongoing      Relevant Orders   EKG 12-Lead (Completed)    Other Visit Diagnoses    Need for influenza vaccination  Relevant Orders   Flu Vaccine QUAD 36+ mos IM (Completed)   Need for shingles vaccine       Relevant Orders   Varicella-zoster vaccine IM (Completed)       No orders of the defined types were placed in this encounter.  Orders Placed This Encounter  Procedures  . Flu Vaccine QUAD 36+ mos IM  . Varicella-zoster vaccine IM  . EKG 12-Lead    Patient Instructions  Heart rate was low today - EKG checked today  Start monitoring heart rate (pulse) at home (how many times the heart beats in 1 minute), if consistently <60, especially into the 40's, let us and/or Noemi Chapel know as you may need to drop your dose of propranolol.  Blood pressure was looking good today - continue losartan 25mg  daily. I recommend trying to decrease pantoprazole (heartburn medicine) to every other day to see if reflux symptoms stay well controlled on a lower dose.  Good to see you today  Return as needed or 4-6 months in for follow up visit    Follow up plan: Return in about 4 months (around 03/19/2020) for follow up visit.  Ria Bush, MD

## 2019-11-20 NOTE — Patient Instructions (Addendum)
Heart rate was low today - EKG checked today  Start monitoring heart rate (pulse) at home (how many times the heart beats in 1 minute), if consistently <60, especially into the 40's, let us and/or Katelyn Price know as you may need to drop your dose of propranolol.  Blood pressure was looking good today - continue losartan 25mg  daily. I recommend trying to decrease pantoprazole (heartburn medicine) to every other day to see if reflux symptoms stay well controlled on a lower dose.  Good to see you today  Return as needed or 4-6 months in for follow up visit

## 2019-12-12 ENCOUNTER — Encounter: Payer: Self-pay | Admitting: Internal Medicine

## 2019-12-12 ENCOUNTER — Other Ambulatory Visit: Payer: Self-pay

## 2019-12-12 ENCOUNTER — Ambulatory Visit (INDEPENDENT_AMBULATORY_CARE_PROVIDER_SITE_OTHER): Payer: 59 | Admitting: Internal Medicine

## 2019-12-12 VITALS — BP 128/82 | HR 56 | Ht 62.0 in | Wt 160.6 lb

## 2019-12-12 DIAGNOSIS — E0500 Thyrotoxicosis with diffuse goiter without thyrotoxic crisis or storm: Secondary | ICD-10-CM | POA: Diagnosis not present

## 2019-12-12 DIAGNOSIS — E042 Nontoxic multinodular goiter: Secondary | ICD-10-CM | POA: Diagnosis not present

## 2019-12-12 LAB — T4, FREE: Free T4: 0.48 ng/dL — ABNORMAL LOW (ref 0.60–1.60)

## 2019-12-12 LAB — TSH: TSH: 2.14 u[IU]/mL (ref 0.35–4.50)

## 2019-12-12 LAB — T3, FREE: T3, Free: 3.3 pg/mL (ref 2.3–4.2)

## 2019-12-12 NOTE — Progress Notes (Signed)
Patient ID: Katelyn Price, female   DOB: 1960-09-21, 59 y.o.   MRN: [id number]   This visit occurred during the SARS-CoV-2 public health emergency.  Safety protocols were in place, including screening questions prior to the visit, additional usage of staff PPE, and extensive cleaning of exam room while observing appropriate contact time as indicated for disinfecting solutions.   HPI  Katelyn Price is a 59 y.o.-year-old female, presenting for f/u for Graves ds. and thyroid nodules. Last visit 7.5 months ago.  She had right TKR in 09/2019 >> she feels much better after the sx.  Reviewed history: Patient was diagnosed with thyrotoxicosis in summer 2017 and was confirmed as Graves' disease on an uptake and scan from 10/2015.   Reviewed her treatment history: She was initially started on methimazole 10 mg twice a day, then decreased to 5 mg twice a day in 02/2016 after which TFTs normalized.  We were able to decrease the dose further to 5 mg daily.  We have tried an even lower dose but her TSH became suppressed on less than 5 mg methimazole daily..  In 01/2017, we increase the dose of methimazole to 5 mg twice a day of methimazole  In 11/2017, we were able to decrease the dose to 5 mg once a day.  In 03/2018 we were able to decrease the dose to 2.5 mg once a day.  In 10/2018, we continued methimazole 2.5 mg once a day  In 04/2019, we continued the same dose.  Reviewed her TFTs: Lab Results  Component Value Date   TSH 0.54 04/22/2019   TSH 0.44 10/12/2018   TSH 1.11 07/09/2018   TSH 1.55 03/23/2018   TSH 1.75 12/26/2017   TSH 6.31 (H) 11/20/2017   TSH 3.27 08/23/2017   TSH 1.32 05/31/2017   TSH 0.20 (L) 04/25/2017   TSH 0.02 (L) 03/17/2017   FREET4 0.51 (L) 04/22/2019   FREET4 0.55 (L) 10/12/2018   FREET4 0.41 (L) 07/09/2018   FREET4 0.46 (L) 03/23/2018   FREET4 0.42 (L) 12/26/2017   FREET4 0.42 (L) 11/20/2017   FREET4 0.41 (L) 08/23/2017   FREET4 0.51 (L) 05/31/2017    FREET4 0.53 (L) 04/25/2017   FREET4 0.64 03/17/2017   Lab Results  Component Value Date   T3FREE 3.4 04/22/2019   T3FREE 3.3 10/12/2018   T3FREE 2.8 07/09/2018   T3FREE 3.7 03/23/2018   T3FREE 3.2 12/26/2017   T3FREE 3.2 11/20/2017   T3FREE 3.0 08/23/2017   T3FREE 3.7 05/31/2017   T3FREE 3.7 04/25/2017   T3FREE 2.9 03/17/2017   Her Graves' antibodies were elevated: Lab Results  Component Value Date   TSI 411 (H) 07/09/2018   TSI 499 (H) 05/31/2017   TSI 442 (H) 11/14/2016   TSI 630 (H) 10/14/2015   Component     Latest Ref Rng & Units 08/25/2015  Thyrotropin Receptor Ab     <=16.0 % 41.4 (H)   Reviewed and addended history: Thyroid ultrasound on 09/04/2015: Right thyroid lobe: 4.1 cm x 1.7 cm x 1.9 cm. Relatively homogeneous appearance of right thyroid tissue.  Right thyroid nodule #1 measures 0.7 cm x 0.7 cm x 0.7 cm mixed cystic and solid composition (1), with hypoechoic echogenicity (2),  taller than wide shape (3) Left thyroid lobe: 5.1 cm x 1.9 cm x 2.4 cm. Relatively homogeneous appearance of the left thyroid.  *Inferior left nodule #1 measures 1.5 cm x 1.1 cm x 1.3 cm Nearly completely solid composition (2), with isoechoic  echogenicity (1).   There are 3 additional separate nodules all measuring less than 5 mm with characteristics compatible with colloid cysts. Isthmus Thickness: 0.4 cm.  No nodules visualized. Lymphadenopathy: None visualized.  Thyroid Uptake and scan (10/22/2015) >> Graves' disease.: 24 hour radioactive iodine uptake 36.9%.Diffuse radiotracer uptake identified within both lobes of the thyroid gland.  No dominant  Thyroid U/S (10/26/2018): Stable, small nodules: Parenchymal Echotexture: Moderately heterogenous Isthmus: Normal in size measuring 0.4 cm in diameter Right lobe: Normal in size measuring 5.2 x 2.1 x 2.2 cm, unchanged previously, 4.1 x 1.7 x 1.9 cm Left lobe: Normal in size measuring 5.4 x 2.0 x 2.4 cm, unchanged, previously, 5.1 x  1.9 x 2.4 cm _________________________________________________________  Previously noted approximately 0.7 cm minimally complex cyst within the inferior pole the right lobe of the thyroid is not demonstrated on the present examination. __________________________________________________  The previously noted approximately 1.5 x 1.3 x 1.1 cm isoechoic nodule within the mid, inferior aspect the left lobe of the thyroid is not seen on the present examination and thus may have represented a pseudonodule.  There is an approximately 1.0 x 1.0 x 0.8 cm isoechoic ill-defined nodule/pseudonodule within the mid aspect the left lobe of the thyroid (labeled 1) was not definitely seen on the 2017 examination however does not meet imaging criteria to recommend percutaneous sampling or continued dedicated follow-up.  There is an approximately 1.0 x 0.9 x 0.9 cm partially cystic, partially solid nodule within the inferior pole the left lobe of the thyroid (labeled 2), which was not definitely seen on the 2017 examination, however does not meet imaging criteria to recommend percutaneous sampling or continued dedicated follow-up.  IMPRESSION: 1. Findings suggestive of multinodular goiter. 2. No worrisome new or enlarging thyroid nodules. 3. None of the discretely measured thyroid nodules meet imaging criteria to recommend percutaneous sampling or continued dedicated follow-up.  Electronically Signed   By: Sandi Mariscal M.D.   On: 10/26/2018 10:45      She is on propranolol 120 mg in a.m. for tremors.  On lithium for bipolar disorder.  Pt denies: - feeling nodules in neck - hoarseness - dysphagia - choking - SOB with lying down  Pt does have a FH of thyroid ds on father's side of the family. No FH of thyroid cancer. No h/o radiation tx to head or neck.  No seaweed or kelp. No recent contrast studies. No herbal supplements. No Biotin use. No recent steroids use.   Pt. also has a  history of GERD, psoriasis, bipolar dis., HTN, B12 deficiency - on 1500 mcg daily.   B12 levels were normal at last check: Lab Results  Component Value Date   VITAMINB12 675 08/16/2019   VITAMINB12 564 08/09/2018   VITAMINB12 1,157 (H) 10/05/2017   VITAMINB12 968 (H) 08/22/2016   VITAMINB12 >1500 (H) 08/21/2015   VITAMINB12 >1500 (H) 08/11/2014   VITAMINB12 189 (L) 07/26/2013   VITAMINB12 127 (L) 09/03/2012   + History of vaginal spotting-seeing OB/GYN (Dr. Milta Deiters).  On HRT.  ROS: Constitutional: no weight gain/no weight loss, no fatigue, no subjective hyperthermia, no subjective hypothermia Eyes: no blurry vision, no xerophthalmia ENT: no sore throat, + see HPI Cardiovascular: no CP/no SOB/no palpitations/no leg swelling Respiratory: no cough/no SOB/no wheezing Gastrointestinal: no N/no V/no D/no C/no acid reflux Musculoskeletal: no muscle aches/no joint aches Skin: no rashes, no hair loss Neurological: + tremors/no numbness/no tingling/no dizziness  I reviewed pt's medications, allergies, PMH, social hx, family hx, and changes were  documented in the history of present illness. Otherwise, unchanged from my initial visit note.  Past Medical History:  Diagnosis Date  . Abnormal involuntary movements(781.0)   . Acne   . Allergic rhinitis   . Arthritis   . Asthma   . Bipolar disorder, unspecified ([place])    psych - Dr. Lattie Haw  . Common migraine with intractable migraine 10/09/2018  . Depression   . Diaphragmatic hernia without mention of obstruction or gangrene   . GERD (gastroesophageal reflux disease)    03/18/02, EGD, [facility] Gerd,gastritis, dilated  . History of MRI of brain and brain stem 06/01/03   [facility]  . Hypertension   . Other psoriasis   . Tremor of both hands 10/09/2018  . Unspecified sinusitis (chronic)    Past Surgical History:  Procedure Laterality Date  . COLONOSCOPY  03/18/02   internal hemorrhoids  . COLONOSCOPY  06/2014   diverticulosis, rpt 10 yrs Collene Mares)  .  ESOPHAGOGASTRODUODENOSCOPY  1/99   [facility]; HH,GERD  . ESOPHAGOGASTRODUODENOSCOPY  03/18/02   HH,GERD,Gastritis; Dilated  . LAPAROSCOPIC ESOPHAGOGASTRIC FUNDOPLASTY  08/28/02   [name]  . TOTAL HIP ARTHROPLASTY Right 10/04/2019   Procedure: RIGHT TOTAL HIP ARTHROPLASTY ANTERIOR APPROACH;  Surgeon: Mcarthur Rossetti, MD;  Location: [facility];  Service: Orthopedics;  Laterality: Right;   Social History   Social History  . Marital status: Married    Spouse name: N/A  . Number of children: 0   Occupational History  . Housewife    Social History Main Topics  . Smoking status: Former Smoker    Packs/day: 1.00    Years: 20.00    Types: Cigarettes    Quit date: 01/10/1998  . Smokeless tobacco: Never Used  . Alcohol use No  . Drug use: No   Social History Narrative   Caffeine: drinks diet drinks   Lives with husband and 1 dog, 1 cat   Occupation: housewife   Activity: no regular exercise, doesn't feel safe to walk outside at home, could walk on mother's treadmill   Diet: good water, fruits/vegetables some    Current Outpatient Medications on File Prior to Visit  Medication Sig Dispense Refill  . buPROPion (WELLBUTRIN XL) 150 MG 24 hr tablet Take 2 tablets (300 mg total) by mouth daily.    . busPIRone (BUSPAR) 7.5 MG tablet Take 7.5 mg by mouth 3 (three) times daily.    . Ca & Phos-Vit D-Mag (CALCIUM) [phone] TABS Take 1 tablet by mouth in the morning, at noon, and at bedtime.     . Cholecalciferol (VITAMIN D3) 125 MCG (5000 UT) CAPS Take 5,000 Units by mouth daily.     . citalopram (CELEXA) 20 MG tablet Take 1 tablet (20 mg total) by mouth daily.    . Cyanocobalamin (VITAMELTS ENERGY VITAMIN B-12) 1500 MCG TBDP Take 1 tablet by mouth every Monday, Wednesday, and Friday.    . estradiol (ESTRACE) 2 MG tablet Take 2 mg by mouth daily.    . ferrous sulfate 324 (65 Fe) MG TBEC Take 1 tablet (325 mg total) by mouth daily. (Patient taking differently: Take 1 tablet by mouth 3  (three) times a week. ) 30 tablet   . lithium carbonate (ESKALITH) 450 MG CR tablet Take 450 mg by mouth at bedtime.     . methimazole (TAPAZOLE) 5 MG tablet TAKE 1/2 TABLET BY MOUTH EVERY DAY (Patient taking differently: Take 2.5 mg by mouth at bedtime. ) 90 tablet 3  . pantoprazole (PROTONIX) 40 MG tablet Take 1  tablet (40 mg total) by mouth daily. 90 tablet 3  . progesterone (PROMETRIUM) 200 MG capsule Take 200 mg by mouth at bedtime.     . progesterone (PROMETRIUM) 200 MG capsule Take 200 mg by mouth at bedtime.     . propranolol ER (INDERAL LA) 120 MG 24 hr capsule Take 120 mg by mouth at bedtime.     . psyllium (METAMUCIL SMOOTH TEXTURE) 58.6 % powder Take 1 packet by mouth daily.    . ziprasidone (GEODON) 80 MG capsule Take 240 mg by mouth at bedtime.     . baclofen (LIORESAL) 10 MG tablet Take by mouth. (Patient not taking: Reported on 12/12/2019)    . losartan (COZAAR) 25 MG tablet Take 1 tablet (25 mg total) by mouth daily. (Patient not taking: Reported on 12/12/2019) 90 tablet 3  . ondansetron (ZOFRAN ODT) 4 MG disintegrating tablet Take 1 tablet (4 mg total) by mouth every 8 (eight) hours as needed for nausea or vomiting. (Patient not taking: Reported on 12/12/2019) 20 tablet 0  . polyethylene glycol powder (GLYCOLAX/MIRALAX) 17 GM/SCOOP powder Take 17 g by mouth daily as needed for moderate constipation. (Patient not taking: Reported on 12/12/2019) 3350 g 1  . tretinoin (RETIN-A) 0.05 % cream Apply 1 application topically daily as needed (blemishes).  (Patient not taking: Reported on 12/12/2019)     No current facility-administered medications on file prior to visit.   Allergies  Allergen Reactions  . Amlodipine Swelling    Ankle swelling  . Hydrocodone-Acetaminophen     Pt says it does not work for her  . Minocycline Hcl Itching   Family History  Problem Relation Age of Onset  . Arthritis Mother        fibromyalgia  . Alcohol abuse Father   . Emphysema Brother        smoker   . COPD Brother        smoker, continues.  . Arthritis Sister        fibromyalgia  . Cancer Neg Hx   . Coronary artery disease Neg Hx   . Stroke Neg Hx   . Diabetes Neg Hx    PE: BP 128/82   Pulse (!) 56   Ht 5\' 2"  (1.575 m)   Wt 160 lb 9.6 oz (72.8 kg)   SpO2 97%   BMI 29.37 kg/m  Wt Readings from Last 3 Encounters:  12/12/19 160 lb 9.6 oz (72.8 kg)  11/20/19 162 lb 9 oz (73.7 kg)  08/19/19 171 lb 4 oz (77.7 kg)   Constitutional: overweight, in NAD Eyes: PERRLA, EOMI, no exophthalmos ENT: moist mucous membranes, no thyromegaly, no cervical lymphadenopathy Cardiovascular: RRR, No MRG Respiratory: CTA B Gastrointestinal: abdomen soft, NT, ND, BS+ Musculoskeletal: no deformities, strength intact in all 4 Skin: moist, warm, no rashes Neurological: +++ tremor with outstretched hands, DTR normal in all 4  ASSESSMENT: 1.  Graves' disease  2. Thyroid nodules  PLAN:  1. Patient with history of thyrotoxicosis diagnosed, when she presented with thyrotoxic symptoms: Tremors (however, these are likely due to lithium), heat intolerance, palpitations, shortness of breath.  She started methimazole and we were able to decrease the dose gradually to 2.5 mg daily.  She continues on 2.5 mg daily. -She feels well on this except she continues to have tremors, which are chronic for her. -Reviewed the latest TFTs which were normal at last visit -Latest TSI's were still high.  We will not repeat them today. -We will repeat her TFTs today  and change the methimazole dose accordingly -She continues on a beta-blocker for tremors, and this should also help with tachycardia. -We will see her back in 6 months  2. Thyroid nodules -No neck compression symptoms -She has a 1.5 cm iso or slightly hypoechoic thyroid nodule, with internal blood flow on her ultrasound from 08/2015.  This nodule appeared warm on the uptake and scan which can either indicate an inflammatory nodule (pseudonodule) or a normal  functioning nodule.  Since the nodule was seen in the context of thyrotoxicosis, we decided to wait until she was euthyroid to repeat the ultrasound then.  We did repeat her thyroid ultrasound on 10/26/2018 and this showed that 2 of her thyroid nodules were not visible anymore and 2 nodules were very small, not worrisome. -We can continue to just follow her clinically without the need for further ultrasounds  Component     Latest Ref Rng & Units 12/12/2019  TSH     0.35 - 4.50 uIU/mL 2.14  T4,Free(Direct)     0.60 - 1.60 ng/dL 0.48 (L)  Triiodothyronine,Free,Serum     2.3 - 4.2 pg/mL 3.3  TFTs are normal with the exception of a slightly low free T4.  I would suggest to continue the current dose of methimazole for now.  Philemon Kingdom, MD PhD [facility] Endocrinology

## 2019-12-12 NOTE — Patient Instructions (Addendum)
Please continue Methimazole 2.5 mg daily.  Please stop at the lab.  Please come back for a follow-up appointment in 6 months.  

## 2020-01-06 ENCOUNTER — Other Ambulatory Visit: Payer: Self-pay

## 2020-01-06 ENCOUNTER — Encounter: Payer: Self-pay | Admitting: Family Medicine

## 2020-01-06 ENCOUNTER — Ambulatory Visit: Payer: 59 | Admitting: Family Medicine

## 2020-01-06 ENCOUNTER — Ambulatory Visit (INDEPENDENT_AMBULATORY_CARE_PROVIDER_SITE_OTHER): Payer: 59 | Admitting: Family Medicine

## 2020-01-06 VITALS — BP 132/80 | HR 51 | Temp 97.6°F | Ht 62.0 in | Wt 161.0 lb

## 2020-01-06 DIAGNOSIS — R350 Frequency of micturition: Secondary | ICD-10-CM | POA: Insufficient documentation

## 2020-01-06 DIAGNOSIS — N393 Stress incontinence (female) (male): Secondary | ICD-10-CM | POA: Insufficient documentation

## 2020-01-06 LAB — POC URINALSYSI DIPSTICK (AUTOMATED)
Bilirubin, UA: NEGATIVE
Blood, UA: NEGATIVE
Glucose, UA: NEGATIVE
Ketones, UA: NEGATIVE
Leukocytes, UA: NEGATIVE
Nitrite, UA: NEGATIVE
Protein, UA: NEGATIVE
Spec Grav, UA: <=1.005 — AB (ref 1.010–1.025)
Urobilinogen, UA: 0.2 E.U./dL
pH, UA: 6 (ref 5.0–8.0)

## 2020-01-06 NOTE — Assessment & Plan Note (Signed)
UA today reassuring. UCx sent.  Discussed water intake, avoiding bladder irritants. rec pelvic exam r/o POP - she states she will call GYN to schedule this.

## 2020-01-06 NOTE — Progress Notes (Signed)
Patient ID: Katelyn Price, female    DOB: 09-Mar-1960, 59 y.o.   MRN: [id number]  This visit was conducted in person.  BP 132/80 (BP Location: Right Arm, Patient Position: Sitting, Cuff Size: Normal)   Pulse (!) 51   Temp 97.6 F (36.4 C) (Temporal)   Ht 5\' 2"  (1.575 m)   Wt 161 lb (73 kg)   SpO2 98%   BMI 29.45 kg/m    CC: abd pressure, urinary frequency Subjective:   HPI: Katelyn Price is a 59 y.o. female presenting on 01/06/2020 for Abdominal Pain (C/o low abd pressure and urinary frequency. )   1 wk h/o lower abd pressure (clarified to vaginal pressure sensation), urinary frequency, possible slowed stream.   No dysuria, hematuria, fevers/chills, flank pain, nausea/vomiting. No rash.  No recent UTI.  No recent abx use.  No recent bladder instrumentation.  Avoids caffeinated beverages and spicy foods.   She normally sees Katelyn Price gynecology.      Relevant past medical, surgical, family and social history reviewed and updated as indicated. Interim medical history since our last visit reviewed. Allergies and medications reviewed and updated. Outpatient Medications Prior to Visit  Medication Sig Dispense Refill  . baclofen (LIORESAL) 10 MG tablet Take by mouth.    Marland Kitchen buPROPion (WELLBUTRIN XL) 150 MG 24 hr tablet Take 2 tablets (300 mg total) by mouth daily.    . busPIRone (BUSPAR) 7.5 MG tablet Take 7.5 mg by mouth 3 (three) times daily.    . Ca & Phos-Vit D-Mag (CALCIUM) [phone] TABS Take 1 tablet by mouth in the morning, at noon, and at bedtime.     . Cholecalciferol (VITAMIN D3) 125 MCG (5000 UT) CAPS Take 5,000 Units by mouth daily.     . citalopram (CELEXA) 20 MG tablet Take 1 tablet (20 mg total) by mouth daily.    . Cyanocobalamin (VITAMELTS ENERGY VITAMIN B-12) 1500 MCG TBDP Take 1 tablet by mouth every Monday, Wednesday, and Friday.    . estradiol (ESTRACE) 2 MG tablet Take 2 mg by mouth daily.    . ferrous sulfate 324 (65 Fe) MG TBEC Take 1 tablet  (325 mg total) by mouth daily. (Patient taking differently: Take 1 tablet by mouth 3 (three) times a week.) 30 tablet   . lithium carbonate (ESKALITH) 450 MG CR tablet Take 450 mg by mouth at bedtime.     Marland Kitchen losartan (COZAAR) 25 MG tablet Take 1 tablet (25 mg total) by mouth daily. 90 tablet 3  . methimazole (TAPAZOLE) 5 MG tablet TAKE 1/2 TABLET BY MOUTH EVERY DAY (Patient taking differently: Take 2.5 mg by mouth at bedtime.) 90 tablet 3  . ondansetron (ZOFRAN ODT) 4 MG disintegrating tablet Take 1 tablet (4 mg total) by mouth every 8 (eight) hours as needed for nausea or vomiting. 20 tablet 0  . pantoprazole (PROTONIX) 40 MG tablet Take 1 tablet (40 mg total) by mouth daily. 90 tablet 3  . polyethylene glycol powder (GLYCOLAX/MIRALAX) 17 GM/SCOOP powder Take 17 g by mouth daily as needed for moderate constipation. 3350 g 1  . progesterone (PROMETRIUM) 200 MG capsule Take 200 mg by mouth at bedtime.     . progesterone (PROMETRIUM) 200 MG capsule Take 200 mg by mouth at bedtime.     . propranolol ER (INDERAL LA) 120 MG 24 hr capsule Take 120 mg by mouth at bedtime.     . psyllium (METAMUCIL SMOOTH TEXTURE) 58.6 % powder Take 1 packet  by mouth daily.    Marland Kitchen tretinoin (RETIN-A) 0.05 % cream Apply 1 application topically daily as needed (blemishes).    . ziprasidone (GEODON) 80 MG capsule Take 240 mg by mouth at bedtime.     No facility-administered medications prior to visit.     Per HPI unless specifically indicated in ROS section below Review of Systems Objective:  BP 132/80 (BP Location: Right Arm, Patient Position: Sitting, Cuff Size: Normal)   Pulse (!) 51   Temp 97.6 F (36.4 C) (Temporal)   Ht 5\' 2"  (1.575 m)   Wt 161 lb (73 kg)   SpO2 98%   BMI 29.45 kg/m   Wt Readings from Last 3 Encounters:  01/06/20 161 lb (73 kg)  12/12/19 160 lb 9.6 oz (72.8 kg)  11/20/19 162 lb 9 oz (73.7 kg)      Physical Exam Vitals and nursing note reviewed.  Constitutional:      Appearance: Normal  appearance. She is not ill-appearing.  Abdominal:     General: Abdomen is flat. Bowel sounds are normal. There is no distension.     Palpations: Abdomen is soft. There is no mass.     Tenderness: There is no abdominal tenderness. There is no right CVA tenderness, left CVA tenderness, guarding or rebound. Negative signs include Murphy's sign.     Hernia: No hernia is present.  Musculoskeletal:     Right lower leg: No edema.     Left lower leg: No edema.  Neurological:     Mental Status: She is alert.  Psychiatric:        Mood and Affect: Mood normal.        Behavior: Behavior normal.       Results for orders placed or performed in visit on 01/06/20  POCT Urinalysis Dipstick (Automated)  Result Value Ref Range   Color, UA light yellow    Clarity, UA clear    Glucose, UA Negative Negative   Bilirubin, UA negative    Ketones, UA negative    Spec Grav, UA <=1.005 (A) 1.010 - 1.025   Blood, UA negative    pH, UA 6.0 5.0 - 8.0   Protein, UA Negative Negative   Urobilinogen, UA 0.2 0.2 or 1.0 E.U./dL   Nitrite, UA negative    Leukocytes, UA Negative Negative   Assessment & Plan:  This visit occurred during the SARS-CoV-2 public health emergency.  Safety protocols were in place, including screening questions prior to the visit, additional usage of staff PPE, and extensive cleaning of exam room while observing appropriate contact time as indicated for disinfecting solutions.   Problem List Items Addressed This Visit    Urinary frequency - Primary    UA today reassuring. UCx sent.  Discussed water intake, avoiding bladder irritants. rec pelvic exam r/o POP - she states she will call GYN to schedule this.       Relevant Orders   POCT Urinalysis Dipstick (Automated) (Completed)   Urine Culture       No orders of the defined types were placed in this encounter.  Orders Placed This Encounter  Procedures  . Urine Culture  . POCT Urinalysis Dipstick (Automated)    Follow up  plan: Return if symptoms worsen or fail to improve.  01/08/20, MD

## 2020-01-07 LAB — URINE CULTURE
MICRO NUMBER:: 11358265
Result:: NO GROWTH
SPECIMEN QUALITY:: ADEQUATE

## 2020-01-16 ENCOUNTER — Telehealth: Payer: Self-pay | Admitting: Family Medicine

## 2020-01-16 NOTE — Telephone Encounter (Signed)
Patient called in stating she has been receiving calls from our number all week. Pt is inquiring what this is in regards to. No notes on file of call. Please advise.

## 2020-01-16 NOTE — Telephone Encounter (Signed)
[facility] Night - Client Nonclinical Telephone Record AccessNurse Client [facility] Night - Client Client Site [facility] [place] - Night Physician Eustaquio Boyden - MD Contact Type Call Who Is Calling Patient / Member / Family / Caregiver Caller Name Katelyn Price Caller Phone Number [phone] Call Type Message Only Information Provided Reason for Call Returning a Call from the Office Initial Comment Caller states that she needs to speak to Dr. Sharen Hones'. Declined triage. Disp. Time Disposition Final User 01/16/2020 7:23:53 AM General Information Provided Yes Brooke Pace Call Closed By: Brooke Pace Transaction Date/Time: 01/16/2020 7:21:44 AM (ET)

## 2020-03-18 ENCOUNTER — Encounter: Payer: Self-pay | Admitting: Orthopaedic Surgery

## 2020-03-18 ENCOUNTER — Ambulatory Visit: Payer: 59 | Admitting: Physician Assistant

## 2020-03-18 ENCOUNTER — Ambulatory Visit (INDEPENDENT_AMBULATORY_CARE_PROVIDER_SITE_OTHER): Payer: 59 | Admitting: Orthopaedic Surgery

## 2020-03-18 ENCOUNTER — Ambulatory Visit: Payer: Self-pay

## 2020-03-18 DIAGNOSIS — Z96641 Presence of right artificial hip joint: Secondary | ICD-10-CM | POA: Diagnosis not present

## 2020-03-18 NOTE — Progress Notes (Signed)
The patient is now 6 months status post a right total hip arthroplasty to treat severe end-stage arthritis of the right hip.  She is to walk with a limp.  She is happy that she does not walk with a limp anymore.  She reports excellent range of motion and strength of her hip.  She said there is still a little bit of numbness but overall she is very satisfied and happy.  She is an active 60 years old.  Her right operative hip moves smoothly and fluidly.  Her leg lengths are equal.  Her incisions healed nicely.  There is some slight numbness around the incision to be expected at 6 months.  An AP pelvis and lateral the right hip shows a well-seated total hip arthroplasty with no complicating features.  Since she is doing so well, follow-up can be as needed.  I talked her at length in detail about things that we need to bring her back for that hip.  She understands we can also see her for other orthopedic complaints.  All questions and concerns were answered and addressed.

## 2020-03-20 ENCOUNTER — Other Ambulatory Visit: Payer: Self-pay

## 2020-03-20 ENCOUNTER — Encounter: Payer: Self-pay | Admitting: Family Medicine

## 2020-03-20 ENCOUNTER — Ambulatory Visit (INDEPENDENT_AMBULATORY_CARE_PROVIDER_SITE_OTHER): Payer: 59 | Admitting: Family Medicine

## 2020-03-20 VITALS — BP 118/78 | HR 57 | Temp 98.1°F | Ht 62.0 in | Wt 162.2 lb

## 2020-03-20 DIAGNOSIS — I10 Essential (primary) hypertension: Secondary | ICD-10-CM | POA: Diagnosis not present

## 2020-03-20 DIAGNOSIS — E0500 Thyrotoxicosis with diffuse goiter without thyrotoxic crisis or storm: Secondary | ICD-10-CM | POA: Diagnosis not present

## 2020-03-20 DIAGNOSIS — F3170 Bipolar disorder, currently in remission, most recent episode unspecified: Secondary | ICD-10-CM | POA: Diagnosis not present

## 2020-03-20 DIAGNOSIS — G251 Drug-induced tremor: Secondary | ICD-10-CM

## 2020-03-20 DIAGNOSIS — K219 Gastro-esophageal reflux disease without esophagitis: Secondary | ICD-10-CM

## 2020-03-20 NOTE — Patient Instructions (Addendum)
You are doing well today. As heartburn is well controlled, may try taking pantoprazole (protonix) 40mg  every other day to see if this stays well controlled. If you start having worsening symptoms, may restart daily dosing.  Return after 08/18/2020 for wellness visit/physical

## 2020-03-20 NOTE — Assessment & Plan Note (Addendum)
Continues lithium, celexa, buspar, wellbutrin.

## 2020-03-20 NOTE — Progress Notes (Signed)
Patient ID: Katelyn Price, female    DOB: [birth year], 60 y.o.   MRN: [id number]  This visit was conducted in person.  BP 118/78   Pulse (!) 57   Temp 98.1 F (36.7 C) (Temporal)   Ht 5\' 2"  (1.575 m)   Wt 162 lb 4 oz (73.6 kg)   SpO2 97%   BMI 29.68 kg/m    CC: 75mo f/u visit  Subjective:   HPI: Katelyn Price is a 60 y.o. female presenting on 03/20/2020 for Follow-up (Here for 4 mo f/u.)   Known graves disease followed by rheum on methimazole as well as lithium induced tremors. Continues lithium 450mg  nightly.   Did not try lower dose PPI.  HTN - well controlled on current regimen.      Relevant past medical, surgical, family and social history reviewed and updated as indicated. Interim medical history since our last visit reviewed. Allergies and medications reviewed and updated. Outpatient Medications Prior to Visit  Medication Sig Dispense Refill  . buPROPion (WELLBUTRIN XL) 150 MG 24 hr tablet Take 2 tablets (300 mg total) by mouth daily.    . busPIRone (BUSPAR) 7.5 MG tablet Take 7.5 mg by mouth 3 (three) times daily.    . Calcium-Magnesium-Zinc 333-133-5 MG TABS Take by mouth. Takes 1 tablet 3 times a week    . Cholecalciferol (VITAMIN D3) 125 MCG (5000 UT) CAPS Take 5,000 Units by mouth daily.     . citalopram (CELEXA) 20 MG tablet Take 1 tablet (20 mg total) by mouth daily.    . Cyanocobalamin (VITAMELTS ENERGY VITAMIN B-12) 1500 MCG TBDP Take 1 tablet by mouth every Monday, Wednesday, and Friday.    . estradiol (ESTRACE) 2 MG tablet Take 2 mg by mouth daily.    . ferrous sulfate 324 (65 Fe) MG TBEC Take 1 tablet (325 mg total) by mouth daily. (Patient taking differently: Take 1 tablet by mouth 3 (three) times a week.) 30 tablet   . lithium carbonate (ESKALITH) 450 MG CR tablet Take 450 mg by mouth at bedtime.     Marland Kitchen losartan (COZAAR) 25 MG tablet Take 1 tablet (25 mg total) by mouth daily. 90 tablet 3  . methimazole (TAPAZOLE) 5 MG tablet TAKE 1/2 TABLET BY MOUTH  EVERY DAY (Patient taking differently: Take 2.5 mg by mouth at bedtime.) 90 tablet 3  . pantoprazole (PROTONIX) 40 MG tablet Take 1 tablet (40 mg total) by mouth daily. 90 tablet 3  . progesterone (PROMETRIUM) 200 MG capsule Take 200 mg by mouth at bedtime.     . propranolol ER (INDERAL LA) 120 MG 24 hr capsule Take 120 mg by mouth at bedtime.     . triamcinolone (KENALOG) 0.1 % SMARTSIG:1 Application Topical 2-3 Times Daily    . ziprasidone (GEODON) 80 MG capsule Take 240 mg by mouth at bedtime.    . progesterone (PROMETRIUM) 200 MG capsule Take 200 mg by mouth at bedtime.     . baclofen (LIORESAL) 10 MG tablet Take by mouth.    . Ca & Phos-Vit D-Mag (CALCIUM) [phone] TABS Take 1 tablet by mouth in the morning, at noon, and at bedtime.     . ondansetron (ZOFRAN ODT) 4 MG disintegrating tablet Take 1 tablet (4 mg total) by mouth every 8 (eight) hours as needed for nausea or vomiting. 20 tablet 0  . polyethylene glycol powder (GLYCOLAX/MIRALAX) 17 GM/SCOOP powder Take 17 g by mouth daily as needed for moderate constipation. [place]  g 1  . psyllium (METAMUCIL SMOOTH TEXTURE) 58.6 % powder Take 1 packet by mouth daily.    Marland Kitchen tretinoin (RETIN-A) 0.05 % cream Apply 1 application topically daily as needed (blemishes).     No facility-administered medications prior to visit.     Per HPI unless specifically indicated in ROS section below Review of Systems Objective:  BP 118/78   Pulse (!) 57   Temp 98.1 F (36.7 C) (Temporal)   Ht 5\' 2"  (1.575 m)   Wt 162 lb 4 oz (73.6 kg)   SpO2 97%   BMI 29.68 kg/m   Wt Readings from Last 3 Encounters:  03/20/20 162 lb 4 oz (73.6 kg)  01/06/20 161 lb (73 kg)  12/12/19 160 lb 9.6 oz (72.8 kg)      Physical Exam Vitals and nursing note reviewed.  Constitutional:      Appearance: Normal appearance. She is not ill-appearing.  Cardiovascular:     Rate and Rhythm: Normal rate and regular rhythm.     Pulses: Normal pulses.     Heart sounds: Normal  heart sounds. No murmur heard.   Pulmonary:     Effort: Pulmonary effort is normal. No respiratory distress.     Breath sounds: Normal breath sounds. No wheezing, rhonchi or rales.  Musculoskeletal:     Right lower leg: No edema.     Left lower leg: No edema.  Skin:    General: Skin is warm and dry.     Findings: No rash.  Neurological:     Mental Status: She is alert.     Motor: Tremor present.     Comments: Marked high amplitude tremor present bilaterally  Psychiatric:        Mood and Affect: Mood normal.        Behavior: Behavior normal.       Assessment & Plan:  This visit occurred during the SARS-CoV-2 public health emergency.  Safety protocols were in place, including screening questions prior to the visit, additional usage of staff PPE, and extensive cleaning of exam room while observing appropriate contact time as indicated for disinfecting solutions.   Problem List Items Addressed This Visit    Bipolar disorder ([place])    Continues lithium, celexa, buspar, wellbutrin.       HYPERTENSION, BENIGN ESSENTIAL - Primary    Chronic, stable. Continue current regimen.       GERD (gastroesophageal reflux disease)    Asxs. Again suggested trial PPI QOD dosing.       Drug-induced tremor    Chronic issue, has seen neurology.  Li related + ET.       Graves disease    Appreciate endo care - continues methimazole.           No orders of the defined types were placed in this encounter.  No orders of the defined types were placed in this encounter.   Follow up plan: Return in about 5 months (around 08/20/2020), or if symptoms worsen or fail to improve, for annual exam, prior fasting for blood work, medicare wellness visit.  Ria Bush, MD

## 2020-03-20 NOTE — Assessment & Plan Note (Addendum)
Chronic issue, has seen neurology.  Li related + ET.

## 2020-03-20 NOTE — Assessment & Plan Note (Signed)
Appreciate endo care - continues methimazole.

## 2020-03-20 NOTE — Assessment & Plan Note (Signed)
Asxs. Again suggested trial PPI QOD dosing.

## 2020-03-20 NOTE — Assessment & Plan Note (Signed)
Chronic, stable. Continue current regimen. 

## 2020-06-11 ENCOUNTER — Other Ambulatory Visit: Payer: Self-pay

## 2020-06-11 ENCOUNTER — Ambulatory Visit (INDEPENDENT_AMBULATORY_CARE_PROVIDER_SITE_OTHER): Payer: 59 | Admitting: Internal Medicine

## 2020-06-11 ENCOUNTER — Encounter: Payer: Self-pay | Admitting: Internal Medicine

## 2020-06-11 VITALS — BP 140/95 | HR 52 | Ht 62.0 in | Wt 163.4 lb

## 2020-06-11 DIAGNOSIS — E042 Nontoxic multinodular goiter: Secondary | ICD-10-CM

## 2020-06-11 DIAGNOSIS — E0500 Thyrotoxicosis with diffuse goiter without thyrotoxic crisis or storm: Secondary | ICD-10-CM | POA: Diagnosis not present

## 2020-06-11 LAB — T4, FREE: Free T4: 0.4 ng/dL — ABNORMAL LOW (ref 0.60–1.60)

## 2020-06-11 LAB — TSH: TSH: 1.55 u[IU]/mL (ref 0.35–4.50)

## 2020-06-11 LAB — T3, FREE: T3, Free: 2.8 pg/mL (ref 2.3–4.2)

## 2020-06-11 NOTE — Patient Instructions (Signed)
Please continue: - Methimazole 2.5 mg daily  Please stop at the lab.  Please come back for a follow-up appointment in 1 year.  

## 2020-06-11 NOTE — Progress Notes (Signed)
Patient ID: Katelyn Price, female   DOB: 17-Sep-1960, 60 y.o.   MRN: [id number]   This visit occurred during the SARS-CoV-2 public health emergency.  Safety protocols were in place, including screening questions prior to the visit, additional usage of staff PPE, and extensive cleaning of exam room while observing appropriate contact time as indicated for disinfecting solutions.   HPI  Katelyn Price is a 60 y.o.-year-old female, presenting for f/u for Graves ds. and thyroid nodules. Last visit 6 months ago.  Interim history: She denies palpitations, heat intolerance, weight loss.  She continues to have tremors, which are longstanding, due to lithium therapy.  Reviewed history: Patient was diagnosed with thyrotoxicosis in summer 2017 and was confirmed as Graves' disease on an uptake and scan from 10/2015.   Reviewed her treatment history: She was initially started on methimazole 10 mg twice a day, then decreased to 5 mg twice a day in 02/2016 after which TFTs normalized.  We were able to decrease the dose further to 5 mg daily.  We have tried an even lower dose but her TSH became suppressed on less than 5 mg methimazole daily..  In 01/2017, we increase the dose of methimazole to 5 mg twice a day of methimazole  In 11/2017, we were able to decrease the dose to 5 mg once a day.  In 03/2018 we were able to decrease the dose to 2.5 mg once a day.  In 10/2018, we continued methimazole 2.5 mg once a day  In 04/2019, we continued the same dose.  Reviewed her TFTs: Lab Results  Component Value Date   TSH 2.14 12/12/2019   TSH 0.54 04/22/2019   TSH 0.44 10/12/2018   TSH 1.11 07/09/2018   TSH 1.55 03/23/2018   TSH 1.75 12/26/2017   TSH 6.31 (H) 11/20/2017   TSH 3.27 08/23/2017   TSH 1.32 05/31/2017   TSH 0.20 (L) 04/25/2017   FREET4 0.48 (L) 12/12/2019   FREET4 0.51 (L) 04/22/2019   FREET4 0.55 (L) 10/12/2018   FREET4 0.41 (L) 07/09/2018   FREET4 0.46 (L) 03/23/2018   FREET4 0.42  (L) 12/26/2017   FREET4 0.42 (L) 11/20/2017   FREET4 0.41 (L) 08/23/2017   FREET4 0.51 (L) 05/31/2017   FREET4 0.53 (L) 04/25/2017   Lab Results  Component Value Date   T3FREE 3.3 12/12/2019   T3FREE 3.4 04/22/2019   T3FREE 3.3 10/12/2018   T3FREE 2.8 07/09/2018   T3FREE 3.7 03/23/2018   T3FREE 3.2 12/26/2017   T3FREE 3.2 11/20/2017   T3FREE 3.0 08/23/2017   T3FREE 3.7 05/31/2017   T3FREE 3.7 04/25/2017   Her Graves' antibodies were elevated: Lab Results  Component Value Date   TSI 411 (H) 07/09/2018   TSI 499 (H) 05/31/2017   TSI 442 (H) 11/14/2016   TSI 630 (H) 10/14/2015   Component     Latest Ref Rng & Units 08/25/2015  Thyrotropin Receptor Ab     <=16.0 % 41.4 (H)   Reviewed and addended history: Thyroid ultrasound on 09/04/2015: Right thyroid lobe: 4.1 cm x 1.7 cm x 1.9 cm. Relatively homogeneous appearance of right thyroid tissue.  Right thyroid nodule #1 measures 0.7 cm x 0.7 cm x 0.7 cm mixed cystic and solid composition (1), with hypoechoic echogenicity (2),  taller than wide shape (3) Left thyroid lobe: 5.1 cm x 1.9 cm x 2.4 cm. Relatively homogeneous appearance of the left thyroid.  *Inferior left nodule #1 measures 1.5 cm x 1.1 cm x 1.3  cm Nearly completely solid composition (2), with isoechoic  echogenicity (1).   There are 3 additional separate nodules all measuring less than 5 mm with characteristics compatible with colloid cysts. Isthmus Thickness: 0.4 cm.  No nodules visualized. Lymphadenopathy: None visualized.  Thyroid Uptake and scan (10/22/2015) >> Graves' disease.: 24 hour radioactive iodine uptake 36.9%.Diffuse radiotracer uptake identified within both lobes of the thyroid gland.  No dominant  Thyroid U/S (10/26/2018): Stable, small nodules: Parenchymal Echotexture: Moderately heterogenous Isthmus: Normal in size measuring 0.4 cm in diameter Right lobe: Normal in size measuring 5.2 x 2.1 x 2.2 cm, unchanged previously, 4.1 x 1.7 x 1.9 cm Left  lobe: Normal in size measuring 5.4 x 2.0 x 2.4 cm, unchanged, previously, 5.1 x 1.9 x 2.4 cm _________________________________________________________  Previously noted approximately 0.7 cm minimally complex cyst within the inferior pole the right lobe of the thyroid is not demonstrated on the present examination. __________________________________________________  The previously noted approximately 1.5 x 1.3 x 1.1 cm isoechoic nodule within the mid, inferior aspect the left lobe of the thyroid is not seen on the present examination and thus may have represented a pseudonodule.  There is an approximately 1.0 x 1.0 x 0.8 cm isoechoic ill-defined nodule/pseudonodule within the mid aspect the left lobe of the thyroid (labeled 1) was not definitely seen on the 2017 examination however does not meet imaging criteria to recommend percutaneous sampling or continued dedicated follow-up.  There is an approximately 1.0 x 0.9 x 0.9 cm partially cystic, partially solid nodule within the inferior pole the left lobe of the thyroid (labeled 2), which was not definitely seen on the 2017 examination, however does not meet imaging criteria to recommend percutaneous sampling or continued dedicated follow-up.  IMPRESSION: 1. Findings suggestive of multinodular goiter. 2. No worrisome new or enlarging thyroid nodules. 3. None of the discretely measured thyroid nodules meet imaging criteria to recommend percutaneous sampling or continued dedicated follow-up.  Electronically Signed   By: Sandi Mariscal M.D.   On: 10/26/2018 10:45      She is on propranolol 120 mg in a.m. for tremors.  On lithium for bipolar disorder.  Pt denies: - feeling nodules in neck - hoarseness - dysphagia - choking - SOB with lying down  Pt does have a FH of thyroid ds on father's side of the family. No FH of thyroid cancer. No h/o radiation tx to head or neck.  No seaweed or kelp. No recent contrast studies. No  herbal supplements. No Biotin use. No recent steroids use.   Pt. also has a history of GERD, psoriasis, bipolar dis., HTN, B12 deficiency - on 1500 mcg daily.  B12 levels were normal at last check: Lab Results  Component Value Date   VITAMINB12 675 08/16/2019   VITAMINB12 564 08/09/2018   VITAMINB12 1,157 (H) 10/05/2017   VITAMINB12 968 (H) 08/22/2016   VITAMINB12 >1500 (H) 08/21/2015   VITAMINB12 >1500 (H) 08/11/2014   VITAMINB12 189 (L) 07/26/2013   VITAMINB12 127 (L) 09/03/2012   + History of vaginal spotting-seeing OB/GYN (Dr. Milta Deiters).  On HRT. She had right TKR in 09/2019 >> she felt  much better after the sx.  ROS: Constitutional: no weight gain/no weight loss, no fatigue, no subjective hyperthermia, no subjective hypothermia Eyes: no blurry vision, no xerophthalmia ENT: no sore throat, + see HPI Cardiovascular: no CP/no SOB/no palpitations/no leg swelling Respiratory: no cough/no SOB/no wheezing Gastrointestinal: no N/no V/no D/no C/no acid reflux Musculoskeletal: no muscle aches/no joint aches Skin: no rashes,  no hair loss Neurological: + tremors/no numbness/no tingling/no dizziness  I reviewed pt's medications, allergies, PMH, social hx, family hx, and changes were documented in the history of present illness. Otherwise, unchanged from my initial visit note.  Past Medical History:  Diagnosis Date  . Abnormal involuntary movements(781.0)   . Acne   . Allergic rhinitis   . Arthritis   . Asthma   . Bipolar disorder, unspecified ([place])    psych - Dr. Lattie Haw  . Common migraine with intractable migraine 10/09/2018  . Depression   . Diaphragmatic hernia without mention of obstruction or gangrene   . GERD (gastroesophageal reflux disease)    03/18/02, EGD, [facility] Gerd,gastritis, dilated  . History of MRI of brain and brain stem 06/01/03   [facility]  . Hypertension   . Other psoriasis   . Tremor of both hands 10/09/2018  . Unspecified sinusitis (chronic)    Past Surgical  History:  Procedure Laterality Date  . COLONOSCOPY  03/18/02   internal hemorrhoids  . COLONOSCOPY  06/2014   diverticulosis, rpt 10 yrs Collene Mares)  . ESOPHAGOGASTRODUODENOSCOPY  1/99   [facility]; HH,GERD  . ESOPHAGOGASTRODUODENOSCOPY  03/18/02   HH,GERD,Gastritis; Dilated  . LAPAROSCOPIC ESOPHAGOGASTRIC FUNDOPLASTY  08/28/02   [name]  . TOTAL HIP ARTHROPLASTY Right 10/04/2019   Procedure: RIGHT TOTAL HIP ARTHROPLASTY ANTERIOR APPROACH;  Surgeon: Mcarthur Rossetti, MD;  Location: [facility];  Service: Orthopedics;  Laterality: Right;   Social History   Social History  . Marital status: Married    Spouse name: N/A  . Number of children: 0   Occupational History  . Housewife    Social History Main Topics  . Smoking status: Former Smoker    Packs/day: 1.00    Years: 20.00    Types: Cigarettes    Quit date: 01/10/1998  . Smokeless tobacco: Never Used  . Alcohol use No  . Drug use: No   Social History Narrative   Caffeine: drinks diet drinks   Lives with husband and 1 dog, 1 cat   Occupation: housewife   Activity: no regular exercise, doesn't feel safe to walk outside at home, could walk on mother's treadmill   Diet: good water, fruits/vegetables some    Current Outpatient Medications on File Prior to Visit  Medication Sig Dispense Refill  . buPROPion (WELLBUTRIN XL) 150 MG 24 hr tablet Take 2 tablets (300 mg total) by mouth daily.    . busPIRone (BUSPAR) 7.5 MG tablet Take 7.5 mg by mouth 3 (three) times daily.    . Calcium-Magnesium-Zinc 333-133-5 MG TABS Take by mouth. Takes 1 tablet 3 times a week    . Cholecalciferol (VITAMIN D3) 125 MCG (5000 UT) CAPS Take 5,000 Units by mouth daily.     . citalopram (CELEXA) 20 MG tablet Take 1 tablet (20 mg total) by mouth daily.    . Cyanocobalamin (VITAMELTS ENERGY VITAMIN B-12) 1500 MCG TBDP Take 1 tablet by mouth every Monday, Wednesday, and Friday.    . estradiol (ESTRACE) 2 MG tablet Take 2 mg by mouth daily.    . ferrous sulfate 324  (65 Fe) MG TBEC Take 1 tablet (325 mg total) by mouth daily. (Patient taking differently: Take 1 tablet by mouth 3 (three) times a week.) 30 tablet   . lithium carbonate (ESKALITH) 450 MG CR tablet Take 450 mg by mouth at bedtime.     Marland Kitchen losartan (COZAAR) 25 MG tablet Take 1 tablet (25 mg total) by mouth daily. 90 tablet 3  . methimazole (  TAPAZOLE) 5 MG tablet TAKE 1/2 TABLET BY MOUTH EVERY DAY (Patient taking differently: Take 2.5 mg by mouth at bedtime.) 90 tablet 3  . pantoprazole (PROTONIX) 40 MG tablet Take 1 tablet (40 mg total) by mouth daily. 90 tablet 3  . progesterone (PROMETRIUM) 200 MG capsule Take 200 mg by mouth at bedtime.     . propranolol ER (INDERAL LA) 120 MG 24 hr capsule Take 120 mg by mouth at bedtime.     . triamcinolone (KENALOG) 0.1 % SMARTSIG:1 Application Topical 2-3 Times Daily    . ziprasidone (GEODON) 80 MG capsule Take 240 mg by mouth at bedtime.     No current facility-administered medications on file prior to visit.   Allergies  Allergen Reactions  . Amlodipine Swelling    Ankle swelling  . Hydrocodone-Acetaminophen     Pt says it does not work for her  . Minocycline Hcl Itching   Family History  Problem Relation Age of Onset  . Arthritis Mother        fibromyalgia  . Alcohol abuse Father   . Emphysema Brother        smoker  . COPD Brother        smoker, continues.  . Arthritis Sister        fibromyalgia  . Cancer Neg Hx   . Coronary artery disease Neg Hx   . Stroke Neg Hx   . Diabetes Neg Hx    PE: BP (!) 140/95 (BP Location: Right Arm, Patient Position: Sitting, Cuff Size: Normal)   Pulse (!) 52   Ht 5\' 2"  (1.575 m)   Wt 163 lb 6.4 oz (74.1 kg)   SpO2 98%   BMI 29.89 kg/m  Wt Readings from Last 3 Encounters:  06/11/20 163 lb 6.4 oz (74.1 kg)  03/20/20 162 lb 4 oz (73.6 kg)  01/06/20 161 lb (73 kg)   Constitutional: overweight, in NAD Eyes: PERRLA, EOMI, no exophthalmos ENT: moist mucous membranes, no thyromegaly, no cervical  lymphadenopathy Cardiovascular: RRR, No MRG Respiratory: CTA B Gastrointestinal: abdomen soft, NT, ND, BS+ Musculoskeletal: no deformities, strength intact in all 4 Skin: moist, warm, no rashes Neurological: +++ tremor with outstretched hands, DTR normal in all 4  ASSESSMENT: 1.  Graves' disease  2. Thyroid nodules  PLAN:  1. Patient with history of thyrotoxicosis diagnosed when she presented with thyrotoxic symptoms: Tremors (however, she has chronic tremors due to lithium), heat intolerance, palpitations, shortness of breath.  She started methimazole at we were able to decrease the dose gradually to 2.5 mg daily.  She continues on this dose now. -She feels well at this visit, except for tremors, which are unchanged -In 2020, her TSI titer and this was still elevated, only slightly improved -At today's visit, we will repeat her TFTs and change the methimazole dose accordingly -She continues on beta-blockers for tremors, but these should also help with tachycardia -I will see her back in a year  2. Thyroid nodules -She denies neck compression symptoms -She has a 1.5 cm iso or slightly hypoechoic thyroid nodule, with internal blood flow on her ultrasound from 08/2015.  This nodule appeared warm on the uptake and scan which can either indicate an inflammatory nodule (pseudonodule) or a normal functioning nodule.  Since the nodule was seen in the context of thyrotoxicosis, we decided to wait until she was euthyroid to repeat the ultrasound then.  We repeated her thyroid ultrasound on 10/26/2018 and this showed that 2 of her thyroid nodules were  not visible anymore and 2 nodules one very small, not worrisome. - we will continue to follow her clinically, without the need for further ultrasound  Needs refills.   Component     Latest Ref Rng & Units 06/11/2020  TSH     0.35 - 4.50 uIU/mL 1.55  T4,Free(Direct)     0.60 - 1.60 ng/dL 0.40 (L)  Triiodothyronine,Free,Serum     2.3 - 4.2 pg/mL  2.8   TSH is normal but free T4 is low, lower than before.  We will try to stop her methimazole and recheck her TFTs in 1.5 months.  Philemon Kingdom, MD PhD [facility] Endocrinology

## 2020-06-15 LAB — THYROID STIMULATING IMMUNOGLOBULIN: TSI: 368 % baseline — ABNORMAL HIGH (ref <140)

## 2020-07-27 ENCOUNTER — Other Ambulatory Visit: Payer: Self-pay | Admitting: Family Medicine

## 2020-08-04 ENCOUNTER — Other Ambulatory Visit (INDEPENDENT_AMBULATORY_CARE_PROVIDER_SITE_OTHER): Payer: 59

## 2020-08-04 ENCOUNTER — Other Ambulatory Visit: Payer: Self-pay

## 2020-08-04 DIAGNOSIS — E0500 Thyrotoxicosis with diffuse goiter without thyrotoxic crisis or storm: Secondary | ICD-10-CM | POA: Diagnosis not present

## 2020-08-04 LAB — T3, FREE: T3, Free: 3.2 pg/mL (ref 2.3–4.2)

## 2020-08-04 LAB — TSH: TSH: 0.67 u[IU]/mL (ref 0.35–5.50)

## 2020-08-04 LAB — T4, FREE: Free T4: 0.48 ng/dL — ABNORMAL LOW (ref 0.60–1.60)

## 2020-08-09 ENCOUNTER — Other Ambulatory Visit: Payer: Self-pay | Admitting: Family Medicine

## 2020-08-09 DIAGNOSIS — N289 Disorder of kidney and ureter, unspecified: Secondary | ICD-10-CM

## 2020-08-09 DIAGNOSIS — F3170 Bipolar disorder, currently in remission, most recent episode unspecified: Secondary | ICD-10-CM

## 2020-08-09 DIAGNOSIS — E538 Deficiency of other specified B group vitamins: Secondary | ICD-10-CM

## 2020-08-12 ENCOUNTER — Other Ambulatory Visit: Payer: Self-pay

## 2020-08-12 ENCOUNTER — Other Ambulatory Visit (INDEPENDENT_AMBULATORY_CARE_PROVIDER_SITE_OTHER): Payer: 59

## 2020-08-12 DIAGNOSIS — N289 Disorder of kidney and ureter, unspecified: Secondary | ICD-10-CM | POA: Diagnosis not present

## 2020-08-12 DIAGNOSIS — E538 Deficiency of other specified B group vitamins: Secondary | ICD-10-CM | POA: Diagnosis not present

## 2020-08-12 DIAGNOSIS — F3170 Bipolar disorder, currently in remission, most recent episode unspecified: Secondary | ICD-10-CM

## 2020-08-12 LAB — LIPID PANEL
Cholesterol: 178 mg/dL (ref 0–200)
HDL: 87.4 mg/dL (ref >39.00)
LDL Cholesterol: 66 mg/dL (ref 0–99)
NonHDL: 90.91
Total CHOL/HDL Ratio: 2
Triglycerides: 126 mg/dL (ref 0.0–149.0)
VLDL: 25.2 mg/dL (ref 0.0–40.0)

## 2020-08-12 LAB — COMPREHENSIVE METABOLIC PANEL
ALT: 10 U/L (ref 0–35)
AST: 14 U/L (ref 0–37)
Albumin: 3.8 g/dL (ref 3.5–5.2)
Alkaline Phosphatase: 60 U/L (ref 39–117)
BUN: 22 mg/dL (ref 6–23)
CO2: 24 mEq/L (ref 19–32)
Calcium: 10.2 mg/dL (ref 8.4–10.5)
Chloride: 104 mEq/L (ref 96–112)
Creatinine, Ser: 0.98 mg/dL (ref 0.40–1.20)
GFR: 63.17 mL/min (ref >60.00)
Glucose, Bld: 102 mg/dL — ABNORMAL HIGH (ref 70–99)
Potassium: 5.2 mEq/L — ABNORMAL HIGH (ref 3.5–5.1)
Sodium: 135 mEq/L (ref 135–145)
Total Bilirubin: 0.4 mg/dL (ref 0.2–1.2)
Total Protein: 6.9 g/dL (ref 6.0–8.3)

## 2020-08-12 LAB — CBC WITH DIFFERENTIAL/PLATELET
Basophils Absolute: 0 10*3/uL (ref 0.0–0.1)
Basophils Relative: 0.3 % (ref 0.0–3.0)
Eosinophils Absolute: 0.2 10*3/uL (ref 0.0–0.7)
Eosinophils Relative: 2.9 % (ref 0.0–5.0)
HCT: 39.5 % (ref 36.0–46.0)
Hemoglobin: 12.7 g/dL (ref 12.0–15.0)
Lymphocytes Relative: 20 % (ref 12.0–46.0)
Lymphs Abs: 1.7 10*3/uL (ref 0.7–4.0)
MCHC: 32.1 g/dL (ref 30.0–36.0)
MCV: 98.7 fl (ref 78.0–100.0)
Monocytes Absolute: 0.7 10*3/uL (ref 0.1–1.0)
Monocytes Relative: 7.9 % (ref 3.0–12.0)
Neutro Abs: 5.7 10*3/uL (ref 1.4–7.7)
Neutrophils Relative %: 68.9 % (ref 43.0–77.0)
Platelets: 291 10*3/uL (ref 150.0–400.0)
RBC: 4 Mil/uL (ref 3.87–5.11)
RDW: 13.9 % (ref 11.5–15.5)
WBC: 8.3 10*3/uL (ref 4.0–10.5)

## 2020-08-12 LAB — VITAMIN B12: Vitamin B-12: 553 pg/mL (ref 211–911)

## 2020-08-12 LAB — VITAMIN D 25 HYDROXY (VIT D DEFICIENCY, FRACTURES): VITD: 86.55 ng/mL (ref 30.00–100.00)

## 2020-08-13 LAB — LITHIUM LEVEL: Lithium Lvl: 0.9 mmol/L (ref 0.6–1.2)

## 2020-08-21 ENCOUNTER — Other Ambulatory Visit: Payer: Self-pay

## 2020-08-21 ENCOUNTER — Encounter: Payer: Self-pay | Admitting: Family Medicine

## 2020-08-21 ENCOUNTER — Ambulatory Visit (INDEPENDENT_AMBULATORY_CARE_PROVIDER_SITE_OTHER): Payer: 59 | Admitting: Family Medicine

## 2020-08-21 VITALS — BP 128/84 | HR 55 | Temp 97.7°F | Ht 61.5 in | Wt 166.1 lb

## 2020-08-21 DIAGNOSIS — I10 Essential (primary) hypertension: Secondary | ICD-10-CM | POA: Diagnosis not present

## 2020-08-21 DIAGNOSIS — L408 Other psoriasis: Secondary | ICD-10-CM

## 2020-08-21 DIAGNOSIS — Z0000 Encounter for general adult medical examination without abnormal findings: Secondary | ICD-10-CM

## 2020-08-21 DIAGNOSIS — K219 Gastro-esophageal reflux disease without esophagitis: Secondary | ICD-10-CM | POA: Diagnosis not present

## 2020-08-21 DIAGNOSIS — F3170 Bipolar disorder, currently in remission, most recent episode unspecified: Secondary | ICD-10-CM

## 2020-08-21 DIAGNOSIS — E538 Deficiency of other specified B group vitamins: Secondary | ICD-10-CM

## 2020-08-21 DIAGNOSIS — N289 Disorder of kidney and ureter, unspecified: Secondary | ICD-10-CM

## 2020-08-21 DIAGNOSIS — Z96641 Presence of right artificial hip joint: Secondary | ICD-10-CM

## 2020-08-21 DIAGNOSIS — G251 Drug-induced tremor: Secondary | ICD-10-CM

## 2020-08-21 DIAGNOSIS — E0500 Thyrotoxicosis with diffuse goiter without thyrotoxic crisis or storm: Secondary | ICD-10-CM

## 2020-08-21 MED ORDER — LOSARTAN POTASSIUM 25 MG PO TABS: 25.0000 mg | ORAL_TABLET | Freq: Every day | ORAL | 3 refills | Status: DC

## 2020-08-21 MED ORDER — PANTOPRAZOLE SODIUM 40 MG PO TBEC: 40.0000 mg | DELAYED_RELEASE_TABLET | Freq: Every day | ORAL | 3 refills | Status: DC

## 2020-08-21 MED ORDER — FERROUS SULFATE 324 (65 FE) MG PO TBEC: 1.0000 | DELAYED_RELEASE_TABLET | ORAL | Status: AC

## 2020-08-21 NOTE — Assessment & Plan Note (Signed)
Uses steroid cream through dermatology

## 2020-08-21 NOTE — Progress Notes (Signed)
Patient ID: Katelyn Price, female    DOB: [birth year], 60 y.o.   MRN: [id number]  This visit was conducted in person.  BP 128/84   Pulse (!) 55   Temp 97.7 F (36.5 C) (Temporal)   Ht 5' 1.5" (1.562 m)   Wt 166 lb 1 oz (75.3 kg)   SpO2 98%   BMI 30.87 kg/m    CC: CPE Subjective:   HPI: Katelyn Price is a 60 y.o. female presenting on 08/21/2020 for Annual Exam   S/p R hip replacement 09/2019 Katelyn Price) - recovered well.   HTN - she stopped amlodipine due to ankle swelling. Does not check BP at home.    Graves now off methimazole 2.'5mg'$  once daily - sees endo Katelyn Price), h/o thyroid nodules   Tremors - saw Katelyn Price who thought tremor coming from lithium - advised taper off. Started on topamax for migraines - but actually worsened headaches so now off this.   Bipolar - followed by psychiatry Katelyn Price on lithium, celexa, buspar and wellbutrin.    Preventative: COLONOSCOPY 06/2014 diverticulosis, rpt 10 yrs Katelyn Price)  Well woman with OBGYN 03/2019 (Katelyn Price), normal pap smear per patient. Continues estrace '2mg'$  daily with progesterone one week per month to have periods. notes spotting once a month.  Mammogram - through OBGYN - will request records Lung cancer screening - not eligible  Flu shot yearly Tdap - 06/2011  COVID vaccine - J&J 05/2019. No boosters Shingrix - discussed.  Seat belt use discussed  Sunscreen use discussed, no changing moles on skin  Ex smoker - quit 2007. Husband smokes pipe and e-cig Alcohol - none  Dentist overdue  Eye exam - overdue  Bowel - no constipation - takes miralax regularly  Bladder - no incontinence   Caffeine: drinks diet drinks   Lives with husband and 1 dog, 1 cat   Occupation: housewife  Activity: no regular exercise  Diet: good water, fruits/vegetables some      Relevant past medical, surgical, family and social history reviewed and updated as indicated. Interim medical history since our last visit reviewed. Allergies and  medications reviewed and updated. Outpatient Medications Prior to Visit  Medication Sig Dispense Refill   buPROPion (WELLBUTRIN XL) 150 MG 24 hr tablet Take 2 tablets (300 mg total) by mouth daily.     busPIRone (BUSPAR) 7.5 MG tablet Take 7.5 mg by mouth 3 (three) times daily.     Calcium-Magnesium-Zinc 333-133-5 MG TABS Take by mouth. Takes 1 tablet 3 times a week     Cholecalciferol (VITAMIN D3) 125 MCG (5000 UT) CAPS Take 5,000 Units by mouth daily.      citalopram (CELEXA) 20 MG tablet Take 1 tablet (20 mg total) by mouth daily.     Cyanocobalamin (VITAMELTS ENERGY VITAMIN B-12) 1500 MCG TBDP Take 1 tablet by mouth every Monday, Wednesday, and Friday.     estradiol (ESTRACE) 2 MG tablet Take 2 mg by mouth daily.     lithium carbonate (ESKALITH) 450 MG CR tablet Take 450 mg by mouth at bedtime.      progesterone (PROMETRIUM) 200 MG capsule Take 200 mg by mouth at bedtime.      propranolol ER (INDERAL LA) 120 MG 24 hr capsule Take 120 mg by mouth at bedtime.      tretinoin (RETIN-A) 0.05 % cream Apply 1 application topically at bedtime.     triamcinolone (KENALOG) 0.1 % SMARTSIG:1 Application Topical 2-3 Times Daily  ziprasidone (GEODON) 80 MG capsule Take 240 mg by mouth at bedtime.     ferrous sulfate 324 (65 Fe) MG TBEC Take 1 tablet (325 mg total) by mouth daily. (Patient taking differently: Take 1 tablet by mouth 3 (three) times a week.) 30 tablet    losartan (COZAAR) 25 MG tablet TAKE 1 TABLET BY MOUTH EVERY DAY 90 tablet 0   pantoprazole (PROTONIX) 40 MG tablet Take 1 tablet (40 mg total) by mouth daily. 90 tablet 3   No facility-administered medications prior to visit.     Per HPI unless specifically indicated in ROS section below Review of Systems  Constitutional:  Negative for activity change, appetite change, chills, fatigue, fever and unexpected weight change.  HENT:  Negative for hearing loss.   Eyes:  Negative for visual disturbance.  Respiratory:  Negative for cough,  chest tightness, shortness of breath and wheezing.   Cardiovascular:  Negative for chest pain, palpitations and leg swelling.  Gastrointestinal:  Negative for abdominal distention, abdominal pain, blood in stool, constipation, diarrhea, nausea and vomiting.  Genitourinary:  Positive for dyspareunia. Negative for difficulty urinating and hematuria.  Musculoskeletal:  Negative for arthralgias, myalgias and neck pain.  Skin:  Negative for rash.  Neurological:  Positive for headaches (migraines). Negative for dizziness, seizures and syncope.  Hematological:  Negative for adenopathy. Does not bruise/bleed easily.  Psychiatric/Behavioral:  Negative for dysphoric mood. The patient is not nervous/anxious.    Objective:  BP 128/84   Pulse (!) 55   Temp 97.7 F (36.5 C) (Temporal)   Ht 5' 1.5" (1.562 m)   Wt 166 lb 1 oz (75.3 kg)   SpO2 98%   BMI 30.87 kg/m   Wt Readings from Last 3 Encounters:  08/21/20 166 lb 1 oz (75.3 kg)  06/11/20 163 lb 6.4 oz (74.1 kg)  03/20/20 162 lb 4 oz (73.6 kg)      Physical Exam Vitals and nursing note reviewed.  Constitutional:      Appearance: Normal appearance. She is not ill-appearing.  HENT:     Head: Normocephalic and atraumatic.     Right Ear: Tympanic membrane, ear canal and external ear normal. There is no impacted cerumen.     Left Ear: Tympanic membrane, ear canal and external ear normal. There is no impacted cerumen.  Eyes:     General:        Right eye: No discharge.        Left eye: No discharge.     Extraocular Movements: Extraocular movements intact.     Conjunctiva/sclera: Conjunctivae normal.     Pupils: Pupils are equal, round, and reactive to light.  Neck:     Thyroid: No thyroid mass or thyromegaly.  Cardiovascular:     Rate and Rhythm: Normal rate and regular rhythm.     Pulses: Normal pulses.     Heart sounds: Normal heart sounds. No murmur heard. Pulmonary:     Effort: Pulmonary effort is normal. No respiratory distress.      Breath sounds: Normal breath sounds. No wheezing, rhonchi or rales.  Abdominal:     General: Bowel sounds are normal. There is no distension.     Palpations: Abdomen is soft. There is no mass.     Tenderness: There is no abdominal tenderness. There is no guarding or rebound.     Hernia: No hernia is present.  Musculoskeletal:     Cervical back: Normal range of motion and neck supple. No rigidity.  Right lower leg: No edema.     Left lower leg: No edema.  Lymphadenopathy:     Cervical: No cervical adenopathy.  Skin:    General: Skin is warm and dry.     Findings: No rash.  Neurological:     General: No focal deficit present.     Mental Status: She is alert. Mental status is at baseline.  Psychiatric:        Mood and Affect: Mood normal.        Behavior: Behavior normal.      Results for orders placed or performed in visit on 08/12/20  VITAMIN D 25 Hydroxy (Vit-D Deficiency, Fractures)  Result Value Ref Range   VITD 86.55 30.00 - 100.00 ng/mL  Lithium level  Result Value Ref Range   Lithium Lvl 0.9 0.6 - 1.2 mmol/L  Vitamin B12  Result Value Ref Range   Vitamin B-12 553 211 - 911 pg/mL  Comprehensive metabolic panel  Result Value Ref Range   Sodium 135 135 - 145 mEq/L   Potassium 5.2 No hemolysis seen (H) 3.5 - 5.1 mEq/L   Chloride 104 96 - 112 mEq/L   CO2 24 19 - 32 mEq/L   Glucose, Bld 102 (H) 70 - 99 mg/dL   BUN 22 6 - 23 mg/dL   Creatinine, Ser 0.98 0.40 - 1.20 mg/dL   Total Bilirubin 0.4 0.2 - 1.2 mg/dL   Alkaline Phosphatase 60 39 - 117 U/L   AST 14 0 - 37 U/L   ALT 10 0 - 35 U/L   Total Protein 6.9 6.0 - 8.3 g/dL   Albumin 3.8 3.5 - 5.2 g/dL   GFR 63.17 >60.00 mL/min   Calcium 10.2 8.4 - 10.5 mg/dL  Lipid panel  Result Value Ref Range   Cholesterol 178 0 - 200 mg/dL   Triglycerides 126.0 0.0 - 149.0 mg/dL   HDL 87.40 >39.00 mg/dL   VLDL 25.2 0.0 - 40.0 mg/dL   LDL Cholesterol 66 0 - 99 mg/dL   Total CHOL/HDL Ratio 2    NonHDL 90.91   CBC with  Differential/Platelet  Result Value Ref Range   WBC 8.3 4.0 - 10.5 K/uL   RBC 4.00 3.87 - 5.11 Mil/uL   Hemoglobin 12.7 12.0 - 15.0 g/dL   HCT 39.5 36.0 - 46.0 %   MCV 98.7 78.0 - 100.0 fl   MCHC 32.1 30.0 - 36.0 g/dL   RDW 13.9 11.5 - 15.5 %   Platelets 291.0 150.0 - 400.0 K/uL   Neutrophils Relative % 68.9 43.0 - 77.0 %   Lymphocytes Relative 20.0 12.0 - 46.0 %   Monocytes Relative 7.9 3.0 - 12.0 %   Eosinophils Relative 2.9 0.0 - 5.0 %   Basophils Relative 0.3 0.0 - 3.0 %   Neutro Abs 5.7 1.4 - 7.7 K/uL   Lymphs Abs 1.7 0.7 - 4.0 K/uL   Monocytes Absolute 0.7 0.1 - 1.0 K/uL   Eosinophils Absolute 0.2 0.0 - 0.7 K/uL   Basophils Absolute 0.0 0.0 - 0.1 K/uL   Lab Results  Component Value Date   TSH 0.67 08/04/2020    Depression screen [facility] 2/9 08/21/2020 08/19/2019 08/15/2018 10/12/2017 08/25/2016  Decreased Interest 1 0 0 1 1  Down, Depressed, Hopeless 1 0 0 1 0  PHQ - 2 Score 2 0 0 2 1  Altered sleeping 0 0 - 0 -  Tired, decreased energy 0 0 - 0 -  Change in appetite 0 1 - 1 -  Feeling bad or failure about yourself  1 0 - 0 -  Trouble concentrating 0 0 - 0 -  Moving slowly or fidgety/restless 0 0 - 0 -  Suicidal thoughts 0 0 - 1 -  PHQ-9 Score 3 1 - 4 -    Assessment & Plan:  This visit occurred during the SARS-CoV-2 public health emergency.  Safety protocols were in place, including screening questions prior to the visit, additional usage of staff PPE, and extensive cleaning of exam room while observing appropriate contact time as indicated for disinfecting solutions.   Problem List Items Addressed This Visit     Bipolar disorder ([place])    Chronic, sees psychiatry. On lithium, celexa, buspar and wellbutrin.       HYPERTENSION, BENIGN ESSENTIAL    Chronic, stable on low dose losartan.      Relevant Medications   losartan (COZAAR) 25 MG tablet   GERD (gastroesophageal reflux disease)    Chronic, stable on daily ppi       Relevant Medications   pantoprazole (PROTONIX)  40 MG tablet   PSORIASIS NEC    Uses steroid cream through dermatology      Drug-induced tremor    Thought lithium induced. Has seen neurology.       Healthcare maintenance - Primary    Preventative protocols reviewed and updated unless pt declined. Discussed healthy diet and lifestyle.       Renal insufficiency    This has resolved - continue to monitor.       B12 deficiency    Repleted with MWF replacement.       Graves disease    States released from endo care however endo note says RTC 1 yr. Now off methimazole.       Status post right hip replacement     Meds ordered this encounter  Medications   ferrous sulfate 324 (65 Fe) MG TBEC    Sig: Take 1 tablet (325 mg total) by mouth 3 (three) times a week.   losartan (COZAAR) 25 MG tablet    Sig: Take 1 tablet (25 mg total) by mouth daily.    Dispense:  90 tablet    Refill:  3   pantoprazole (PROTONIX) 40 MG tablet    Sig: Take 1 tablet (40 mg total) by mouth daily.    Dispense:  90 tablet    Refill:  3    No orders of the defined types were placed in this encounter.   Patient instructions: If you haven't seen him this year, call to schedule appointment with Katelyn Nori Price.  We will request latest mammogram from Katelyn Verlon Au office.  Schedule dentist and eye exam.  We will send labs to Katelyn Price with psychiatry Good to see you today  Return as needed or in 1 year for next physical.  Follow up plan: Return in about 1 year (around 08/21/2021) for annual exam, prior fasting for blood work.  Ria Bush, MD

## 2020-08-21 NOTE — Assessment & Plan Note (Signed)
Preventative protocols reviewed and updated unless pt declined. Discussed healthy diet and lifestyle.  

## 2020-08-21 NOTE — Assessment & Plan Note (Addendum)
Repleted with MWF replacement.

## 2020-08-21 NOTE — Assessment & Plan Note (Signed)
States released from endo care however endo note says RTC 1 yr. Now off methimazole.

## 2020-08-21 NOTE — Assessment & Plan Note (Signed)
Chronic, stable on low dose losartan.

## 2020-08-21 NOTE — Assessment & Plan Note (Signed)
This has resolved - continue to monitor.

## 2020-08-21 NOTE — Patient Instructions (Addendum)
If you haven't seen him this year, call to schedule appointment with Dr Nori Riis.  We will request latest mammogram from Dr Verlon Au office.  Schedule dentist and eye exam.  We will send labs to Noemi Chapel with psychiatry Good to see you today Return as needed or in 1 year for next physical.  Health Maintenance, Female Adopting a healthy lifestyle and getting preventive care are important in promoting health and wellness. Ask your health care provider about: The right schedule for you to have regular tests and exams. Things you can do on your own to prevent diseases and keep yourself healthy. What should I know about diet, weight, and exercise? Eat a healthy diet  Eat a diet that includes plenty of vegetables, fruits, low-fat dairy products, and lean protein. Do not eat a lot of foods that are high in solid fats, added sugars, or sodium.  Maintain a healthy weight Body mass index (BMI) is used to identify weight problems. It estimates body fat based on height and weight. Your health care provider can help determineyour BMI and help you achieve or maintain a healthy weight. Get regular exercise Get regular exercise. This is one of the most important things you can do for your health. Most adults should: Exercise for at least 150 minutes each week. The exercise should increase your heart rate and make you sweat (moderate-intensity exercise). Do strengthening exercises at least twice a week. This is in addition to the moderate-intensity exercise. Spend less time sitting. Even light physical activity can be beneficial. Watch cholesterol and blood lipids Have your blood tested for lipids and cholesterol at 59 years of age, then havethis test every 5 years. Have your cholesterol levels checked more often if: Your lipid or cholesterol levels are high. You are older than 59 years of age. You are at high risk for heart disease. What should I know about cancer screening? Depending on your health  history and family history, you may need to have cancer screening at various ages. This may include screening for: Breast cancer. Cervical cancer. Colorectal cancer. Skin cancer. Lung cancer. What should I know about heart disease, diabetes, and high blood pressure? Blood pressure and heart disease High blood pressure causes heart disease and increases the risk of stroke. This is more likely to develop in people who have high blood pressure readings, are of African descent, or are overweight. Have your blood pressure checked: Every 3-5 years if you are 48-19 years of age. Every year if you are 34 years old or older. Diabetes Have regular diabetes screenings. This checks your fasting blood sugar level. Have the screening done: Once every three years after age 61 if you are at a normal weight and have a low risk for diabetes. More often and at a younger age if you are overweight or have a high risk for diabetes. What should I know about preventing infection? Hepatitis B If you have a higher risk for hepatitis B, you should be screened for this virus. Talk with your health care provider to find out if you are at risk forhepatitis B infection. Hepatitis C Testing is recommended for: Everyone born from [birth year] through 1964. Anyone with known risk factors for hepatitis C. Sexually transmitted infections (STIs) Get screened for STIs, including gonorrhea and chlamydia, if: You are sexually active and are younger than 59 years of age. You are older than 59 years of age and your health care provider tells you that you are at risk for this type of  infection. Your sexual activity has changed since you were last screened, and you are at increased risk for chlamydia or gonorrhea. Ask your health care provider if you are at risk. Ask your health care provider about whether you are at high risk for HIV. Your health care provider may recommend a prescription medicine to help prevent HIV infection. If you  choose to take medicine to prevent HIV, you should first get tested for HIV. You should then be tested every 3 months for as long as you are taking the medicine. Pregnancy If you are about to stop having your period (premenopausal) and you may become pregnant, seek counseling before you get pregnant. Take 400 to 800 micrograms (mcg) of folic acid every day if you become pregnant. Ask for birth control (contraception) if you want to prevent pregnancy. Osteoporosis and menopause Osteoporosis is a disease in which the bones lose minerals and strength with aging. This can result in bone fractures. If you are 59 years old or older, or if you are at risk for osteoporosis and fractures, ask your health care provider if you should: Be screened for bone loss. Take a calcium or vitamin D supplement to lower your risk of fractures. Be given hormone replacement therapy (HRT) to treat symptoms of menopause. Follow these instructions at home: Lifestyle Do not use any products that contain nicotine or tobacco, such as cigarettes, e-cigarettes, and chewing tobacco. If you need help quitting, ask your health care provider. Do not use street drugs. Do not share needles. Ask your health care provider for help if you need support or information about quitting drugs. Alcohol use Do not drink alcohol if: Your health care provider tells you not to drink. You are pregnant, may be pregnant, or are planning to become pregnant. If you drink alcohol: Limit how much you use to 0-1 drink a day. Limit intake if you are breastfeeding. Be aware of how much alcohol is in your drink. In the [place] one drink equals one 12 oz bottle of beer (355 mL), one 5 oz glass of wine (148 mL), or one 1 oz glass of hard liquor (44 mL). General instructions Schedule regular health, dental, and eye exams. Stay current with your vaccines. Tell your health care provider if: You often feel depressed. You have ever been abused or do not feel  safe at home. Summary Adopting a healthy lifestyle and getting preventive care are important in promoting health and wellness. Follow your health care provider's instructions about healthy diet, exercising, and getting tested or screened for diseases. Follow your health care provider's instructions on monitoring your cholesterol and blood pressure. This information is not intended to replace advice given to you by your health care provider. Make sure you discuss any questions you have with your healthcare provider. Document Revised: 12/20/2017 Document Reviewed: 12/20/2017 Elsevier Patient Education  2022 [employer].

## 2020-08-21 NOTE — Assessment & Plan Note (Signed)
Chronic, sees psychiatry. On lithium, celexa, buspar and wellbutrin.

## 2020-08-21 NOTE — Assessment & Plan Note (Signed)
Thought lithium induced. Has seen neurology.

## 2020-08-21 NOTE — Assessment & Plan Note (Signed)
Chronic, stable on daily ppi

## 2020-10-27 ENCOUNTER — Telehealth: Payer: Self-pay | Admitting: Family Medicine

## 2020-10-27 NOTE — Telephone Encounter (Signed)
Faxed 08/12/20 lab results, via Epic, to [place], attn: Val.

## 2020-10-27 NOTE — Telephone Encounter (Signed)
Val from [facility] called needing most recent lab results that have the lithium levels fax number [phone]

## 2020-11-09 ENCOUNTER — Encounter: Payer: Self-pay | Admitting: Family Medicine

## 2020-11-10 NOTE — Telephone Encounter (Signed)
Updated pt's chart.  

## 2021-06-11 ENCOUNTER — Ambulatory Visit (INDEPENDENT_AMBULATORY_CARE_PROVIDER_SITE_OTHER): Payer: 59 | Admitting: Internal Medicine

## 2021-06-11 ENCOUNTER — Encounter: Payer: Self-pay | Admitting: Internal Medicine

## 2021-06-11 VITALS — BP 118/80 | HR 51 | Ht 61.5 in | Wt 175.8 lb

## 2021-06-11 DIAGNOSIS — E0500 Thyrotoxicosis with diffuse goiter without thyrotoxic crisis or storm: Secondary | ICD-10-CM

## 2021-06-11 DIAGNOSIS — E042 Nontoxic multinodular goiter: Secondary | ICD-10-CM

## 2021-06-11 LAB — T3, FREE: T3, Free: 3.5 pg/mL (ref 2.3–4.2)

## 2021-06-11 LAB — TSH: TSH: 0.16 u[IU]/mL — ABNORMAL LOW (ref 0.35–5.50)

## 2021-06-11 LAB — T4, FREE: Free T4: 0.58 ng/dL — ABNORMAL LOW (ref 0.60–1.60)

## 2021-06-11 MED ORDER — METHIMAZOLE 5 MG PO TABS: 2.5000 mg | ORAL_TABLET | Freq: Every day | ORAL | 3 refills | Status: DC

## 2021-06-11 NOTE — Patient Instructions (Signed)
Please continue off Methimazole.  Please stop at the lab.  Please come back for a follow-up appointment in 1 year.

## 2021-06-11 NOTE — Progress Notes (Addendum)
Patient ID: Katelyn Price, female   DOB: 1960/04/23, 61 y.o.   MRN: [id number]   This visit occurred during the SARS-CoV-2 public health emergency.  Safety protocols were in place, including screening questions prior to the visit, additional usage of staff PPE, and extensive cleaning of exam room while observing appropriate contact time as indicated for disinfecting solutions.   HPI  Katelyn Price is a 61 y.o.-year-old female, presenting for f/u for Graves ds. and thyroid nodules. Last visit 1 year ago.  Interim history: She denies palpitations, weight loss, but has heat intolerance.    She continues to have tremors, which are longstanding, due to lithium therapy.  Reviewed history: Patient was diagnosed with thyrotoxicosis in summer 2017 and was confirmed as Graves' disease on an uptake and scan from 10/2015.   Reviewed her treatment history: She was initially started on methimazole 10 mg twice a day, then decreased to 5 mg twice a day in 02/2016 after which TFTs normalized.  We were able to decrease the dose further to 5 mg daily.  We have tried an even lower dose but her TSH became suppressed on less than 5 mg methimazole daily..  In 01/2017, we increase the dose of methimazole to 5 mg twice a day of methimazole  In 11/2017, we were able to decrease the dose to 5 mg once a day.  In 03/2018 we were able to decrease the dose to 2.5 mg once a day.  In 10/2018, we continued methimazole 2.5 mg once a day  In 04/2019, we continued the same dose.  In 06/2020, we stopped MMI.  Reviewed her TFTs: Lab Results  Component Value Date   TSH 0.67 08/04/2020   TSH 1.55 06/11/2020   TSH 2.14 12/12/2019   TSH 0.54 04/22/2019   TSH 0.44 10/12/2018   TSH 1.11 07/09/2018   TSH 1.55 03/23/2018   TSH 1.75 12/26/2017   TSH 6.31 (H) 11/20/2017   TSH 3.27 08/23/2017   FREET4 0.48 (L) 08/04/2020   FREET4 0.40 (L) 06/11/2020   FREET4 0.48 (L) 12/12/2019   FREET4 0.51 (L) 04/22/2019   FREET4  0.55 (L) 10/12/2018   FREET4 0.41 (L) 07/09/2018   FREET4 0.46 (L) 03/23/2018   FREET4 0.42 (L) 12/26/2017   FREET4 0.42 (L) 11/20/2017   FREET4 0.41 (L) 08/23/2017   Lab Results  Component Value Date   T3FREE 3.2 08/04/2020   T3FREE 2.8 06/11/2020   T3FREE 3.3 12/12/2019   T3FREE 3.4 04/22/2019   T3FREE 3.3 10/12/2018   T3FREE 2.8 07/09/2018   T3FREE 3.7 03/23/2018   T3FREE 3.2 12/26/2017   T3FREE 3.2 11/20/2017   T3FREE 3.0 08/23/2017   Her Graves' antibodies were elevated: Lab Results  Component Value Date   TSI 368 (H) 06/11/2020   TSI 411 (H) 07/09/2018   TSI 499 (H) 05/31/2017   TSI 442 (H) 11/14/2016   TSI 630 (H) 10/14/2015   Component     Latest Ref Rng & Units 08/25/2015  Thyrotropin Receptor Ab     <=16.0 % 41.4 (H)   Reviewed history: Thyroid ultrasound on 09/04/2015: Right thyroid lobe: 4.1 cm x 1.7 cm x 1.9 cm. Relatively homogeneous appearance of right thyroid tissue.   Right thyroid nodule #1 measures 0.7 cm x 0.7 cm x 0.7 cm mixed cystic and solid composition (1), with hypoechoic echogenicity (2),  taller than wide shape (3) Left thyroid lobe: 5.1 cm x 1.9 cm x 2.4 cm. Relatively homogeneous appearance of the left  thyroid.   *Inferior left nodule #1 measures 1.5 cm x 1.1 cm x 1.3 cm Nearly completely solid composition (2), with isoechoic  echogenicity (1).   There are 3 additional separate nodules all measuring less than 5 mm with characteristics compatible with colloid cysts. Isthmus Thickness: 0.4 cm.  No nodules visualized. Lymphadenopathy: None visualized.  Thyroid Uptake and scan (10/22/2015) >> Graves' disease.: 24 hour radioactive iodine uptake 36.9%.Diffuse radiotracer uptake identified within both lobes of the thyroid gland.  No dominant  Thyroid U/S (10/26/2018): Stable, small nodules: Parenchymal Echotexture: Moderately heterogenous Isthmus: Normal in size measuring 0.4 cm in diameter Right lobe: Normal in size measuring 5.2 x 2.1 x 2.2  cm, unchanged previously, 4.1 x 1.7 x 1.9 cm Left lobe: Normal in size measuring 5.4 x 2.0 x 2.4 cm, unchanged, previously, 5.1 x 1.9 x 2.4 cm _________________________________________________________   Previously noted approximately 0.7 cm minimally complex cyst within the inferior pole the right lobe of the thyroid is not demonstrated on the present examination.  __________________________________________________   The previously noted approximately 1.5 x 1.3 x 1.1 cm isoechoic nodule within the mid, inferior aspect the left lobe of the thyroid is not seen on the present examination and thus may have represented a pseudonodule.   There is an approximately 1.0 x 1.0 x 0.8 cm isoechoic ill-defined nodule/pseudonodule within the mid aspect the left lobe of the thyroid (labeled 1) was not definitely seen on the 2017 examination however does not meet imaging criteria to recommend percutaneous sampling or continued dedicated follow-up.   There is an approximately 1.0 x 0.9 x 0.9 cm partially cystic, partially solid nodule within the inferior pole the left lobe of the thyroid (labeled 2), which was not definitely seen on the 2017 examination, however does not meet imaging criteria to recommend percutaneous sampling or continued dedicated follow-up.   IMPRESSION: 1. Findings suggestive of multinodular goiter. 2. No worrisome new or enlarging thyroid nodules. 3. None of the discretely measured thyroid nodules meet imaging criteria to recommend percutaneous sampling or continued dedicated follow-up.   She is on propranolol 120 mg in a.m. for tremors.  On lithium for bipolar disorder.  Pt denies: - feeling nodules in neck - hoarseness - dysphagia - choking - SOB with lying down  Pt does have a FH of thyroid ds on father's side of the family. No FH of thyroid cancer. No h/o radiation tx to head or neck. No herbal supplements. No Biotin use. No recent steroids use.   Pt. also has a  history of GERD, psoriasis, bipolar dis., HTN, B12 deficiency - on 1500 mcg daily.   B12 levels were normal at last check: Lab Results  Component Value Date   VITAMINB12 553 08/12/2020   VITAMINB12 675 08/16/2019   VITAMINB12 564 08/09/2018   VITAMINB12 1,157 (H) 10/05/2017   VITAMINB12 968 (H) 08/22/2016   VITAMINB12 >1500 (H) 08/21/2015   VITAMINB12 >1500 (H) 08/11/2014   VITAMINB12 189 (L) 07/26/2013   VITAMINB12 127 (L) 09/03/2012   + History of vaginal spotting-seeing OB/GYN (Dr. Milta Deiters).  On HRT.  She had right TKR in 09/2019 >> she felt  much better after the sx.  ROS: + see HPI  I reviewed pt's medications, allergies, PMH, social hx, family hx, and changes were documented in the history of present illness. Otherwise, unchanged from my initial visit note.  Past Medical History:  Diagnosis Date   Abnormal involuntary movements(781.0)    Acne    Allergic rhinitis  Arthritis    Asthma    Bipolar disorder, unspecified ([place])    psych - Dr. Lattie Haw   Common migraine with intractable migraine 10/09/2018   Depression    Diaphragmatic hernia without mention of obstruction or gangrene    GERD (gastroesophageal reflux disease)    03/18/02, EGD, [facility] Gerd,gastritis, dilated   History of MRI of brain and brain stem 06/01/03   [facility]   Hypertension    Other psoriasis    Tremor of both hands 10/09/2018   Unspecified sinusitis (chronic)    Past Surgical History:  Procedure Laterality Date   COLONOSCOPY  03/18/02   internal hemorrhoids   COLONOSCOPY  06/2014   diverticulosis, rpt 10 yrs ([place])   ESOPHAGOGASTRODUODENOSCOPY  1/99   [facility]; HH,GERD   ESOPHAGOGASTRODUODENOSCOPY  03/18/02   HH,GERD,Gastritis; Dilated   LAPAROSCOPIC ESOPHAGOGASTRIC FUNDOPLASTY  08/28/02   [name]   TOTAL HIP ARTHROPLASTY Right 10/04/2019   Procedure: RIGHT TOTAL HIP ARTHROPLASTY ANTERIOR APPROACH;  Surgeon: Mcarthur Rossetti, MD;  Location: [facility];  Service: Orthopedics;  Laterality: Right;    Social History   Social History   Marital status: Married    Spouse name: N/A   Number of children: 0   Occupational History   Housewife    Social History Main Topics   Smoking status: Former Smoker    Packs/day: 1.00    Years: 20.00    Types: Cigarettes    Quit date: 01/10/1998   Smokeless tobacco: Never Used   Alcohol use No   Drug use: No   Social History Narrative   Caffeine: drinks diet drinks   Lives with husband and 1 dog, 1 cat   Occupation: housewife   Activity: no regular exercise, doesn't feel safe to walk outside at home, could walk on mother's treadmill   Diet: good water, fruits/vegetables some    Current Outpatient Medications on File Prior to Visit  Medication Sig Dispense Refill   buPROPion (WELLBUTRIN XL) 150 MG 24 hr tablet Take 2 tablets (300 mg total) by mouth daily.     busPIRone (BUSPAR) 7.5 MG tablet Take 7.5 mg by mouth 3 (three) times daily.     Calcium-Magnesium-Zinc 333-133-5 MG TABS Take by mouth. Takes 1 tablet 3 times a week     Cholecalciferol (VITAMIN D3) 125 MCG (5000 UT) CAPS Take 5,000 Units by mouth daily.      citalopram (CELEXA) 20 MG tablet Take 1 tablet (20 mg total) by mouth daily.     Cyanocobalamin (VITAMELTS ENERGY VITAMIN B-12) 1500 MCG TBDP Take 1 tablet by mouth every Monday, Wednesday, and Friday.     estradiol (ESTRACE) 2 MG tablet Take 2 mg by mouth daily.     ferrous sulfate 324 (65 Fe) MG TBEC Take 1 tablet (325 mg total) by mouth 3 (three) times a week.     lithium carbonate (ESKALITH) 450 MG CR tablet Take 450 mg by mouth at bedtime.      losartan (COZAAR) 25 MG tablet Take 1 tablet (25 mg total) by mouth daily. 90 tablet 3   pantoprazole (PROTONIX) 40 MG tablet Take 1 tablet (40 mg total) by mouth daily. 90 tablet 3   progesterone (PROMETRIUM) 200 MG capsule Take 200 mg by mouth at bedtime.      propranolol ER (INDERAL LA) 120 MG 24 hr capsule Take 120 mg by mouth at bedtime.      tretinoin (RETIN-A) 0.05 % cream  Apply 1 application topically at bedtime.  triamcinolone (KENALOG) 0.1 % SMARTSIG:1 Application Topical 2-3 Times Daily     ziprasidone (GEODON) 80 MG capsule Take 240 mg by mouth at bedtime.     No current facility-administered medications on file prior to visit.   Allergies  Allergen Reactions   Amlodipine Swelling    Ankle swelling   Hydrocodone-Acetaminophen     Pt says it does not work for her   Minocycline Hcl Itching   Family History  Problem Relation Age of Onset   Arthritis Mother        fibromyalgia   Alcohol abuse Father    Emphysema Brother        smoker   COPD Brother        smoker, continues.   Arthritis Sister        fibromyalgia   Cancer Neg Hx    Coronary artery disease Neg Hx    Stroke Neg Hx    Diabetes Neg Hx    PE: BP 118/80 (BP Location: Right Arm, Patient Position: Sitting, Cuff Size: Normal)   Pulse (!) 51   Ht 5' 1.5" (1.562 m)   Wt 175 lb 12.8 oz (79.7 kg)   SpO2 98%   BMI 32.68 kg/m  Wt Readings from Last 3 Encounters:  06/11/21 175 lb 12.8 oz (79.7 kg)  08/21/20 166 lb 1 oz (75.3 kg)  06/11/20 163 lb 6.4 oz (74.1 kg)   Constitutional: overweight, in NAD Eyes: PERRLA, EOMI, no exophthalmos ENT: moist mucous membranes, no thyromegaly, no cervical lymphadenopathy Cardiovascular: RRR, No MRG Respiratory: CTA B Musculoskeletal: no deformities Skin: moist, warm, no rashes Neurological: +++ tremor with outstretched hands , DTR normal in all 4  ASSESSMENT: 1.  Graves' disease  2. Thyroid nodules  PLAN:  1. Patient with history of thyrotoxicosis diagnosed when she presented with thyrotoxic symptoms: Tremors (however, she has chronic tremors due to lithium), heat intolerance, palpitations, shortness of breath.  She started methimazole and we were able to decrease the dose gradually to 2.5 mg daily.  At last visit, she was on this dose.  However, at that time, since her TFTs were normal, we stopped methimazole completely.  TFTs were normal  1 mo later -At this visit, she still has tremors, which are chronic for her and also some heat intolerance, but otherwise she feels well.  Her pulse is low, blood pressure is normal.  She did not lose weight since last visit, but she actually gained 12 pounds. -Her TSI antibodies were still elevated at last check a year ago, but improved.  We will not repeat them today. -At today's visit we will repeat her TFTs and change the methimazole dose accordingly -She continues on beta-blocker for tremors, but we discussed that this should also help with tachycardia -I will see her back in 1 year  2. Thyroid nodules -No neck compression symptoms -She has a history of a 1 cm by cell-or slightly hypo-- echoic thyroid nodule, with internal blood flow on her ultrasound from 08/2015.  The nodule appeared warm on the uptake and scan, which can either indicate an inflammatory nodule (pseudonodule) or a normal functioning nodule.  Since the nodule was seen in the context of thyrotoxicosis, we decided to wait until she was euthyroid to repeat the ultrasound then.  When we repeated her ultrasound in 10/2018, this showed that 2 of her thyroid nodules were not visible anymore and 2 nodules were very small, not worrisome. -We will continue to follow her clinically, without the need for  further ultrasounds  Component     Latest Ref Rng 06/11/2021  Triiodothyronine,Free,Serum     2.3 - 4.2 pg/mL 3.5   T4,Free(Direct)     0.60 - 1.60 ng/dL 0.58 (L)   TSH     0.35 - 5.50 uIU/mL 0.16 (L)     TSH is slightly low.  My suggestion would be to start a very low-dose of methimazole, half a tablet daily (2.5 mg daily) and repeat her test in 1.5 months.  Philemon Kingdom, MD PhD [facility] End[facility]

## 2021-07-08 ENCOUNTER — Ambulatory Visit (INDEPENDENT_AMBULATORY_CARE_PROVIDER_SITE_OTHER): Payer: 59 | Admitting: Family Medicine

## 2021-07-08 ENCOUNTER — Encounter: Payer: Self-pay | Admitting: Family Medicine

## 2021-07-08 VITALS — BP 120/80 | HR 61 | Temp 98.3°F | Ht 61.5 in | Wt 177.2 lb

## 2021-07-08 DIAGNOSIS — J3489 Other specified disorders of nose and nasal sinuses: Secondary | ICD-10-CM | POA: Diagnosis not present

## 2021-07-08 DIAGNOSIS — G4459 Other complicated headache syndrome: Secondary | ICD-10-CM | POA: Diagnosis not present

## 2021-07-08 MED ORDER — AMOXICILLIN 875 MG PO TABS: 875.0000 mg | ORAL_TABLET | Freq: Two times a day (BID) | ORAL | 0 refills | Status: DC

## 2021-07-08 NOTE — Patient Instructions (Signed)
  Ask someone at the pharmacy, and they can help you find them:  Start using your Fluticasone (generic flonase) - 2 sprays in each nostril each day.  Over the counter Loratidine (generic Claritin) 10 mg, once a day

## 2021-07-08 NOTE — Progress Notes (Signed)
Shefali Ng T. Sundi Slevin, MD, [facility] [place], [address]  Phone: [phone]  FAX: [place] - 61 y.o. female  MRN [id number]  Date of Birth: [birth year]  Date: 07/08/2021  PCP: Ria Bush, MD  Referral: Ria Bush, MD  Chief Complaint  Patient presents with   Migraine   Nasal Congestion        Subjective:   Katelyn Price is a 61 y.o. very pleasant female patient with Body mass index is 32.94 kg/m. who presents with the following:  Feeling a little bit of drainage down her throat.    Hurting on the top of her head.  Some pain around her face. Has been taking some  She is having some pain in the of the upper teeth. Her throat is also sore.  She has been sick or at least feeling bad now for roughly 1 week or so.  She did put an ice pack on her head, and that seemed to help with her symptoms.  No photophobia, no phonophobia.  Review of Systems is noted in the HPI, as appropriate  Objective:   BP 120/80   Pulse 61   Temp 98.3 F (36.8 C) (Oral)   Ht 5' 1.5" (1.562 m)   Wt 177 lb 3 oz (80.4 kg)   SpO2 97%   BMI 32.94 kg/m    Gen: WDWN, NAD; alert,appropriate and cooperative throughout exam  HEENT: Normocephalic and atraumatic. Throat clear, w/o exudate, no LAD, R TM clear, L TM - good landmarks, No fluid present. rhinnorhea.  Left frontal and maxillary sinuses: Tender Right frontal and maxillary sinuses: Tender  Neck: No ant or post LAD CV: RRR, No M/G/R Pulm: Breathing comfortably in no resp distress. no w/c/r Psych: full affect, pleasant   Laboratory and Imaging Data:  Assessment and Plan:     ICD-10-CM   1. Frontal sinus pain  J34.89     2. Other complicated headache syndrome  G44.59      Frontal sinus headache pain with pain for 1 week.  Think this most likely relates from an acute illness, and i am gonna placed on amoxicillin twice  daily for presumed sinusitis.  She also has some headaches in general, but believes now it does not sound to be migrainous.  Tylenol as needed.  Avoid NSAIDs in a patient on lithium.  Medication Management during today's office visit: Meds ordered this encounter  Medications   amoxicillin (AMOXIL) 875 MG tablet    Sig: Take 1 tablet (875 mg total) by mouth 2 (two) times daily.    Dispense:  20 tablet    Refill:  0   There are no discontinued medications.  Orders placed today for conditions managed today: No orders of the defined types were placed in this encounter.   Follow-up if needed: No follow-ups on file.  Dragon [employer] One speech-to-text software was used for transcription in this dictation.  Possible transcriptional errors can occur using Editor, commissioning.   Signed,  Maud Deed. Rumaysa Sabatino, MD   Outpatient Encounter Medications as of 07/08/2021  Medication Sig   amoxicillin (AMOXIL) 875 MG tablet Take 1 tablet (875 mg total) by mouth 2 (two) times daily.   buPROPion (WELLBUTRIN XL) 150 MG 24 hr tablet Take 2 tablets (300 mg total) by mouth daily.   busPIRone (BUSPAR) 7.5 MG tablet Take 7.5 mg by mouth 3 (three) times daily.  Calcium-Magnesium-Zinc 333-133-5 MG TABS Take by mouth. Takes 1 tablet 3 times a week   Cholecalciferol (VITAMIN D3) 125 MCG (5000 UT) CAPS Take 5,000 Units by mouth daily.    citalopram (CELEXA) 20 MG tablet Take 1 tablet (20 mg total) by mouth daily.   Cyanocobalamin (VITAMELTS ENERGY VITAMIN B-12) 1500 MCG TBDP Take 1 tablet by mouth every Monday, Wednesday, and Friday.   estradiol (ESTRACE) 2 MG tablet Take 2 mg by mouth daily.   ferrous sulfate 324 (65 Fe) MG TBEC Take 1 tablet (325 mg total) by mouth 3 (three) times a week.   lithium carbonate (ESKALITH) 450 MG CR tablet Take 450 mg by mouth at bedtime.    losartan (COZAAR) 25 MG tablet Take 1 tablet (25 mg total) by mouth daily.   methimazole (TAPAZOLE) 5 MG tablet Take 0.5 tablets (2.5 mg total)  by mouth daily.   pantoprazole (PROTONIX) 40 MG tablet Take 1 tablet (40 mg total) by mouth daily.   progesterone (PROMETRIUM) 200 MG capsule Take 200 mg by mouth at bedtime.    propranolol ER (INDERAL LA) 120 MG 24 hr capsule Take 120 mg by mouth at bedtime.    tretinoin (RETIN-A) 0.05 % cream Apply 1 application topically at bedtime.   triamcinolone (KENALOG) 0.1 % SMARTSIG:1 Application Topical 2-3 Times Daily   ziprasidone (GEODON) 80 MG capsule Take 240 mg by mouth at bedtime.   No facility-administered encounter medications on file as of 07/08/2021.

## 2021-07-23 ENCOUNTER — Other Ambulatory Visit (INDEPENDENT_AMBULATORY_CARE_PROVIDER_SITE_OTHER): Payer: 59

## 2021-07-23 DIAGNOSIS — E0500 Thyrotoxicosis with diffuse goiter without thyrotoxic crisis or storm: Secondary | ICD-10-CM | POA: Diagnosis not present

## 2021-07-23 LAB — TSH: TSH: 0.14 u[IU]/mL — ABNORMAL LOW (ref 0.35–5.50)

## 2021-07-23 LAB — T3, FREE: T3, Free: 2.7 pg/mL (ref 2.3–4.2)

## 2021-07-23 LAB — T4, FREE: Free T4: 0.55 ng/dL — ABNORMAL LOW (ref 0.60–1.60)

## 2021-09-22 ENCOUNTER — Other Ambulatory Visit (INDEPENDENT_AMBULATORY_CARE_PROVIDER_SITE_OTHER): Payer: 59

## 2021-09-22 DIAGNOSIS — E0500 Thyrotoxicosis with diffuse goiter without thyrotoxic crisis or storm: Secondary | ICD-10-CM

## 2021-09-22 LAB — TSH: TSH: 0.76 u[IU]/mL (ref 0.35–5.50)

## 2021-09-22 LAB — T4, FREE: Free T4: 0.45 ng/dL — ABNORMAL LOW (ref 0.60–1.60)

## 2021-09-22 LAB — T3, FREE: T3, Free: 3 pg/mL (ref 2.3–4.2)

## 2021-09-24 LAB — HM PAP SMEAR: HPV, high-risk: NEGATIVE

## 2021-09-27 ENCOUNTER — Other Ambulatory Visit: Payer: Self-pay | Admitting: Obstetrics and Gynecology

## 2021-09-27 DIAGNOSIS — R928 Other abnormal and inconclusive findings on diagnostic imaging of breast: Secondary | ICD-10-CM

## 2021-10-04 ENCOUNTER — Other Ambulatory Visit: Payer: Self-pay | Admitting: Family Medicine

## 2021-10-04 DIAGNOSIS — I10 Essential (primary) hypertension: Secondary | ICD-10-CM

## 2021-10-11 ENCOUNTER — Ambulatory Visit
Admission: RE | Admit: 2021-10-11 | Discharge: 2021-10-11 | Disposition: A | Discharge status: Home / Self Care | Payer: 59 | Source: Ambulatory Visit | Attending: Obstetrics and Gynecology | Admitting: Obstetrics and Gynecology

## 2021-10-11 DIAGNOSIS — R928 Other abnormal and inconclusive findings on diagnostic imaging of breast: Secondary | ICD-10-CM

## 2021-10-18 ENCOUNTER — Other Ambulatory Visit: Payer: Self-pay | Admitting: Family Medicine

## 2021-11-02 ENCOUNTER — Other Ambulatory Visit: Payer: Self-pay | Admitting: Family Medicine

## 2021-11-02 DIAGNOSIS — E0500 Thyrotoxicosis with diffuse goiter without thyrotoxic crisis or storm: Secondary | ICD-10-CM

## 2021-11-02 DIAGNOSIS — E538 Deficiency of other specified B group vitamins: Secondary | ICD-10-CM

## 2021-11-02 DIAGNOSIS — N289 Disorder of kidney and ureter, unspecified: Secondary | ICD-10-CM

## 2021-11-02 DIAGNOSIS — F3170 Bipolar disorder, currently in remission, most recent episode unspecified: Secondary | ICD-10-CM

## 2021-11-04 ENCOUNTER — Other Ambulatory Visit (INDEPENDENT_AMBULATORY_CARE_PROVIDER_SITE_OTHER): Payer: 59

## 2021-11-04 DIAGNOSIS — F3170 Bipolar disorder, currently in remission, most recent episode unspecified: Secondary | ICD-10-CM | POA: Diagnosis not present

## 2021-11-04 DIAGNOSIS — N289 Disorder of kidney and ureter, unspecified: Secondary | ICD-10-CM

## 2021-11-04 DIAGNOSIS — E0500 Thyrotoxicosis with diffuse goiter without thyrotoxic crisis or storm: Secondary | ICD-10-CM

## 2021-11-04 DIAGNOSIS — E538 Deficiency of other specified B group vitamins: Secondary | ICD-10-CM | POA: Diagnosis not present

## 2021-11-04 LAB — CBC WITH DIFFERENTIAL/PLATELET
Basophils Absolute: 0.1 10*3/uL (ref 0.0–0.1)
Basophils Relative: 0.8 % (ref 0.0–3.0)
Eosinophils Absolute: 0.3 10*3/uL (ref 0.0–0.7)
Eosinophils Relative: 3.4 % (ref 0.0–5.0)
HCT: 39.4 % (ref 36.0–46.0)
Hemoglobin: 12.9 g/dL (ref 12.0–15.0)
Lymphocytes Relative: 24.5 % (ref 12.0–46.0)
Lymphs Abs: 2.2 10*3/uL (ref 0.7–4.0)
MCHC: 32.8 g/dL (ref 30.0–36.0)
MCV: 97.6 fl (ref 78.0–100.0)
Monocytes Absolute: 0.8 10*3/uL (ref 0.1–1.0)
Monocytes Relative: 9.2 % (ref 3.0–12.0)
Neutro Abs: 5.7 10*3/uL (ref 1.4–7.7)
Neutrophils Relative %: 62.1 % (ref 43.0–77.0)
Platelets: 273 10*3/uL (ref 150.0–400.0)
RBC: 4.04 Mil/uL (ref 3.87–5.11)
RDW: 13.7 % (ref 11.5–15.5)
WBC: 9.1 10*3/uL (ref 4.0–10.5)

## 2021-11-04 LAB — T4, FREE: Free T4: 0.48 ng/dL — ABNORMAL LOW (ref 0.60–1.60)

## 2021-11-04 LAB — VITAMIN D 25 HYDROXY (VIT D DEFICIENCY, FRACTURES): VITD: 82.82 ng/mL (ref 30.00–100.00)

## 2021-11-04 LAB — COMPREHENSIVE METABOLIC PANEL
ALT: 10 U/L (ref 0–35)
AST: 12 U/L (ref 0–37)
Albumin: 3.9 g/dL (ref 3.5–5.2)
Alkaline Phosphatase: 49 U/L (ref 39–117)
BUN: 20 mg/dL (ref 6–23)
CO2: 28 mEq/L (ref 19–32)
Calcium: 10.6 mg/dL — ABNORMAL HIGH (ref 8.4–10.5)
Chloride: 100 mEq/L (ref 96–112)
Creatinine, Ser: 1.02 mg/dL (ref 0.40–1.20)
GFR: 59.69 mL/min — ABNORMAL LOW (ref >60.00)
Glucose, Bld: 99 mg/dL (ref 70–99)
Potassium: 5.3 mEq/L — ABNORMAL HIGH (ref 3.5–5.1)
Sodium: 133 mEq/L — ABNORMAL LOW (ref 135–145)
Total Bilirubin: 0.5 mg/dL (ref 0.2–1.2)
Total Protein: 6.4 g/dL (ref 6.0–8.3)

## 2021-11-04 LAB — LIPID PANEL
Cholesterol: 201 mg/dL — ABNORMAL HIGH (ref 0–200)
HDL: 82.2 mg/dL (ref >39.00)
LDL Cholesterol: 92 mg/dL (ref 0–99)
NonHDL: 118.5
Total CHOL/HDL Ratio: 2
Triglycerides: 135 mg/dL (ref 0.0–149.0)
VLDL: 27 mg/dL (ref 0.0–40.0)

## 2021-11-04 LAB — TSH: TSH: 1.19 u[IU]/mL (ref 0.35–5.50)

## 2021-11-04 LAB — VITAMIN B12: Vitamin B-12: 1004 pg/mL — ABNORMAL HIGH (ref 211–911)

## 2021-11-05 LAB — LITHIUM LEVEL: Lithium Lvl: 1 mmol/L (ref 0.6–1.2)

## 2021-11-08 ENCOUNTER — Ambulatory Visit (INDEPENDENT_AMBULATORY_CARE_PROVIDER_SITE_OTHER): Payer: 59 | Admitting: Family Medicine

## 2021-11-08 ENCOUNTER — Encounter: Payer: Self-pay | Admitting: Family Medicine

## 2021-11-08 VITALS — BP 120/82 | HR 58 | Temp 98.0°F | Ht 61.5 in | Wt 182.2 lb

## 2021-11-08 DIAGNOSIS — Z23 Encounter for immunization: Secondary | ICD-10-CM | POA: Diagnosis not present

## 2021-11-08 DIAGNOSIS — Z7189 Other specified counseling: Secondary | ICD-10-CM | POA: Insufficient documentation

## 2021-11-08 DIAGNOSIS — N289 Disorder of kidney and ureter, unspecified: Secondary | ICD-10-CM

## 2021-11-08 DIAGNOSIS — E538 Deficiency of other specified B group vitamins: Secondary | ICD-10-CM

## 2021-11-08 DIAGNOSIS — K219 Gastro-esophageal reflux disease without esophagitis: Secondary | ICD-10-CM

## 2021-11-08 DIAGNOSIS — E871 Hypo-osmolality and hyponatremia: Secondary | ICD-10-CM

## 2021-11-08 DIAGNOSIS — Z0000 Encounter for general adult medical examination without abnormal findings: Secondary | ICD-10-CM

## 2021-11-08 DIAGNOSIS — G251 Drug-induced tremor: Secondary | ICD-10-CM

## 2021-11-08 DIAGNOSIS — E0500 Thyrotoxicosis with diffuse goiter without thyrotoxic crisis or storm: Secondary | ICD-10-CM

## 2021-11-08 DIAGNOSIS — F3170 Bipolar disorder, currently in remission, most recent episode unspecified: Secondary | ICD-10-CM | POA: Diagnosis not present

## 2021-11-08 DIAGNOSIS — I10 Essential (primary) hypertension: Secondary | ICD-10-CM

## 2021-11-08 MED ORDER — LISINOPRIL 5 MG PO TABS: 5.0000 mg | ORAL_TABLET | Freq: Every day | ORAL | 3 refills | Status: DC

## 2021-11-08 NOTE — Assessment & Plan Note (Signed)
Advanced directive - discussed. Doesn't have this. Would want husband to be HCPOA. Full code. Wouldn't want prolonged life support if terminal condition. Packet provided today.

## 2021-11-08 NOTE — Assessment & Plan Note (Signed)
Regularly sees psych Josephine Igo NP. Continue current regimen.

## 2021-11-08 NOTE — Assessment & Plan Note (Signed)
Appreciate endocrinologist care.  Now back on low dose methimazole

## 2021-11-08 NOTE — Assessment & Plan Note (Signed)
Continues daily PPI.  

## 2021-11-08 NOTE — Assessment & Plan Note (Signed)
Neurology recommended stopping lithium. Psychiatry didn't recommend stopping this. She continues lithium with ongoing tremor.  She also continues propranolol

## 2021-11-08 NOTE — Assessment & Plan Note (Signed)
Stable period on MWF replacement.

## 2021-11-08 NOTE — Assessment & Plan Note (Signed)
Preventative protocols reviewed and updated unless pt declined. Discussed healthy diet and lifestyle.  

## 2021-11-08 NOTE — Progress Notes (Signed)
Patient ID: Katelyn Price, female    DOB: 1960-09-23, 61 y.o.   MRN: [id number]  This visit was conducted in person.  BP 120/82   Pulse (!) 58   Temp 98 F (36.7 C) (Temporal)   Ht 5' 1.5" (1.562 m)   Wt 182 lb 4 oz (82.7 kg)   SpO2 98%   BMI 33.88 kg/m    CC: CPE Subjective:   HPI: Katelyn Price is a 61 y.o. female presenting on 11/08/2021 for Annual Exam   S/p R hip replacement 09/2019 Ninfa Linden) - recovered well.    Graves disease managed by endo Cruzita Lederer), h/o thyroid nodules. She is now back on low dose methimazole.    Tremors - saw Dr Jannifer Franklin who thought tremor coming from lithium - advised taper off. Started on topamax for migraines - but actually worsened headaches so now off this.    Bipolar - followed by psychiatry NP Noemi Chapel on lithium, celexa, buspar and wellbutrin. Last seen 03/2021.   GERD - continues daily PPI. Has been unable to wean down.    Preventative: COLONOSCOPY 06/2014 diverticulosis, rpt 10 yrs Collene Mares)  Well woman with [facility] Dr. Nori Riis (retired), new doc Dr Orvan Seen 03/2021. Continues estrace '2mg'$  daily planned taper off this, continues progesterone one week per month.  Mammogram through GYN - needed dx mammo/[facility]  DEXA - discussed, she would be eligible, she defers for now Lung cancer screening - not eligible  Flu shot yearly COVID vaccine - J&J 05/2019. No boosters.  Tdap - 06/2011, will rpt Shingrix - 08/2019, 11/2019.  Advanced directive - discussed. Doesn't have this. Would want husband to be HCPOA. Full code. Wouldn't want prolonged life support if terminal condition. Packet provided today.  Seat belt use discussed  Sunscreen use discussed, no changing moles on skin  Ex smoker - quit 2007. Husband smokes pipe and e-cig Alcohol - none  Dentist yearly Eye exam - yearly  Bowel - no constipation - takes bowel regimen with miralax regularly  Bladder - new incontinence - episodes of complete  incontinence x2  Caffeine: drinks diet drinks   Lives with husband and 1 dog, 1 cat   Occupation: housewife  Activity: no regular exercise  Diet: good water, fruits/vegetables some      Relevant past medical, surgical, family and social history reviewed and updated as indicated. Interim medical history since our last visit reviewed. Allergies and medications reviewed and updated. Outpatient Medications Prior to Visit  Medication Sig Dispense Refill   buPROPion (WELLBUTRIN XL) 150 MG 24 hr tablet Take 2 tablets (300 mg total) by mouth daily.     busPIRone (BUSPAR) 7.5 MG tablet Take 7.5 mg by mouth 3 (three) times daily.     Calcium-Magnesium-Zinc 333-133-5 MG TABS Take by mouth. Takes 1 tablet 3 times a week     Cholecalciferol (VITAMIN D3) 125 MCG (5000 UT) CAPS Take 5,000 Units by mouth daily.      citalopram (CELEXA) 20 MG tablet Take 1 tablet (20 mg total) by mouth daily.     Cyanocobalamin (VITAMELTS ENERGY VITAMIN B-12) 1500 MCG TBDP Take 1 tablet by mouth every Monday, Wednesday, and Friday.     estradiol (ESTRACE) 2 MG tablet Take 2 mg by mouth daily.     ferrous sulfate 324 (65 Fe) MG TBEC Take 1 tablet (325 mg total) by mouth 3 (three) times a week.     lithium carbonate (ESKALITH) 450  MG CR tablet Take 450 mg by mouth at bedtime.      methimazole (TAPAZOLE) 5 MG tablet Take 0.5 tablets (2.5 mg total) by mouth daily. 45 tablet 3   pantoprazole (PROTONIX) 40 MG tablet TAKE 1 TABLET BY MOUTH EVERY DAY 90 tablet 0   progesterone (PROMETRIUM) 200 MG capsule Take 200 mg by mouth at bedtime.      propranolol ER (INDERAL LA) 120 MG 24 hr capsule Take 120 mg by mouth at bedtime.      tretinoin (RETIN-A) 0.05 % cream Apply 1 application topically at bedtime.     triamcinolone (KENALOG) 0.1 % SMARTSIG:1 Application Topical 2-3 Times Daily     ziprasidone (GEODON) 80 MG capsule Take 240 mg by mouth at bedtime.     amoxicillin (AMOXIL) 875 MG tablet Take 1 tablet (875 mg total) by mouth 2  (two) times daily. 20 tablet 0   losartan (COZAAR) 25 MG tablet TAKE 1 TABLET (25 MG TOTAL) BY MOUTH DAILY. 90 tablet 0   No facility-administered medications prior to visit.     Per HPI unless specifically indicated in ROS section below Review of Systems  Constitutional:  Negative for activity change, appetite change, chills, fatigue, fever and unexpected weight change.  HENT:  Negative for hearing loss.   Eyes:  Negative for visual disturbance.  Respiratory:  Positive for cough (congestion) and shortness of breath. Negative for chest tightness and wheezing.   Cardiovascular:  Negative for chest pain, palpitations and leg swelling.  Gastrointestinal:  Negative for abdominal distention, abdominal pain, blood in stool, constipation, diarrhea, nausea and vomiting.  Genitourinary:  Negative for difficulty urinating and hematuria.  Musculoskeletal:  Negative for arthralgias, myalgias and neck pain.  Skin:  Negative for rash.  Neurological:  Positive for tremors. Negative for dizziness, seizures, syncope and headaches.  Hematological:  Negative for adenopathy. Does not bruise/bleed easily.  Psychiatric/Behavioral:  Positive for dysphoric mood. The patient is nervous/anxious.     Objective:  BP 120/82   Pulse (!) 58   Temp 98 F (36.7 C) (Temporal)   Ht 5' 1.5" (1.562 m)   Wt 182 lb 4 oz (82.7 kg)   SpO2 98%   BMI 33.88 kg/m   Wt Readings from Last 3 Encounters:  11/08/21 182 lb 4 oz (82.7 kg)  07/08/21 177 lb 3 oz (80.4 kg)  06/11/21 175 lb 12.8 oz (79.7 kg)      Physical Exam Vitals and nursing note reviewed.  Constitutional:      Appearance: Normal appearance. She is not ill-appearing.  HENT:     Head: Normocephalic and atraumatic.     Right Ear: Tympanic membrane, ear canal and external ear normal. There is no impacted cerumen.     Left Ear: Tympanic membrane, ear canal and external ear normal. There is no impacted cerumen.  Eyes:     General:        Right eye: No  discharge.        Left eye: No discharge.     Extraocular Movements: Extraocular movements intact.     Conjunctiva/sclera: Conjunctivae normal.     Pupils: Pupils are equal, round, and reactive to light.  Neck:     Thyroid: No thyroid mass or thyromegaly.  Cardiovascular:     Rate and Rhythm: Normal rate and regular rhythm.     Pulses: Normal pulses.     Heart sounds: Normal heart sounds. No murmur heard. Pulmonary:     Effort: Pulmonary effort is normal.  No respiratory distress.     Breath sounds: Normal breath sounds. No wheezing, rhonchi or rales.  Abdominal:     General: Bowel sounds are normal. There is no distension.     Palpations: Abdomen is soft. There is no mass.     Tenderness: There is no abdominal tenderness. There is no guarding or rebound.     Hernia: No hernia is present.  Musculoskeletal:     Cervical back: Normal range of motion and neck supple. No rigidity.     Right lower leg: No edema.     Left lower leg: No edema.  Lymphadenopathy:     Cervical: No cervical adenopathy.  Skin:    General: Skin is warm and dry.     Findings: No rash.  Neurological:     General: No focal deficit present.     Mental Status: She is alert. Mental status is at baseline.  Psychiatric:        Mood and Affect: Mood normal.        Behavior: Behavior normal.       Results for orders placed or performed in visit on 11/04/21  T4, free  Result Value Ref Range   Free T4 0.48 (L) 0.60 - 1.60 ng/dL  Lithium level  Result Value Ref Range   Lithium Lvl 1.0 0.6 - 1.2 mmol/L  CBC with Differential/Platelet  Result Value Ref Range   WBC 9.1 4.0 - 10.5 K/uL   RBC 4.04 3.87 - 5.11 Mil/uL   Hemoglobin 12.9 12.0 - 15.0 g/dL   HCT 39.4 36.0 - 46.0 %   MCV 97.6 78.0 - 100.0 fl   MCHC 32.8 30.0 - 36.0 g/dL   RDW 13.7 11.5 - 15.5 %   Platelets 273.0 150.0 - 400.0 K/uL   Neutrophils Relative % 62.1 43.0 - 77.0 %   Lymphocytes Relative 24.5 12.0 - 46.0 %   Monocytes Relative 9.2 3.0 -  12.0 %   Eosinophils Relative 3.4 0.0 - 5.0 %   Basophils Relative 0.8 0.0 - 3.0 %   Neutro Abs 5.7 1.4 - 7.7 K/uL   Lymphs Abs 2.2 0.7 - 4.0 K/uL   Monocytes Absolute 0.8 0.1 - 1.0 K/uL   Eosinophils Absolute 0.3 0.0 - 0.7 K/uL   Basophils Absolute 0.1 0.0 - 0.1 K/uL  TSH  Result Value Ref Range   TSH 1.19 0.35 - 5.50 uIU/mL  Comprehensive metabolic panel  Result Value Ref Range   Sodium 133 (L) 135 - 145 mEq/L   Potassium 5.3 No hemolysis seen (H) 3.5 - 5.1 mEq/L   Chloride 100 96 - 112 mEq/L   CO2 28 19 - 32 mEq/L   Glucose, Bld 99 70 - 99 mg/dL   BUN 20 6 - 23 mg/dL   Creatinine, Ser 1.02 0.40 - 1.20 mg/dL   Total Bilirubin 0.5 0.2 - 1.2 mg/dL   Alkaline Phosphatase 49 39 - 117 U/L   AST 12 0 - 37 U/L   ALT 10 0 - 35 U/L   Total Protein 6.4 6.0 - 8.3 g/dL   Albumin 3.9 3.5 - 5.2 g/dL   GFR 59.69 (L) >60.00 mL/min   Calcium 10.6 (H) 8.4 - 10.5 mg/dL  Lipid panel  Result Value Ref Range   Cholesterol 201 (H) 0 - 200 mg/dL   Triglycerides 135.0 0.0 - 149.0 mg/dL   HDL 82.20 >39.00 mg/dL   VLDL 27.0 0.0 - 40.0 mg/dL   LDL Cholesterol 92 0 - 99 mg/dL  Total CHOL/HDL Ratio 2    NonHDL 118.50   VITAMIN D 25 Hydroxy (Vit-D Deficiency, Fractures)  Result Value Ref Range   VITD 82.82 30.00 - 100.00 ng/mL  Vitamin B12  Result Value Ref Range   Vitamin B-12 1,004 (H) 211 - 911 pg/mL    Assessment & Plan:   Problem List Items Addressed This Visit     Health maintenance examination - Primary (Chronic)    Preventative protocols reviewed and updated unless pt declined. Discussed healthy diet and lifestyle.       Advanced directives, counseling/discussion (Chronic)    Advanced directive - discussed. Doesn't have this. Would want husband to be HCPOA. Full code. Wouldn't want prolonged life support if terminal condition. Packet provided today.       Bipolar disorder [facility])    Regularly sees psych Josephine Igo NP. Continue current regimen.       HYPERTENSION, BENIGN  ESSENTIAL    Chronic, stable. Hyperkalemia persists on losartan '25mg'$  daily - will change to lisinopril '5mg'$  daily.       Relevant Medications   lisinopril (ZESTRIL) 5 MG tablet   GERD (gastroesophageal reflux disease)    Continues daily PPI       Drug-induced tremor    Neurology recommended stopping lithium. Psychiatry didn't recommend stopping this. She continues lithium with ongoing tremor.  She also continues propranolol       Renal insufficiency    Chronic, mild impairment. Will continue to monitor this.       Vitamin B12 deficiency    Stable period on MWF replacement.      Graves disease    Appreciate endocrinologist care.  Now back on low dose methimazole       Hyponatremia    Mild, overall stable.      Other Visit Diagnoses     Need for influenza vaccination       Relevant Orders   Flu Vaccine QUAD 77moIM (Fluarix, Fluzone & Alfiuria Quad PF) (Completed)        Meds ordered this encounter  Medications   lisinopril (ZESTRIL) 5 MG tablet    Sig: Take 1 tablet (5 mg total) by mouth daily.    Dispense:  90 tablet    Refill:  3   Orders Placed This Encounter  Procedures   Flu Vaccine QUAD 6105moM (Fluarix, Fluzone & Alfiuria Quad PF)    Patient instructions: Flu shot today  Look into setting up a living will, packet provided today.  Continue current medicines, but change losartan '25mg'$  daily to lisinopril '5mg'$  daily for blood pressure. New lisinopril dose sent to pharmacy.  Return as needed or in 6 months for follow up visit.   Follow up plan: Return in about 6 months (around 05/10/2022) for follow up visit.  JaRia BushMD

## 2021-11-08 NOTE — Assessment & Plan Note (Signed)
Chronic, stable. Hyperkalemia persists on losartan '25mg'$  daily - will change to lisinopril '5mg'$  daily.

## 2021-11-08 NOTE — Patient Instructions (Addendum)
Flu shot today  Look into setting up a living will, packet provided today.  Continue current medicines, but change losartan '25mg'$  daily to lisinopril '5mg'$  daily for blood pressure. New lisinopril dose sent to pharmacy.  Return as needed or in 6 months for follow up visit.   Health Maintenance for Postmenopausal Women Menopause is a normal process in which your ability to get pregnant comes to an end. This process happens slowly over many months or years, usually between the ages of 27 and 54. Menopause is complete when you have missed your menstrual period for 12 months. It is important to talk with your health care provider about some of the most common conditions that affect women after menopause (postmenopausal women). These include heart disease, cancer, and bone loss (osteoporosis). Adopting a healthy lifestyle and getting preventive care can help to promote your health and wellness. The actions you take can also lower your chances of developing some of these common conditions. What are the signs and symptoms of menopause? During menopause, you may have the following symptoms: Hot flashes. These can be moderate or severe. Night sweats. Decrease in sex drive. Mood swings. Headaches. Tiredness (fatigue). Irritability. Memory problems. Problems falling asleep or staying asleep. Talk with your health care provider about treatment options for your symptoms. Do I need hormone replacement therapy? Hormone replacement therapy is effective in treating symptoms that are caused by menopause, such as hot flashes and night sweats. Hormone replacement carries certain risks, especially as you become older. If you are thinking about using estrogen or estrogen with progestin, discuss the benefits and risks with your health care provider. How can I reduce my risk for heart disease and stroke? The risk of heart disease, heart attack, and stroke increases as you age. One of the causes may be a change in the  body's hormones during menopause. This can affect how your body uses dietary fats, triglycerides, and cholesterol. Heart attack and stroke are medical emergencies. There are many things that you can do to help prevent heart disease and stroke. Watch your blood pressure High blood pressure causes heart disease and increases the risk of stroke. This is more likely to develop in people who have high blood pressure readings or are overweight. Have your blood pressure checked: Every 3-5 years if you are 58-24 years of age. Every year if you are 51 years old or older. Eat a healthy diet  Eat a diet that includes plenty of vegetables, fruits, low-fat dairy products, and lean protein. Do not eat a lot of foods that are high in solid fats, added sugars, or sodium. Get regular exercise Get regular exercise. This is one of the most important things you can do for your health. Most adults should: Try to exercise for at least 150 minutes each week. The exercise should increase your heart rate and make you sweat (moderate-intensity exercise). Try to do strengthening exercises at least twice each week. Do these in addition to the moderate-intensity exercise. Spend less time sitting. Even light physical activity can be beneficial. Other tips Work with your health care provider to achieve or maintain a healthy weight. Do not use any products that contain nicotine or tobacco. These products include cigarettes, chewing tobacco, and vaping devices, such as e-cigarettes. If you need help quitting, ask your health care provider. Know your numbers. Ask your health care provider to check your cholesterol and your blood sugar (glucose). Continue to have your blood tested as directed by your health care provider. Do  I need screening for cancer? Depending on your health history and family history, you may need to have cancer screenings at different stages of your life. This may include screening for: Breast  cancer. Cervical cancer. Lung cancer. Colorectal cancer. What is my risk for osteoporosis? After menopause, you may be at increased risk for osteoporosis. Osteoporosis is a condition in which bone destruction happens more quickly than new bone creation. To help prevent osteoporosis or the bone fractures that can happen because of osteoporosis, you may take the following actions: If you are 44-74 years old, get at least 1,000 mg of calcium and at least 600 international units (IU) of vitamin D per day. If you are older than age 81 but younger than age 34, get at least 1,200 mg of calcium and at least 600 international units (IU) of vitamin D per day. If you are older than age 80, get at least 1,200 mg of calcium and at least 800 international units (IU) of vitamin D per day. Smoking and drinking excessive alcohol increase the risk of osteoporosis. Eat foods that are rich in calcium and vitamin D, and do weight-bearing exercises several times each week as directed by your health care provider. How does menopause affect my mental health? Depression may occur at any age, but it is more common as you become older. Common symptoms of depression include: Feeling depressed. Changes in sleep patterns. Changes in appetite or eating patterns. Feeling an overall lack of motivation or enjoyment of activities that you previously enjoyed. Frequent crying spells. Talk with your health care provider if you think that you are experiencing any of these symptoms. General instructions See your health care provider for regular wellness exams and vaccines. This may include: Scheduling regular health, dental, and eye exams. Getting and maintaining your vaccines. These include: Influenza vaccine. Get this vaccine each year before the flu season begins. Pneumonia vaccine. Shingles vaccine. Tetanus, diphtheria, and pertussis (Tdap) booster vaccine. Your health care provider may also recommend other  immunizations. Tell your health care provider if you have ever been abused or do not feel safe at home. Summary Menopause is a normal process in which your ability to get pregnant comes to an end. This condition causes hot flashes, night sweats, decreased interest in sex, mood swings, headaches, or lack of sleep. Treatment for this condition may include hormone replacement therapy. Take actions to keep yourself healthy, including exercising regularly, eating a healthy diet, watching your weight, and checking your blood pressure and blood sugar levels. Get screened for cancer and depression. Make sure that you are up to date with all your vaccines. This information is not intended to replace advice given to you by your health care provider. Make sure you discuss any questions you have with your health care provider. Document Revised: 05/18/2020 Document Reviewed: 05/18/2020 Elsevier Patient Education  [place].

## 2021-11-08 NOTE — Assessment & Plan Note (Signed)
Chronic, mild impairment. Will continue to monitor this.

## 2021-11-08 NOTE — Assessment & Plan Note (Signed)
Mild, overall stable.

## 2021-12-23 ENCOUNTER — Other Ambulatory Visit (INDEPENDENT_AMBULATORY_CARE_PROVIDER_SITE_OTHER): Payer: 59

## 2021-12-23 DIAGNOSIS — E0500 Thyrotoxicosis with diffuse goiter without thyrotoxic crisis or storm: Secondary | ICD-10-CM | POA: Diagnosis not present

## 2021-12-23 LAB — T3, FREE: T3, Free: 3.1 pg/mL (ref 2.3–4.2)

## 2021-12-23 LAB — TSH: TSH: 2.41 u[IU]/mL (ref 0.35–5.50)

## 2021-12-23 LAB — T4, FREE: Free T4: 0.39 ng/dL — ABNORMAL LOW (ref 0.60–1.60)

## 2021-12-23 MED ORDER — METHIMAZOLE 5 MG PO TABS: 2.5000 mg | ORAL_TABLET | ORAL | 3 refills | Status: DC

## 2022-01-29 ENCOUNTER — Other Ambulatory Visit: Payer: Self-pay | Admitting: Family Medicine

## 2022-02-22 ENCOUNTER — Ambulatory Visit: Payer: 59 | Admitting: Family Medicine

## 2022-02-22 ENCOUNTER — Other Ambulatory Visit (INDEPENDENT_AMBULATORY_CARE_PROVIDER_SITE_OTHER): Payer: 59

## 2022-02-22 DIAGNOSIS — E0500 Thyrotoxicosis with diffuse goiter without thyrotoxic crisis or storm: Secondary | ICD-10-CM | POA: Diagnosis not present

## 2022-02-22 LAB — T4, FREE: Free T4: 0.51 ng/dL — ABNORMAL LOW (ref 0.60–1.60)

## 2022-02-22 LAB — TSH: TSH: 2.46 u[IU]/mL (ref 0.35–5.50)

## 2022-02-22 LAB — T3, FREE: T3, Free: 3.3 pg/mL (ref 2.3–4.2)

## 2022-04-18 ENCOUNTER — Other Ambulatory Visit: Payer: Self-pay | Admitting: Internal Medicine

## 2022-05-03 ENCOUNTER — Ambulatory Visit (INDEPENDENT_AMBULATORY_CARE_PROVIDER_SITE_OTHER): Payer: 59 | Admitting: Family Medicine

## 2022-05-03 ENCOUNTER — Encounter: Payer: Self-pay | Admitting: Family Medicine

## 2022-05-03 VITALS — BP 126/84 | HR 54 | Temp 97.8°F | Ht 61.5 in | Wt 188.2 lb

## 2022-05-03 DIAGNOSIS — F3170 Bipolar disorder, currently in remission, most recent episode unspecified: Secondary | ICD-10-CM

## 2022-05-03 DIAGNOSIS — G251 Drug-induced tremor: Secondary | ICD-10-CM

## 2022-05-03 DIAGNOSIS — I10 Essential (primary) hypertension: Secondary | ICD-10-CM | POA: Diagnosis not present

## 2022-05-03 DIAGNOSIS — E875 Hyperkalemia: Secondary | ICD-10-CM | POA: Diagnosis not present

## 2022-05-03 DIAGNOSIS — N289 Disorder of kidney and ureter, unspecified: Secondary | ICD-10-CM

## 2022-05-03 LAB — RENAL FUNCTION PANEL
Albumin: 3.7 g/dL (ref 3.5–5.2)
BUN: 21 mg/dL (ref 6–23)
CO2: 27 mEq/L (ref 19–32)
Calcium: 9.8 mg/dL (ref 8.4–10.5)
Chloride: 101 mEq/L (ref 96–112)
Creatinine, Ser: 1.04 mg/dL (ref 0.40–1.20)
GFR: 58.12 mL/min — ABNORMAL LOW (ref >60.00)
Glucose, Bld: 99 mg/dL (ref 70–99)
Phosphorus: 3.5 mg/dL (ref 2.3–4.6)
Potassium: 4.6 mEq/L (ref 3.5–5.1)
Sodium: 134 mEq/L — ABNORMAL LOW (ref 135–145)

## 2022-05-03 NOTE — Assessment & Plan Note (Signed)
Chronic, stable period on new med lisinopril  daily - update renal panel.

## 2022-05-03 NOTE — Patient Instructions (Signed)
Labs today to check kidneys and potassium levels.  Continue current medicines. Return in 6 months for physical

## 2022-05-03 NOTE — Assessment & Plan Note (Signed)
-  Continue propranolol 

## 2022-05-03 NOTE — Assessment & Plan Note (Signed)
She continues Lithium along with wellbutrin and buspar and celexa through psychiatrist.  Did not tolerate trial of other mood stabilizers.

## 2022-05-03 NOTE — Assessment & Plan Note (Signed)
Update K on ACEI (lisinopril ) in place of ARB (losartan )

## 2022-05-03 NOTE — Assessment & Plan Note (Addendum)
Update PTH in h/o elevated calcium levels.  No h/o kidney stones.

## 2022-05-03 NOTE — Assessment & Plan Note (Signed)
Update renal panel 

## 2022-05-03 NOTE — Progress Notes (Signed)
Ph: [phone]       Fax: [phone]   Patient ID: Katelyn Price, female    DOB: [birth year], 61 y.o.   MRN: [id number]  This visit was conducted in person.  BP 126/84   Pulse (!) 54   Temp 97.8 F (36.6 C) (Temporal)   Ht 5' 1.5" (1.562 m)   Wt 188 lb 4 oz (85.4 kg)   SpO2 99%   BMI 34.99 kg/m    CC: 6 mo f/u visit  Subjective:   HPI: Katelyn Price is a 62 y.o. female presenting on 05/03/2022 for Medical Management of Chronic Issues (Here for 6 mo f/u.)   Tremors - saw Dr Anne Hahn - thought lithium induced. Continues propranolol SR  nightly. She continues lithium  nightly for bipolar disorder through psychiatry Ellis Savage).   HTN - Compliant with current antihypertensive regimen of lisinopril  daily, ptropraolol  nightly. This was changed from losartan  due to persistent hyperkalemia. Does not check blood pressures at home. No low blood pressure readings or symptoms of dizziness/syncope. Denies vision changes, CP/tightness, SOB, leg swelling.   Frequent headaches with occ blurry vision and nausea, no double vision. Points to top of head (vertex) - pain and pressure - as well as pain in eyes. No photo/phonophobia, vomiting, not activity limiting but laying down helps. + tinnitus. Notes frequent urination.   Saw eye doctor this year - new glasses.      Relevant past medical, surgical, family and social history reviewed and updated as indicated. Interim medical history since our last visit reviewed. Allergies and medications reviewed and updated. Outpatient Medications Prior to Visit  Medication Sig Dispense Refill   buPROPion (WELLBUTRIN XL) 150 MG 24 hr tablet Take 2 tablets (300 mg total) by mouth daily.     busPIRone (BUSPAR) 7.5 MG tablet Take 7.5 mg by mouth 3 (three) times daily.     Calcium-Magnesium-Zinc 333-133-5 MG TABS Take by mouth. Takes 1 tablet 3 times a week     Cholecalciferol (VITAMIN D3) 125 MCG (5000 UT) CAPS Take 5,000 Units  by mouth daily.      citalopram (CELEXA) 20 MG tablet Take 1 tablet (20 mg total) by mouth daily.     Cyanocobalamin (VITAMELTS ENERGY VITAMIN B-12) 1500 MCG TBDP Take 1 tablet by mouth every Monday, Wednesday, and Friday.     estradiol (ESTRACE) 2 MG tablet Take 2 mg by mouth daily.     ferrous sulfate 324 (65 Fe) MG TBEC Take 1 tablet (325 mg total) by mouth 3 (three) times a week.     lisinopril (ZESTRIL) 5 MG tablet Take 1 tablet (5 mg total) by mouth daily. 90 tablet 3   lithium carbonate (ESKALITH) 450 MG CR tablet Take 450 mg by mouth at bedtime.      methimazole (TAPAZOLE) 5 MG tablet TAKE 1/2 TABLET BY MOUTH DAILY 45 tablet 0   pantoprazole (PROTONIX) 40 MG tablet TAKE 1 TABLET BY MOUTH EVERY DAY 90 tablet 0   progesterone (PROMETRIUM) 200 MG capsule Take 200 mg by mouth at bedtime.      propranolol ER (INDERAL LA) 120 MG 24 hr capsule Take 120 mg by mouth at bedtime.      tretinoin (RETIN-A) 0.05 % cream Apply 1 application topically at bedtime.     triamcinolone (KENALOG) 0.1 % SMARTSIG:1 Application Topical 2-3 Times Daily     ziprasidone (GEODON) 80 MG capsule Take 240 mg by mouth at bedtime.  No facility-administered medications prior to visit.     Per HPI unless specifically indicated in ROS section below Review of Systems  Objective:  BP 126/84   Pulse (!) 54   Temp 97.8 F (36.6 C) (Temporal)   Ht 5' 1.5" (1.562 m)   Wt 188 lb 4 oz (85.4 kg)   SpO2 99%   BMI 34.99 kg/m   Wt Readings from Last 3 Encounters:  05/03/22 188 lb 4 oz (85.4 kg)  11/08/21 182 lb 4 oz (82.7 kg)  07/08/21 177 lb 3 oz (80.4 kg)      Physical Exam Vitals and nursing note reviewed.  Constitutional:      Appearance: Normal appearance. She is not ill-appearing.  Cardiovascular:     Rate and Rhythm: Normal rate and regular rhythm.     Pulses: Normal pulses.     Heart sounds: Normal heart sounds. No murmur heard. Pulmonary:     Effort: Pulmonary effort is normal. No respiratory  distress.     Breath sounds: Normal breath sounds. No wheezing, rhonchi or rales.  Musculoskeletal:     Right lower leg: No edema.     Left lower leg: No edema.  Skin:    General: Skin is warm and dry.     Findings: No rash.  Neurological:     Mental Status: She is alert.     Motor: Tremor present.     Comments: Marked tremor to BUE       Results for orders placed or performed in visit on 02/22/22  T3, free  Result Value Ref Range   T3, Free 3.3 2.3 - 4.2 pg/mL  T4, free  Result Value Ref Range   Free T4 0.51 (L) 0.60 - 1.60 ng/dL  TSH  Result Value Ref Range   TSH 2.46 0.35 - 5.50 uIU/mL   Lab Results  Component Value Date   CREATININE 1.02 11/04/2021   BUN 20 11/04/2021   NA 133 (L) 11/04/2021   K 5.3 No hemolysis seen (H) 11/04/2021   CL 100 11/04/2021   CO2 28 11/04/2021   GFR = 59.7  Assessment & Plan:   Problem List Items Addressed This Visit     Bipolar disorder    She continues Lithium along with wellbutrin and buspar and celexa through psychiatrist.  Did not tolerate trial of other mood stabilizers.       HYPERTENSION, BENIGN ESSENTIAL - Primary    Chronic, stable period on new med lisinopril  daily - update renal panel.      Relevant Orders   Renal function panel   Drug-induced tremor    Continue propranolol.      Renal insufficiency    Update renal panel.       Hypercalcemia    Update PTH in h/o elevated calcium levels.  No h/o kidney stones.       Relevant Orders   Parathyroid hormone, intact (no Ca)   Hyperkalemia    Update K on ACEI (lisinopril ) in place of ARB (losartan )        No orders of the defined types were placed in this encounter.   Orders Placed This Encounter  Procedures   Renal function panel   Parathyroid hormone, intact (no Ca)    Patient Instructions  Labs today to check kidneys and potassium levels.  Continue current medicines. Return in 6 months for physical  Follow up plan: Return in  about 6 months (around 11/02/2022) for annual exam, prior fasting  for blood work.  Ria Bush, MD

## 2022-05-04 LAB — PARATHYROID HORMONE, INTACT (NO CA): PTH: 87 pg/mL — ABNORMAL HIGH (ref 16–77)

## 2022-05-07 ENCOUNTER — Encounter: Payer: Self-pay | Admitting: Family Medicine

## 2022-05-07 ENCOUNTER — Other Ambulatory Visit: Payer: Self-pay | Admitting: Family Medicine

## 2022-05-07 DIAGNOSIS — E211 Secondary hyperparathyroidism, not elsewhere classified: Secondary | ICD-10-CM

## 2022-05-07 DIAGNOSIS — E2839 Other primary ovarian failure: Secondary | ICD-10-CM

## 2022-05-08 ENCOUNTER — Other Ambulatory Visit: Payer: Self-pay | Admitting: Family Medicine

## 2022-06-03 ENCOUNTER — Encounter: Payer: Self-pay | Admitting: Internal Medicine

## 2022-06-03 ENCOUNTER — Ambulatory Visit (INDEPENDENT_AMBULATORY_CARE_PROVIDER_SITE_OTHER): Payer: 59 | Admitting: Internal Medicine

## 2022-06-03 VITALS — BP 120/84 | HR 50 | Ht 61.5 in | Wt 185.4 lb

## 2022-06-03 DIAGNOSIS — E213 Hyperparathyroidism, unspecified: Secondary | ICD-10-CM

## 2022-06-03 DIAGNOSIS — E0500 Thyrotoxicosis with diffuse goiter without thyrotoxic crisis or storm: Secondary | ICD-10-CM

## 2022-06-03 DIAGNOSIS — T56891A Toxic effect of other metals, accidental (unintentional), initial encounter: Secondary | ICD-10-CM

## 2022-06-03 DIAGNOSIS — E042 Nontoxic multinodular goiter: Secondary | ICD-10-CM | POA: Diagnosis not present

## 2022-06-03 NOTE — Progress Notes (Unsigned)
Patient ID: Katelyn Price, female   DOB: [birth year], 62 y.o.   MRN: [id number]   HPI  Katelyn Price is a 62 y.o.-year-old female, presenting for f/u for Graves ds. and thyroid nodules. Last visit 1 year ago.  Interim history: She denies palpitations, weight loss, but has tremors, which are longstanding, due to lithium therapy. Since last visit she was found to have hyperparathyroidism, possibly due to lithium therapy.  No constipation, kidney stones. She tells me she recently saw her psychiatrist, who recommended against stopping lithium, as this is working well for her. She does complain of weight gain.  Reviewed history: Patient was diagnosed with thyrotoxicosis in summer 2017 and was confirmed as Graves' disease on an uptake and scan from 10/2015.   Reviewed her treatment history: She was initially started on methimazole 10 mg twice a day, then decreased to 5 mg twice a day in 02/2016 after which TFTs normalized.  We were able to decrease the dose further to 5 mg daily.  We have tried an even lower dose but her TSH became suppressed on less than 5 mg methimazole daily..  In 01/2017, we increase the dose of methimazole to 5 mg twice a day of methimazole  In 11/2017, we were able to decrease the dose to 5 mg once a day.  In 03/2018 we were able to decrease the dose to 2.5 mg once a day.  In 10/2018, we continued methimazole 2.5 mg once a day  In 04/2019, we continued the same dose.  In 06/2020, we stopped MMI.  In 06/2021, we restarted MMI 2.5 mg daily.  She takes a 5 mg tablet every other day.  Reviewed her TFTs: Lab Results  Component Value Date   TSH 2.46 02/22/2022   TSH 2.41 12/23/2021   TSH 1.19 11/04/2021   TSH 0.76 09/22/2021   TSH 0.14 (L) 07/23/2021   TSH 0.16 (L) 06/11/2021   TSH 0.67 08/04/2020   TSH 1.55 06/11/2020   TSH 2.14 12/12/2019   TSH 0.54 04/22/2019   FREET4 0.51 (L) 02/22/2022   FREET4 0.39 (L) 12/23/2021   FREET4 0.48 (L) 11/04/2021   FREET4  0.45 (L) 09/22/2021   FREET4 0.55 (L) 07/23/2021   FREET4 0.58 (L) 06/11/2021   FREET4 0.48 (L) 08/04/2020   FREET4 0.40 (L) 06/11/2020   FREET4 0.48 (L) 12/12/2019   FREET4 0.51 (L) 04/22/2019   Lab Results  Component Value Date   T3FREE 3.3 02/22/2022   T3FREE 3.1 12/23/2021   T3FREE 3.0 09/22/2021   T3FREE 2.7 07/23/2021   T3FREE 3.5 06/11/2021   T3FREE 3.2 08/04/2020   T3FREE 2.8 06/11/2020   T3FREE 3.3 12/12/2019   T3FREE 3.4 04/22/2019   T3FREE 3.3 10/12/2018   Her Graves' antibodies were elevated: Lab Results  Component Value Date   TSI 368 (H) 06/11/2020   TSI 411 (H) 07/09/2018   TSI 499 (H) 05/31/2017   TSI 442 (H) 11/14/2016   TSI 630 (H) 10/14/2015   Component     Latest Ref Rng & Units 08/25/2015  Thyrotropin Receptor Ab     <=16.0 % 41.4 (H)   Reviewed history: Thyroid ultrasound on 09/04/2015: Right thyroid lobe: 4.1 cm x 1.7 cm x 1.9 cm. Relatively homogeneous appearance of right thyroid tissue.   Right thyroid nodule #1 measures 0.7 cm x 0.7 cm x 0.7 cm mixed cystic and solid composition (1), with hypoechoic echogenicity (2),  taller than wide shape (3) Left thyroid lobe: 5.1 cm x  1.9 cm x 2.4 cm. Relatively homogeneous appearance of the left thyroid.   *Inferior left nodule #1 measures 1.5 cm x 1.1 cm x 1.3 cm Nearly completely solid composition (2), with isoechoic  echogenicity (1).   There are 3 additional separate nodules all measuring less than 5 mm with characteristics compatible with colloid cysts. Isthmus Thickness: 0.4 cm.  No nodules visualized. Lymphadenopathy: None visualized.  Thyroid Uptake and scan (10/22/2015) >> Graves' disease.: 24 hour radioactive iodine uptake 36.9%.Diffuse radiotracer uptake identified within both lobes of the thyroid gland.    Thyroid U/S (10/26/2018): Stable, small nodules: Parenchymal Echotexture: Moderately heterogenous Isthmus: Normal in size measuring 0.4 cm in diameter Right lobe: Normal in size measuring  5.2 x 2.1 x 2.2 cm, unchanged previously, 4.1 x 1.7 x 1.9 cm Left lobe: Normal in size measuring 5.4 x 2.0 x 2.4 cm, unchanged, previously, 5.1 x 1.9 x 2.4 cm _________________________________________________________   Previously noted approximately 0.7 cm minimally complex cyst within the inferior pole the right lobe of the thyroid is not demonstrated on the present examination.  __________________________________________________   The previously noted approximately 1.5 x 1.3 x 1.1 cm isoechoic nodule within the mid, inferior aspect the left lobe of the thyroid is not seen on the present examination and thus may have represented a pseudonodule.   There is an approximately 1.0 x 1.0 x 0.8 cm isoechoic ill-defined nodule/pseudonodule within the mid aspect the left lobe of the thyroid (labeled 1) was not definitely seen on the 2017 examination however does not meet imaging criteria to recommend percutaneous sampling or continued dedicated follow-up.   There is an approximately 1.0 x 0.9 x 0.9 cm partially cystic, partially solid nodule within the inferior pole the left lobe of the thyroid (labeled 2), which was not definitely seen on the 2017 examination, however does not meet imaging criteria to recommend percutaneous sampling or continued dedicated follow-up.   IMPRESSION: 1. Findings suggestive of multinodular goiter. 2. No worrisome new or enlarging thyroid nodules. 3. None of the discretely measured thyroid nodules meet imaging criteria to recommend percutaneous sampling or continued dedicated follow-up.   She is on propranolol 120 mg in a.m. for tremors.  On lithium for bipolar disorder.  Pt denies: - feeling nodules in neck - hoarseness - dysphagia - choking  Pt does have a FH of thyroid ds on father's side of the family. No FH of thyroid cancer. No h/o radiation tx to head or neck. No herbal supplements. No Biotin use. No recent steroids use.   Hypercalcemia:  Pt 2  instances of elevated total calcium and also hyperparathyroidism. I reviewed pt's pertinent labs: Lab Results  Component Value Date   PTH 87 (H) 05/03/2022   PTH 75 (H) 08/11/2014   CALCIUM 9.8 05/03/2022   CALCIUM 10.6 (H) 11/04/2021   CALCIUM 10.2 08/12/2020   CALCIUM 10.5 (H) 09/26/2019   CALCIUM 10.1 08/16/2019   CALCIUM 9.9 02/15/2019   CALCIUM 10.1 08/22/2018   CALCIUM 10.2 08/15/2018   CALCIUM 10.1 08/09/2018   CALCIUM 10.4 10/05/2017   No fragility fractures but had a recent fall.   No h/o kidney stones.  No h/o CKD. Last BUN/Cr: Lab Results  Component Value Date   BUN 21 05/03/2022   BUN 20 11/04/2021   CREATININE 1.04 05/03/2022   CREATININE 1.02 11/04/2021   Pt is not on HCTZ.  + h/o vitamin D insufficiency. Reviewed vit D levels: Lab Results  Component Value Date   VD25OH 82.82 11/04/2021  VD25OH 86.55 08/12/2020   VD25OH 57.30 08/16/2019   VD25OH 56.99 08/11/2014   VD25OH 74 06/14/2010   VD25OH 21 (L) 05/04/2009   Pt is on calcium 333  mg 3x a week, and takes 5000 units vitamin D daily.  Pt does not have a FH of hypercalcemia, pituitary tumors, thyroid cancer, or osteoporosis.   Pt. also has a history of GERD, psoriasis, bipolar dis., HTN, B12 deficiency.  + History of vaginal spotting-seeing OB/GYN (Dr. Lloyd Huger).  On HRT. She had right TKR in 09/2019 >> she felt  much better after the sx.  ROS: + see HPI  I reviewed pt's medications, allergies, PMH, social hx, family hx, and changes were documented in the history of present illness. Otherwise, unchanged from my initial visit note.  Past Medical History:  Diagnosis Date   Abnormal involuntary movements(781.0)    Acne    Allergic rhinitis    Arthritis    Asthma    Bipolar disorder, unspecified (HCC)    psych - Dr. Misty Stanley   Common migraine with intractable migraine 10/09/2018   Depression    Diaphragmatic hernia without mention of obstruction or gangrene    GERD (gastroesophageal reflux disease)     03/18/02, EGD, [facility] Gerd,gastritis, dilated   History of MRI of brain and brain stem 06/01/03   [facility]   Hypertension    Other psoriasis    Tremor of both hands 10/09/2018   Unspecified sinusitis (chronic)    Past Surgical History:  Procedure Laterality Date   COLONOSCOPY  03/18/02   internal hemorrhoids   COLONOSCOPY  06/2014   diverticulosis, rpt 10 yrs ([facility])   ESOPHAGOGASTRODUODENOSCOPY  1/99   [facility]; HH,GERD   ESOPHAGOGASTRODUODENOSCOPY  03/18/02   HH,GERD,Gastritis; Dilated   LAPAROSCOPIC ESOPHAGOGASTRIC FUNDOPLASTY  08/28/02   [name]   TOTAL HIP ARTHROPLASTY Right 10/04/2019   Procedure: RIGHT TOTAL HIP ARTHROPLASTY ANTERIOR APPROACH;  Surgeon: Kathryne Hitch, MD;  Location: [facility];  Service: Orthopedics;  Laterality: Right;   Social History   Social History   Marital status: Married    Spouse name: N/A   Number of children: 0   Occupational History   Housewife    Social History Main Topics   Smoking status: Former Smoker    Packs/day: 1.00    Years: 20.00    Types: Cigarettes    Quit date: 01/10/1998   Smokeless tobacco: Never Used   Alcohol use No   Drug use: No   Social History Narrative   Caffeine: drinks diet drinks   Lives with husband and 1 dog, 1 cat   Occupation: housewife   Activity: no regular exercise, doesn't feel safe to walk outside at home, could walk on mother's treadmill   Diet: good water, fruits/vegetables some    Current Outpatient Medications on File Prior to Visit  Medication Sig Dispense Refill   buPROPion (WELLBUTRIN XL) 150 MG 24 hr tablet Take 2 tablets (300 mg total) by mouth daily.     busPIRone (BUSPAR) 7.5 MG tablet Take 7.5 mg by mouth 3 (three) times daily.     Calcium-Magnesium-Zinc 333-133-5 MG TABS Take by mouth. Takes 1 tablet 3 times a week     Cholecalciferol (VITAMIN D3) 125 MCG (5000 UT) CAPS Take 5,000 Units by mouth daily.      citalopram (CELEXA) 20 MG tablet Take 1 tablet (20 mg total) by mouth daily.      Cyanocobalamin (VITAMELTS ENERGY VITAMIN B-12) 1500 MCG TBDP Take 1 tablet by mouth every  Monday, Wednesday, and Friday.     estradiol (ESTRACE) 2 MG tablet Take 2 mg by mouth daily.     ferrous sulfate 324 (65 Fe) MG TBEC Take 1 tablet (325 mg total) by mouth 3 (three) times a week.     lisinopril (ZESTRIL) 5 MG tablet Take 1 tablet (5 mg total) by mouth daily. 90 tablet 3   lithium carbonate (ESKALITH) 450 MG CR tablet Take 450 mg by mouth at bedtime.      methimazole (TAPAZOLE) 5 MG tablet TAKE 1/2 TABLET BY MOUTH DAILY 45 tablet 0   pantoprazole (PROTONIX) 40 MG tablet TAKE 1 TABLET BY MOUTH EVERY DAY 90 tablet 0   progesterone (PROMETRIUM) 200 MG capsule Take 200 mg by mouth at bedtime.      propranolol ER (INDERAL LA) 120 MG 24 hr capsule Take 120 mg by mouth at bedtime.      tretinoin (RETIN-A) 0.05 % cream Apply 1 application topically at bedtime.     triamcinolone (KENALOG) 0.1 % SMARTSIG:1 Application Topical 2-3 Times Daily     ziprasidone (GEODON) 80 MG capsule Take 240 mg by mouth at bedtime.     No current facility-administered medications on file prior to visit.   Allergies  Allergen Reactions   Amlodipine Swelling    Ankle swelling   Hydrocodone-Acetaminophen     Pt says it does not work for her   Minocycline Hcl Itching   Family History  Problem Relation Age of Onset   Arthritis Mother        fibromyalgia   Alcohol abuse Father    Emphysema Brother        smoker   COPD Brother        smoker, continues.   Arthritis Sister        fibromyalgia   Cancer Neg Hx    Coronary artery disease Neg Hx    Stroke Neg Hx    Diabetes Neg Hx    PE: BP 120/84 (BP Location: Left Arm, Patient Position: Sitting, Cuff Size: Normal)   Pulse (!) 50   Ht 5' 1.5" (1.562 m)   Wt 185 lb 6.4 oz (84.1 kg)   SpO2 99%   BMI 34.46 kg/m  Wt Readings from Last 3 Encounters:  06/03/22 185 lb 6.4 oz (84.1 kg)  05/03/22 188 lb 4 oz (85.4 kg)  11/08/21 182 lb 4 oz (82.7 kg)    Constitutional: overweight, in NAD Eyes: EOMI, no exophthalmos ENT: no thyromegaly, no cervical lymphadenopathy Cardiovascular: RRR, No MRG Respiratory: CTA B Musculoskeletal: no deformities Skin: no rashes Neurological: +++ tremor with outstretched hands   ASSESSMENT: 1.  Graves' disease  2. Thyroid nodules  3.  Hyperparathyroidism  PLAN:  1. Patient with history of thyrotoxicosis diagnosed when she presented with thyrotoxic symptoms: Tremors (however, she has chronic tremors due to lithium), heat intolerance, palpitations, shortness of breath.  She started methimazole and we were able to decrease the dose gradually and stopped methimazole completely in 06/2020.  TFTs were normal afterwards, however, at last visit, the TSH was suppressed.  We started back on methimazole, currently on 2.5 mg every other day, dose decreased 12/2021.  On this dose, the latest TFTs were normal in 02/2022 -At this visit, she continues to have tremors, which are chronic for her and for which she is on Inderal; and also some heat intolerance. She also c/o weight gain - 10 lbs since last OV -Her TSI antibodies were still elevated at last check but improved. -  At today's visit we will repeat her TFTs and change the methimazole dose accordingly -She continues on beta-blocker for tremors, but we discussed that this can also help with heart rate in the setting of thyrotoxicosis -I will see her back in 6 months  2. Thyroid nodules -She denies neck compression symptoms -She has a history of a 1 cm by cell-or slightly hypo-- echoic thyroid nodule, with internal blood flow on her ultrasound from 08/2015.  The nodule appeared warm on the uptake and scan, which can either indicate an inflammatory nodule (pseudonodule) or a normal functioning nodule.  Since the nodule was seen in the context of thyrotoxicosis, we decided to wait until she was euthyroid to repeat the ultrasound then.  We repeated a thyroid ultrasound in  10/2018 and this showed that 2 of her thyroid nodules were not visible anymore and 2 nodules were very small, not worrisome. -Will continue to follow her clinically, no need for further ultrasounds  3.  Hyperparathyroidism -new pb for me -Possibly lithium induced, per PCP evaluation -Calcium levels reviewed and they are either normal or slightly above target -Vitamin D level normal last year, but close to the upper limit of the target range.  She is taking 5000 units vitamin D daily and I advised her to reduce the dose to every other day.  She brings a list of her medications and supplements that she is taking a calcium supplement, 333 mg 3 times a week.  I advised her to stop the supplement, however, she does not need to reduce the calcium in her diet. - No apparent complications from hypercalcemia: no h/o nephrolithiasis, no osteoporosis, no fractures. No abdominal pain, depression, bone pain. - I discussed with the patient about the physiology of calcium and parathyroid hormone, and possible side effects from increased PTH, including kidney stones, osteoporosis, abdominal pain, etc.  - We discussed that we need to check whether her hyperparathyroidism is primary (Familial hypercalcemic hypocalciuria or parathyroid adenoma) or secondary (to conditions like: Medications (lithium, in her case), vitamin D deficiency, calcium malabsorption, hypercalciuria, renal insufficiency, etc.). -At today's visit, we will check Ionized calcium level intact PTH ([facility]) Magnesium Phosphorus vitamin D- 25 HO and 1,25 HO We may also need a 24h urinary calcium/creatinine ratio after the above results returned - We discussed possible consequences of hyperparathyroidism: ~1/3 pts will develop complications over 15 years (OP, nephrolithiasis).  - if she has lithium induced hyperparathyroidism, coming off lithium may improve the calcium levels, but since they are at the upper limit of normal or slightly above, and  since she has been on lithium for years, stopping lithium may be more deleterious for her.  Indeed, her psychiatrist recommended against this. - If the tests indicate a parathyroid adenoma, we may need a referral to surgery, although, newer studies show that mild primary hyperparathyroidism could be managed expectantly if there are no complications - Criteria for parathyroid surgery are:  Increased calcium by more than 1 mg/dL above the upper limit of normal  Kidney ds.  Osteoporosis (or vertebral fracture) Age <63 years old Newer criteria (2013): High UCa >400 mg/d and increased stone risk by biochemical stone risk analysis Presence of nephrolithiasis or nephrocalcinosis Pt's preference!  - a DXA scan is pending for her.  Will review this when results are available. - I will see the patient back in 6 months  - Total time spent for the visit: 40 min, in precharting, postcharting, obtaining medical information from the chart and from the pt,  reviewing her  previous labs, imaging evaluations, and treatments, reviewing her symptoms, counseling her about her endocrine problems (please see the discussed topics above), and developing a plan to further treat them.  Orders Placed This Encounter  Procedures   TSH   T4, free   T3, free   Parathyroid hormone, intact (no Ca)   Magnesium   Calcium, ionized   VITAMIN D 25 Hydroxy (Vit-D Deficiency, Fractures)   Vitamin D 1,25 dihydroxy   Phosphorus   Carlus Pavlov, MD PhD [facility]

## 2022-06-03 NOTE — Patient Instructions (Addendum)
Please continue Methimazole 5 mg every other daily.  Please take vitamin D 5000 units every other day.  Stop the calcium supplement.  Please stop at the lab.  Please come back for a follow-up appointment in 6 months.

## 2022-06-08 ENCOUNTER — Other Ambulatory Visit: Payer: Self-pay

## 2022-06-08 ENCOUNTER — Other Ambulatory Visit (INDEPENDENT_AMBULATORY_CARE_PROVIDER_SITE_OTHER): Payer: 59

## 2022-06-08 DIAGNOSIS — E0500 Thyrotoxicosis with diffuse goiter without thyrotoxic crisis or storm: Secondary | ICD-10-CM

## 2022-06-08 LAB — MAGNESIUM: Magnesium: 1.7 mg/dL (ref 1.5–2.5)

## 2022-06-08 LAB — VITAMIN D 25 HYDROXY (VIT D DEFICIENCY, FRACTURES): VITD: 89.62 ng/mL (ref 30.00–100.00)

## 2022-06-08 LAB — T3, FREE: T3, Free: 2.7 pg/mL (ref 2.3–4.2)

## 2022-06-08 LAB — T4, FREE: Free T4: 0.5 ng/dL — ABNORMAL LOW (ref 0.60–1.60)

## 2022-06-08 LAB — TSH: TSH: 1.65 u[IU]/mL (ref 0.35–5.50)

## 2022-06-08 LAB — PHOSPHORUS: Phosphorus: 3 mg/dL (ref 2.3–4.6)

## 2022-06-11 LAB — VITAMIN D 1,25 DIHYDROXY
Vitamin D 1, 25 (OH)2 Total: 53 pg/mL (ref 18–72)
Vitamin D2 1, 25 (OH)2: <8 pg/mL
Vitamin D3 1, 25 (OH)2: 53 pg/mL

## 2022-06-11 LAB — PARATHYROID HORMONE, INTACT (NO CA): PTH: 57 pg/mL (ref 16–77)

## 2022-06-11 LAB — CALCIUM, IONIZED: Calcium, Ion: 5.6 mg/dL — ABNORMAL HIGH (ref 4.7–5.5)

## 2022-06-14 ENCOUNTER — Other Ambulatory Visit: Payer: Self-pay | Admitting: Internal Medicine

## 2022-06-14 MED ORDER — METHIMAZOLE 5 MG PO TABS: 2.5000 mg | ORAL_TABLET | ORAL | 0 refills | Status: DC

## 2022-06-21 ENCOUNTER — Encounter: Payer: Self-pay | Admitting: Internal Medicine

## 2022-06-27 ENCOUNTER — Other Ambulatory Visit: Payer: Self-pay | Admitting: Internal Medicine

## 2022-06-27 DIAGNOSIS — E0500 Thyrotoxicosis with diffuse goiter without thyrotoxic crisis or storm: Secondary | ICD-10-CM

## 2022-07-21 ENCOUNTER — Other Ambulatory Visit (INDEPENDENT_AMBULATORY_CARE_PROVIDER_SITE_OTHER): Payer: 59

## 2022-07-21 DIAGNOSIS — E0500 Thyrotoxicosis with diffuse goiter without thyrotoxic crisis or storm: Secondary | ICD-10-CM

## 2022-07-21 LAB — T3, FREE: T3, Free: 2.8 pg/mL (ref 2.3–4.2)

## 2022-07-21 LAB — T4, FREE: Free T4: 0.48 ng/dL — ABNORMAL LOW (ref 0.60–1.60)

## 2022-07-21 LAB — TSH: TSH: 2.22 u[IU]/mL (ref 0.35–5.50)

## 2022-08-28 ENCOUNTER — Other Ambulatory Visit: Payer: Self-pay | Admitting: Family Medicine

## 2022-09-28 LAB — HM MAMMOGRAPHY

## 2022-10-18 ENCOUNTER — Other Ambulatory Visit: Payer: Self-pay | Admitting: Family Medicine

## 2022-11-07 ENCOUNTER — Other Ambulatory Visit: Payer: Self-pay | Admitting: Family Medicine

## 2022-11-07 DIAGNOSIS — T56891A Toxic effect of other metals, accidental (unintentional), initial encounter: Secondary | ICD-10-CM

## 2022-11-07 DIAGNOSIS — E0500 Thyrotoxicosis with diffuse goiter without thyrotoxic crisis or storm: Secondary | ICD-10-CM

## 2022-11-07 DIAGNOSIS — F3170 Bipolar disorder, currently in remission, most recent episode unspecified: Secondary | ICD-10-CM

## 2022-11-07 DIAGNOSIS — R7301 Impaired fasting glucose: Secondary | ICD-10-CM

## 2022-11-07 DIAGNOSIS — T43595A Adverse effect of other antipsychotics and neuroleptics, initial encounter: Secondary | ICD-10-CM

## 2022-11-07 DIAGNOSIS — E538 Deficiency of other specified B group vitamins: Secondary | ICD-10-CM

## 2022-11-07 DIAGNOSIS — N289 Disorder of kidney and ureter, unspecified: Secondary | ICD-10-CM

## 2022-11-09 ENCOUNTER — Other Ambulatory Visit (INDEPENDENT_AMBULATORY_CARE_PROVIDER_SITE_OTHER): Payer: 59

## 2022-11-09 DIAGNOSIS — N289 Disorder of kidney and ureter, unspecified: Secondary | ICD-10-CM | POA: Diagnosis not present

## 2022-11-09 DIAGNOSIS — E538 Deficiency of other specified B group vitamins: Secondary | ICD-10-CM | POA: Diagnosis not present

## 2022-11-09 DIAGNOSIS — F3170 Bipolar disorder, currently in remission, most recent episode unspecified: Secondary | ICD-10-CM

## 2022-11-09 DIAGNOSIS — E211 Secondary hyperparathyroidism, not elsewhere classified: Secondary | ICD-10-CM | POA: Diagnosis not present

## 2022-11-09 DIAGNOSIS — R7301 Impaired fasting glucose: Secondary | ICD-10-CM

## 2022-11-09 DIAGNOSIS — T56891A Toxic effect of other metals, accidental (unintentional), initial encounter: Secondary | ICD-10-CM | POA: Diagnosis not present

## 2022-11-09 DIAGNOSIS — T43595A Adverse effect of other antipsychotics and neuroleptics, initial encounter: Secondary | ICD-10-CM | POA: Diagnosis not present

## 2022-11-09 LAB — COMPREHENSIVE METABOLIC PANEL
ALT: 12 U/L (ref 0–35)
AST: 13 U/L (ref 0–37)
Albumin: 3.9 g/dL (ref 3.5–5.2)
Alkaline Phosphatase: 57 U/L (ref 39–117)
BUN: 22 mg/dL (ref 6–23)
CO2: 25 meq/L (ref 19–32)
Calcium: 10.7 mg/dL — ABNORMAL HIGH (ref 8.4–10.5)
Chloride: 102 meq/L (ref 96–112)
Creatinine, Ser: 1.18 mg/dL (ref 0.40–1.20)
GFR: 49.76 mL/min — ABNORMAL LOW (ref >60.00)
Glucose, Bld: 108 mg/dL — ABNORMAL HIGH (ref 70–99)
Potassium: 4.5 meq/L (ref 3.5–5.1)
Sodium: 134 meq/L — ABNORMAL LOW (ref 135–145)
Total Bilirubin: 0.4 mg/dL (ref 0.2–1.2)
Total Protein: 6.5 g/dL (ref 6.0–8.3)

## 2022-11-09 LAB — CBC WITH DIFFERENTIAL/PLATELET
Basophils Absolute: 0.1 10*3/uL (ref 0.0–0.1)
Basophils Relative: 0.8 % (ref 0.0–3.0)
Eosinophils Absolute: 0.3 10*3/uL (ref 0.0–0.7)
Eosinophils Relative: 3.6 % (ref 0.0–5.0)
HCT: 39.2 % (ref 36.0–46.0)
Hemoglobin: 12.5 g/dL (ref 12.0–15.0)
Lymphocytes Relative: 12.1 % (ref 12.0–46.0)
Lymphs Abs: 1.1 10*3/uL (ref 0.7–4.0)
MCHC: 32 g/dL (ref 30.0–36.0)
MCV: 98.9 fL (ref 78.0–100.0)
Monocytes Absolute: 0.7 10*3/uL (ref 0.1–1.0)
Monocytes Relative: 8.2 % (ref 3.0–12.0)
Neutro Abs: 6.7 10*3/uL (ref 1.4–7.7)
Neutrophils Relative %: 75.3 % (ref 43.0–77.0)
Platelets: 249 10*3/uL (ref 150.0–400.0)
RBC: 3.97 Mil/uL (ref 3.87–5.11)
RDW: 13.4 % (ref 11.5–15.5)
WBC: 8.8 10*3/uL (ref 4.0–10.5)

## 2022-11-09 LAB — LIPID PANEL
Cholesterol: 179 mg/dL (ref 0–200)
HDL: 81.3 mg/dL (ref >39.00)
LDL Cholesterol: 75 mg/dL (ref 0–99)
NonHDL: 97.89
Total CHOL/HDL Ratio: 2
Triglycerides: 115 mg/dL (ref 0.0–149.0)
VLDL: 23 mg/dL (ref 0.0–40.0)

## 2022-11-09 LAB — MICROALBUMIN / CREATININE URINE RATIO
Creatinine,U: 28.3 mg/dL
Microalb Creat Ratio: 2.5 mg/g (ref 0.0–30.0)
Microalb, Ur: <0.7 mg/dL (ref 0.0–1.9)

## 2022-11-09 LAB — HEMOGLOBIN A1C: Hgb A1c MFr Bld: 5 % (ref 4.6–6.5)

## 2022-11-09 LAB — VITAMIN B12: Vitamin B-12: 597 pg/mL (ref 211–911)

## 2022-11-09 LAB — VITAMIN D 25 HYDROXY (VIT D DEFICIENCY, FRACTURES): VITD: 70.49 ng/mL (ref 30.00–100.00)

## 2022-11-10 LAB — LITHIUM LEVEL: Lithium Lvl: 1 mmol/L (ref 0.6–1.2)

## 2022-11-10 LAB — PARATHYROID HORMONE, INTACT (NO CA): PTH: 74 pg/mL (ref 16–77)

## 2022-11-16 ENCOUNTER — Encounter: Payer: Self-pay | Admitting: Family Medicine

## 2022-11-16 ENCOUNTER — Ambulatory Visit: Payer: 59 | Admitting: Family Medicine

## 2022-11-16 VITALS — BP 126/86 | HR 50 | Temp 98.1°F | Ht 62.5 in | Wt 178.2 lb

## 2022-11-16 DIAGNOSIS — N289 Disorder of kidney and ureter, unspecified: Secondary | ICD-10-CM

## 2022-11-16 DIAGNOSIS — Z23 Encounter for immunization: Secondary | ICD-10-CM | POA: Diagnosis not present

## 2022-11-16 DIAGNOSIS — E042 Nontoxic multinodular goiter: Secondary | ICD-10-CM

## 2022-11-16 DIAGNOSIS — Z0000 Encounter for general adult medical examination without abnormal findings: Secondary | ICD-10-CM

## 2022-11-16 DIAGNOSIS — N393 Stress incontinence (female) (male): Secondary | ICD-10-CM

## 2022-11-16 DIAGNOSIS — I10 Essential (primary) hypertension: Secondary | ICD-10-CM | POA: Diagnosis not present

## 2022-11-16 DIAGNOSIS — R21 Rash and other nonspecific skin eruption: Secondary | ICD-10-CM | POA: Insufficient documentation

## 2022-11-16 DIAGNOSIS — E211 Secondary hyperparathyroidism, not elsewhere classified: Secondary | ICD-10-CM

## 2022-11-16 DIAGNOSIS — G251 Drug-induced tremor: Secondary | ICD-10-CM

## 2022-11-16 DIAGNOSIS — K219 Gastro-esophageal reflux disease without esophagitis: Secondary | ICD-10-CM | POA: Diagnosis not present

## 2022-11-16 DIAGNOSIS — E538 Deficiency of other specified B group vitamins: Secondary | ICD-10-CM

## 2022-11-16 DIAGNOSIS — F3170 Bipolar disorder, currently in remission, most recent episode unspecified: Secondary | ICD-10-CM

## 2022-11-16 DIAGNOSIS — Z7189 Other specified counseling: Secondary | ICD-10-CM

## 2022-11-16 DIAGNOSIS — T56891A Toxic effect of other metals, accidental (unintentional), initial encounter: Secondary | ICD-10-CM

## 2022-11-16 DIAGNOSIS — L408 Other psoriasis: Secondary | ICD-10-CM

## 2022-11-16 DIAGNOSIS — R7301 Impaired fasting glucose: Secondary | ICD-10-CM

## 2022-11-16 DIAGNOSIS — T43595A Adverse effect of other antipsychotics and neuroleptics, initial encounter: Secondary | ICD-10-CM

## 2022-11-16 DIAGNOSIS — E0500 Thyrotoxicosis with diffuse goiter without thyrotoxic crisis or storm: Secondary | ICD-10-CM

## 2022-11-16 MED ORDER — PANTOPRAZOLE SODIUM 40 MG PO TBEC: 40.0000 mg | DELAYED_RELEASE_TABLET | Freq: Every day | ORAL | 4 refills | Status: DC

## 2022-11-16 MED ORDER — LISINOPRIL 5 MG PO TABS: 5.0000 mg | ORAL_TABLET | Freq: Every day | ORAL | 4 refills | Status: DC

## 2022-11-16 NOTE — Assessment & Plan Note (Signed)
Preventative protocols reviewed and updated unless pt declined. Discussed healthy diet and lifestyle.  

## 2022-11-16 NOTE — Progress Notes (Addendum)
Ph: [phone] Fax: [phone]   Patient ID: Katelyn Price, female    DOB: 12/03/1960, 62 y.o.   MRN: [id number]  This visit was conducted in person.  BP 126/86   Pulse (!) 50   Temp 98.1 F (36.7 C) (Oral)   Ht 5' 2.5" (1.588 m)   Wt 178 lb 4 oz (80.9 kg)   SpO2 98%   BMI 32.08 kg/m    CC: CPE Subjective:   HPI: Katelyn Price is a 62 y.o. female presenting on 11/16/2022 for Annual Exam (C/o irritated area in fold of L arm. )   Notes itchy red scaly spot to left elbow present for the past 1-2 weeks  No new lotions, detergents, soaps or shampoos.  No new medicines, vitamins, supplements. No new foods.   She did start naltrexone 50mg  bid for weight loss - for last several months.   S/p R hip replacement 09/2019 [facility])   Graves disease managed by endo Elvera Lennox), h/o thyroid nodules. She is now back on low dose methimazole.   Tremors - saw Dr Anne Hahn thought lithium induced tremor - advised taper off. Started on topamax for migraines - but actually worsened headaches so now off this.    Bipolar - followed by psychiatry NP Ellis Savage on lithium, celexa, buspar and wellbutrin. Last seen 03/2021.   GERD - continues daily PPI.   Hyperparathyroidism with borderline calcium levels. Saw endo, vit D replacement dropped, rec stop oral calcium supplements. No hypercalcemia complications. Upcoming DEXA.  Preventative: COLONOSCOPY 06/2014 diverticulosis, rpt 10 yrs [address])  Well woman with [facility] GYN Dr. Jennette Kettle (retired), new doc Dr Vickey Sages 03/2022. estrace dropped to 0.5mg  daily, continues progesterone daily.  [facility] through GYN [facility]21 [facility] She had subsequent mammogram through GYN - records requested DEXA - pending 11/23/2022 Lung cancer screening - not eligible  Flu shot yearly COVID vaccine - J&J 05/2019. Rpt 10/2020.  Tdap - 06/2011 - consider rpt next  Shingrix - 08/2019, 11/2019.  Advanced directive - discussed. Doesn't have  this. Would want husband to be HCPOA. Full code. Wouldn't want prolonged life support if terminal condition. Packet previously provided.  Seat belt use discussed  Sunscreen use discussed, no changing moles on skin  Ex smoker - quit 2007. Husband smokes pipe and e-cig  Alcohol - none  Dentist yearly Eye exam - yearly  Bowel - no constipation - takes bowel regimen with miralax regularly  Bladder - new stress incontinence   Caffeine: drinks diet drinks   Lives with husband and 1 dog, 1 cat   Occupation: housewife  Activity: no regular exercise  Diet: good water, fruits/vegetables some     Relevant past medical, surgical, family and social history reviewed and updated as indicated. Interim medical history since our last visit reviewed. Allergies and medications reviewed and updated. Outpatient Medications Prior to Visit  Medication Sig Dispense Refill   buPROPion (WELLBUTRIN XL) 150 MG 24 hr tablet Take 2 tablets (300 mg total) by mouth daily.     busPIRone (BUSPAR) 7.5 MG tablet Take 7.5 mg by mouth 3 (three) times daily.     citalopram (CELEXA) 20 MG tablet Take 1 tablet (20 mg total) by mouth daily.     Cyanocobalamin (VITAMELTS ENERGY VITAMIN B-12) 1500 MCG TBDP Take 1 tablet by mouth every Monday, Wednesday, and Friday.     ferrous sulfate 324 (65 Fe) MG TBEC Take 1 tablet (325 mg total) by mouth 3 (three)  times a week.     methimazole (TAPAZOLE) 5 MG tablet Take 0.5 tablets (2.5 mg total) by mouth as directed. Half a tablet Monday through Saturday, skip Sundays 45 tablet 0   naltrexone (DEPADE) 50 MG tablet Take 50 mg by mouth 2 (two) times daily.     progesterone (PROMETRIUM) 200 MG capsule Take 200 mg by mouth at bedtime.      propranolol ER (INDERAL LA) 120 MG 24 hr capsule Take 120 mg by mouth at bedtime.      ziprasidone (GEODON) 80 MG capsule Take 240 mg by mouth at bedtime.     Cholecalciferol (VITAMIN D3) 125 MCG (5000 UT) CAPS Take 5,000 Units by mouth daily.      estradiol  (ESTRACE) 2 MG tablet Take 2 mg by mouth daily.     lisinopril (ZESTRIL) 5 MG tablet TAKE 1 TABLET (5 MG TOTAL) BY MOUTH DAILY. 90 tablet 0   lithium carbonate (ESKALITH) 450 MG CR tablet Take 450 mg by mouth at bedtime.      pantoprazole (PROTONIX) 40 MG tablet TAKE 1 TABLET BY MOUTH EVERY DAY 90 tablet 0   Cholecalciferol (VITAMIN D3) 125 MCG (5000 UT) CAPS Take 1 capsule (5,000 Units total) by mouth every other day.     estradiol (ESTRACE) 0.5 MG tablet Take 1 tablet (0.5 mg total) by mouth daily.     lithium carbonate (LITHOBID) 300 MG ER tablet Take 1 tablet (300 mg total) by mouth at bedtime.     tretinoin (RETIN-A) 0.05 % cream Apply 1 application topically at bedtime.     Calcium-Magnesium-Zinc 333-133-5 MG TABS Take by mouth. Takes 1 tablet 3 times a week     triamcinolone (KENALOG) 0.1 % SMARTSIG:1 Application Topical 2-3 Times Daily     No facility-administered medications prior to visit.     Per HPI unless specifically indicated in ROS section below Review of Systems  Constitutional:  Negative for activity change, appetite change, chills, fatigue, fever and unexpected weight change.  HENT:  Negative for hearing loss.   Eyes:  Negative for visual disturbance.  Respiratory:  Positive for shortness of breath. Negative for cough, chest tightness and wheezing.   Cardiovascular:  Negative for chest pain, palpitations and leg swelling.  Gastrointestinal:  Negative for abdominal distention, abdominal pain, blood in stool, constipation, diarrhea, nausea and vomiting.  Genitourinary:  Negative for difficulty urinating and hematuria.  Musculoskeletal:  Negative for arthralgias, myalgias and neck pain.  Skin:  Negative for rash.  Neurological:  Positive for headaches. Negative for dizziness, seizures and syncope.  Hematological:  Negative for adenopathy. Does not bruise/bleed easily.  Psychiatric/Behavioral:  Negative for dysphoric mood. The patient is nervous/anxious.     Objective:   BP 126/86   Pulse (!) 50   Temp 98.1 F (36.7 C) (Oral)   Ht 5' 2.5" (1.588 m)   Wt 178 lb 4 oz (80.9 kg)   SpO2 98%   BMI 32.08 kg/m   Wt Readings from Last 3 Encounters:  11/16/22 178 lb 4 oz (80.9 kg)  06/03/22 185 lb 6.4 oz (84.1 kg)  05/03/22 188 lb 4 oz (85.4 kg)      Physical Exam Vitals and nursing note reviewed.  Constitutional:      Appearance: Normal appearance. She is not ill-appearing.  HENT:     Head: Normocephalic and atraumatic.     Right Ear: Tympanic membrane, ear canal and external ear normal. There is no impacted cerumen.     Left  Ear: Tympanic membrane, ear canal and external ear normal. There is no impacted cerumen.     Mouth/Throat:     Mouth: Mucous membranes are moist.     Pharynx: Oropharynx is clear. No oropharyngeal exudate or posterior oropharyngeal erythema.  Eyes:     General:        Right eye: No discharge.        Left eye: No discharge.     Extraocular Movements: Extraocular movements intact.     Conjunctiva/sclera: Conjunctivae normal.     Pupils: Pupils are equal, round, and reactive to light.  Neck:     Thyroid: No thyroid mass or thyromegaly.     Vascular: No carotid bruit.  Cardiovascular:     Rate and Rhythm: Normal rate and regular rhythm.     Pulses: Normal pulses.     Heart sounds: Normal heart sounds. No murmur heard. Pulmonary:     Effort: Pulmonary effort is normal. No respiratory distress.     Breath sounds: Normal breath sounds. No wheezing, rhonchi or rales.  Abdominal:     General: Bowel sounds are normal. There is no distension.     Palpations: Abdomen is soft. There is no mass.     Tenderness: There is no abdominal tenderness. There is no guarding or rebound.     Hernia: No hernia is present.  Musculoskeletal:     Cervical back: Normal range of motion and neck supple. No rigidity.     Right lower leg: No edema.     Left lower leg: No edema.  Lymphadenopathy:     Cervical: No cervical adenopathy.  Skin:     General: Skin is warm and dry.     Findings: Erythema and rash present.          Comments: Erythematous scaly patch to left anterior elbow lateral to [facility] fossa  Neurological:     General: No focal deficit present.     Mental Status: She is alert. Mental status is at baseline.  Psychiatric:        Mood and Affect: Mood normal.        Behavior: Behavior normal.       Results for orders placed or performed in visit on 11/09/22  Lithium level  Result Value Ref Range   Lithium Lvl 1.0 0.6 - 1.2 mmol/L  Vitamin B12  Result Value Ref Range   Vitamin B-12 597 211 - 911 pg/mL  Hemoglobin A1c  Result Value Ref Range   Hgb A1c MFr Bld 5.0 4.6 - 6.5 %  Comprehensive metabolic panel  Result Value Ref Range   Sodium 134 (L) 135 - 145 mEq/L   Potassium 4.5 3.5 - 5.1 mEq/L   Chloride 102 96 - 112 mEq/L   CO2 25 19 - 32 mEq/L   Glucose, Bld 108 (H) 70 - 99 mg/dL   BUN 22 6 - 23 mg/dL   Creatinine, Ser 9.62 0.40 - 1.20 mg/dL   Total Bilirubin 0.4 0.2 - 1.2 mg/dL   Alkaline Phosphatase 57 39 - 117 U/L   AST 13 0 - 37 U/L   ALT 12 0 - 35 U/L   Total Protein 6.5 6.0 - 8.3 g/dL   Albumin 3.9 3.5 - 5.2 g/dL   GFR 95.28 (L) >41.32 mL/min   Calcium 10.7 (H) 8.4 - 10.5 mg/dL  Lipid panel  Result Value Ref Range   Cholesterol 179 0 - 200 mg/dL   Triglycerides 440.1 0.0 - 149.0 mg/dL  HDL 81.30 >39.00 mg/dL   VLDL 82.9 0.0 - 56.2 mg/dL   LDL Cholesterol 75 0 - 99 mg/dL   Total CHOL/HDL Ratio 2    NonHDL 97.89   CBC with Differential/Platelet  Result Value Ref Range   WBC 8.8 4.0 - 10.5 K/uL   RBC 3.97 3.87 - 5.11 Mil/uL   Hemoglobin 12.5 12.0 - 15.0 g/dL   HCT 13.0 86.5 - 78.4 %   MCV 98.9 78.0 - 100.0 fl   MCHC 32.0 30.0 - 36.0 g/dL   RDW 69.6 29.5 - 28.4 %   Platelets 249.0 150.0 - 400.0 K/uL   Neutrophils Relative % 75.3 43.0 - 77.0 %   Lymphocytes Relative 12.1 12.0 - 46.0 %   Monocytes Relative 8.2 3.0 - 12.0 %   Eosinophils Relative 3.6 0.0 - 5.0 %   Basophils Relative 0.8  0.0 - 3.0 %   Neutro Abs 6.7 1.4 - 7.7 K/uL   Lymphs Abs 1.1 0.7 - 4.0 K/uL   Monocytes Absolute 0.7 0.1 - 1.0 K/uL   Eosinophils Absolute 0.3 0.0 - 0.7 K/uL   Basophils Absolute 0.1 0.0 - 0.1 K/uL  Parathyroid hormone, intact (no Ca)  Result Value Ref Range   PTH 74 16 - 77 pg/mL  Microalbumin / creatinine urine ratio  Result Value Ref Range   Microalb, Ur <0.7 0.0 - 1.9 mg/dL   Creatinine,U 13.2 mg/dL   Microalb Creat Ratio 2.5 0.0 - 30.0 mg/g  VITAMIN D 25 Hydroxy (Vit-D Deficiency, Fractures)  Result Value Ref Range   VITD 70.49 30.00 - 100.00 ng/mL    Assessment & Plan:   Problem List Items Addressed This Visit     Health maintenance examination - Primary (Chronic)    Preventative protocols reviewed and updated unless pt declined. Discussed healthy diet and lifestyle.       Multiple thyroid nodules (Chronic)    Appreciate endo care       Advanced directives, counseling/discussion (Chronic)    Advanced directive - discussed. Doesn't have this. Would want husband to be HCPOA. Full code. Wouldn't want prolonged life support if terminal condition. Packet previously provided.       Bipolar disorder (HCC)    Chronic, stable period on current regimen followed by psychiatry.  Recently lithium dose decreased.       HYPERTENSION, BENIGN ESSENTIAL    Chronic, stable on current regimen - continue.  Tolerating low-dose lisinopril well.      Relevant Medications   lisinopril (ZESTRIL) 5 MG tablet   GERD (gastroesophageal reflux disease)    Chronic, stable on daily PPI       Relevant Medications   pantoprazole (PROTONIX) 40 MG tablet   PSORIASIS NEC    H/o this, has remotely seen dermatology.       Drug-induced tremor    Li induced. Dose recently reduced. Continue propranolol, dose limited by bradycardia.       Impaired fasting glucose    Recent A1c normal, encouraged limiting added sugars in diet      Renal insufficiency    Deteriorated kidney function noted  today.  Question lithium related.  Reassess control at follow-up visit.  Encouraged continued good hydration      Vitamin B12 deficiency    Stable time on Monday Wednesday Friday replacement.      Hypercalcemia due to lithium    Again calcium mildly elevated, parathyroid levels high normal. Question primary versus secondary hyperparathyroidism Seeing endocrinology, pending DEXA scan.  Graves disease    Appreciate Endo care.  Continues low-dose methimazole.      Urinary, incontinence, stress female    Mild, discussed Kegel exercises. She can touch base with GYN about this as well.      Hyperparathyroidism due to lithium therapy (HCC)   Skin rash    Possible tinea corporis versus eczema versus psoriasis although not in typical location. Recommend trial over-the-counter Lotrimin, update if ineffective to consider triamcinolone prescription.      Other Visit Diagnoses     Encounter for immunization       Relevant Orders   Flu vaccine trivalent PF, 6mos and older(Flulaval,Afluria,Fluarix,Fluzone) (Completed)        Meds ordered this encounter  Medications   lisinopril (ZESTRIL) 5 MG tablet    Sig: Take 1 tablet (5 mg total) by mouth daily.    Dispense:  90 tablet    Refill:  4   pantoprazole (PROTONIX) 40 MG tablet    Sig: Take 1 tablet (40 mg total) by mouth daily.    Dispense:  90 tablet    Refill:  4    Orders Placed This Encounter  Procedures   Flu vaccine trivalent PF, 6mos and older(Flulaval,Afluria,Fluarix,Fluzone)    Patient Instructions  Flu shot today  We will request latest mammogram from GYN Dr Vickey Sages at [facility] For stress urine incontinence issues, try kegel exercises - handout provided today Work on advanced directive and bring [place] copy when complete  Good to see you today Return as needed or in 6 months for follow up visit and 1 year for next physical.  For skin rash, try clotrimazole (lotrimin) over the counter antifungal. Let  us know if not better with this.   Follow up plan: Return in about 6 months (around 05/16/2023) for follow up visit.  Eustaquio Boyden, MD

## 2022-11-16 NOTE — Patient Instructions (Addendum)
Flu shot today  We will request latest mammogram from GYN Dr Vickey Sages at [facility] For stress urine incontinence issues, try kegel exercises - handout provided today Work on advanced directive and bring [place] copy when complete  Good to see you today Return as needed or in 6 months for follow up visit and 1 year for next physical.  For skin rash, try clotrimazole (lotrimin) over the counter antifungal. Let us know if not better with this.

## 2022-11-16 NOTE — Assessment & Plan Note (Signed)
Chronic, stable period on current regimen followed by psychiatry.  Recently lithium dose decreased.

## 2022-11-16 NOTE — Assessment & Plan Note (Signed)
Deteriorated kidney function noted today.  Question lithium related.  Reassess control at follow-up visit.  Encouraged continued good hydration

## 2022-11-16 NOTE — Assessment & Plan Note (Signed)
Appreciate endo care.  

## 2022-11-16 NOTE — Assessment & Plan Note (Addendum)
Li induced. Dose recently reduced. Continue propranolol, dose limited by bradycardia.

## 2022-11-16 NOTE — Assessment & Plan Note (Signed)
Chronic, stable on daily PPI.  

## 2022-11-16 NOTE — Assessment & Plan Note (Signed)
Mild, discussed Kegel exercises. She can touch base with GYN about this as well.

## 2022-11-16 NOTE — Assessment & Plan Note (Signed)
Again calcium mildly elevated, parathyroid levels high normal. Question primary versus secondary hyperparathyroidism Seeing endocrinology, pending DEXA scan.

## 2022-11-16 NOTE — Assessment & Plan Note (Addendum)
Chronic, stable on current regimen - continue.  Tolerating low-dose lisinopril well.

## 2022-11-16 NOTE — Assessment & Plan Note (Signed)
Recent A1c normal, encouraged limiting added sugars in diet

## 2022-11-16 NOTE — Assessment & Plan Note (Signed)
Appreciate Endo care.  Continues low-dose methimazole.

## 2022-11-16 NOTE — Assessment & Plan Note (Signed)
Stable time on Monday Wednesday Friday replacement.

## 2022-11-16 NOTE — Assessment & Plan Note (Signed)
Advanced directive - discussed. Doesn't have this. Would want husband to be HCPOA. Full code. Wouldn't want prolonged life support if terminal condition. Packet previously provided.

## 2022-11-16 NOTE — Assessment & Plan Note (Signed)
H/o this, has remotely seen dermatology.

## 2022-11-16 NOTE — Assessment & Plan Note (Signed)
Possible tinea corporis versus eczema versus psoriasis although not in typical location. Recommend trial over-the-counter Lotrimin, update if ineffective to consider triamcinolone prescription.

## 2022-11-22 ENCOUNTER — Encounter: Payer: Self-pay | Admitting: Obstetrics and Gynecology

## 2022-11-23 ENCOUNTER — Ambulatory Visit
Admission: RE | Admit: 2022-11-23 | Discharge: 2022-11-23 | Disposition: A | Discharge status: Home / Self Care | Payer: 59 | Source: Ambulatory Visit | Attending: Family Medicine | Admitting: Family Medicine

## 2022-11-23 DIAGNOSIS — E2839 Other primary ovarian failure: Secondary | ICD-10-CM

## 2022-11-23 DIAGNOSIS — E211 Secondary hyperparathyroidism, not elsewhere classified: Secondary | ICD-10-CM

## 2022-11-23 DIAGNOSIS — T43595A Adverse effect of other antipsychotics and neuroleptics, initial encounter: Secondary | ICD-10-CM

## 2022-11-29 ENCOUNTER — Ambulatory Visit (INDEPENDENT_AMBULATORY_CARE_PROVIDER_SITE_OTHER): Payer: 59 | Admitting: Internal Medicine

## 2022-11-29 ENCOUNTER — Encounter: Payer: Self-pay | Admitting: Internal Medicine

## 2022-11-29 VITALS — BP 122/70 | HR 55 | Ht 62.5 in | Wt 175.0 lb

## 2022-11-29 DIAGNOSIS — E0500 Thyrotoxicosis with diffuse goiter without thyrotoxic crisis or storm: Secondary | ICD-10-CM

## 2022-11-29 DIAGNOSIS — E213 Hyperparathyroidism, unspecified: Secondary | ICD-10-CM

## 2022-11-29 DIAGNOSIS — E042 Nontoxic multinodular goiter: Secondary | ICD-10-CM | POA: Diagnosis not present

## 2022-11-29 DIAGNOSIS — T56891A Toxic effect of other metals, accidental (unintentional), initial encounter: Secondary | ICD-10-CM

## 2022-11-29 LAB — T3, FREE: T3, Free: 3.2 pg/mL (ref 2.3–4.2)

## 2022-11-29 LAB — T4, FREE: Free T4: 0.55 ng/dL — ABNORMAL LOW (ref 0.60–1.60)

## 2022-11-29 LAB — TSH: TSH: 1.28 u[IU]/mL (ref 0.35–5.50)

## 2022-11-29 MED ORDER — METHIMAZOLE 5 MG PO TABS: 2.5000 mg | ORAL_TABLET | ORAL | Status: DC

## 2022-11-29 NOTE — Progress Notes (Addendum)
Patient ID: Katelyn Price, female   DOB: 15-Jan-1960, 62 y.o.   MRN: [id number]   HPI  Katelyn Price is a 62 y.o.-year-old female, presenting for f/u for Graves ds. and thyroid nodules. Last visit 1 year ago.  Interim history: She denies palpitations but has tremors, which are longstanding, due to lithium therapy.  She is on Inderal. Since last visit she was found to have hyperparathyroidism, possibly due to lithium therapy.  No constipation, kidney stones. She tells me she recently saw her psychiatrist, who recommended against stopping lithium, as this is working well for her. She started Naltrexone 50 mg 2x a day for weight loss - by psychiatry.  She lost 10 pounds since last visit.  Reviewed history: Patient was diagnosed with thyrotoxicosis in summer 2017 and was confirmed as Graves' disease on an uptake and scan from 10/2015.   Reviewed her treatment history: She was initially started on methimazole 10 mg twice a day, then decreased to 5 mg twice a day in 02/2016 after which TFTs normalized.  We were able to decrease the dose further to 5 mg daily.  We have tried an even lower dose but her TSH became suppressed on less than 5 mg methimazole daily..  In 01/2017, we increase the dose of methimazole to 5 mg twice a day of methimazole  In 11/2017, we were able to decrease the dose to 5 mg once a day.  In 03/2018 we were able to decrease the dose to 2.5 mg once a day.  In 10/2018, we continued methimazole 2.5 mg once a day  In 04/2019, we continued the same dose.  In 06/2020, we stopped MMI.  In 06/2021, we restarted MMI 2.5 mg daily.    In 07/2022, we decreased MMI 2.5 mg tablet every other day.  Reviewed her TFTs: Lab Results  Component Value Date   TSH 2.22 07/21/2022   TSH 1.65 06/08/2022   TSH 2.46 02/22/2022   TSH 2.41 12/23/2021   TSH 1.19 11/04/2021   TSH 0.76 09/22/2021   TSH 0.14 (L) 07/23/2021   TSH 0.16 (L) 06/11/2021   TSH 0.67 08/04/2020   TSH 1.55  06/11/2020   FREET4 0.48 (L) 07/21/2022   FREET4 0.50 (L) 06/08/2022   FREET4 0.51 (L) 02/22/2022   FREET4 0.39 (L) 12/23/2021   FREET4 0.48 (L) 11/04/2021   FREET4 0.45 (L) 09/22/2021   FREET4 0.55 (L) 07/23/2021   FREET4 0.58 (L) 06/11/2021   FREET4 0.48 (L) 08/04/2020   FREET4 0.40 (L) 06/11/2020   Lab Results  Component Value Date   T3FREE 2.8 07/21/2022   T3FREE 2.7 06/08/2022   T3FREE 3.3 02/22/2022   T3FREE 3.1 12/23/2021   T3FREE 3.0 09/22/2021   T3FREE 2.7 07/23/2021   T3FREE 3.5 06/11/2021   T3FREE 3.2 08/04/2020   T3FREE 2.8 06/11/2020   T3FREE 3.3 12/12/2019   Her Graves' antibodies were elevated: Lab Results  Component Value Date   TSI 368 (H) 06/11/2020   TSI 411 (H) 07/09/2018   TSI 499 (H) 05/31/2017   TSI 442 (H) 11/14/2016   TSI 630 (H) 10/14/2015   Component     Latest Ref Rng & Units 08/25/2015  Thyrotropin Receptor Ab     <=16.0 % 41.4 (H)   Reviewed history: Thyroid ultrasound on 09/04/2015: Right thyroid lobe: 4.1 cm x 1.7 cm x 1.9 cm. Relatively homogeneous appearance of right thyroid tissue.   Right thyroid nodule #1 measures 0.7 cm x 0.7 cm x 0.7  cm mixed cystic and solid composition (1), with hypoechoic echogenicity (2),  taller than wide shape (3) Left thyroid lobe: 5.1 cm x 1.9 cm x 2.4 cm. Relatively homogeneous appearance of the left thyroid.   *Inferior left nodule #1 measures 1.5 cm x 1.1 cm x 1.3 cm Nearly completely solid composition (2), with isoechoic  echogenicity (1).   There are 3 additional separate nodules all measuring less than 5 mm with characteristics compatible with colloid cysts. Isthmus Thickness: 0.4 cm.  No nodules visualized. Lymphadenopathy: None visualized.  Thyroid Uptake and scan (10/22/2015) >> Graves' disease.: 24 hour radioactive iodine uptake 36.9%.Diffuse radiotracer uptake identified within both lobes of the thyroid gland.    Thyroid U/S (10/26/2018): Stable, small nodules: Parenchymal Echotexture:  Moderately heterogenous Isthmus: Normal in size measuring 0.4 cm in diameter Right lobe: Normal in size measuring 5.2 x 2.1 x 2.2 cm, unchanged previously, 4.1 x 1.7 x 1.9 cm Left lobe: Normal in size measuring 5.4 x 2.0 x 2.4 cm, unchanged, previously, 5.1 x 1.9 x 2.4 cm _________________________________________________________   Previously noted approximately 0.7 cm minimally complex cyst within the inferior pole the right lobe of the thyroid is not demonstrated on the present examination.  __________________________________________________   The previously noted approximately 1.5 x 1.3 x 1.1 cm isoechoic nodule within the mid, inferior aspect the left lobe of the thyroid is not seen on the present examination and thus may have represented a pseudonodule.   There is an approximately 1.0 x 1.0 x 0.8 cm isoechoic ill-defined nodule/pseudonodule within the mid aspect the left lobe of the thyroid (labeled 1) was not definitely seen on the 2017 examination however does not meet imaging criteria to recommend percutaneous sampling or continued dedicated follow-up.   There is an approximately 1.0 x 0.9 x 0.9 cm partially cystic, partially solid nodule within the inferior pole the left lobe of the thyroid (labeled 2), which was not definitely seen on the 2017 examination, however does not meet imaging criteria to recommend percutaneous sampling or continued dedicated follow-up.   IMPRESSION: 1. Findings suggestive of multinodular goiter. 2. No worrisome new or enlarging thyroid nodules. 3. None of the discretely measured thyroid nodules meet imaging criteria to recommend percutaneous sampling or continued dedicated follow-up.   She is on propranolol 120 mg in a.m. for tremors.  On lithium for bipolar disorder.  Pt denies: - feeling nodules in neck - hoarseness - dysphagia - choking  Pt does have a FH of thyroid ds on father's side of the family. No FH of thyroid cancer. No h/o  radiation tx to head or neck. No herbal supplements. No Biotin use. No recent steroids use.   Hypercalcemia:  Pt 2 instances of elevated total calcium and also hyperparathyroidism. I reviewed pt's pertinent labs: Lab Results  Component Value Date   PTH 74 11/09/2022   PTH 57 06/08/2022   PTH 87 (H) 05/03/2022   PTH 75 (H) 08/11/2014   CALCIUM 10.7 (H) 11/09/2022   CALCIUM 9.8 05/03/2022   CALCIUM 10.6 (H) 11/04/2021   CALCIUM 10.2 08/12/2020   CALCIUM 10.5 (H) 09/26/2019   CALCIUM 10.1 08/16/2019   CALCIUM 9.9 02/15/2019   CALCIUM 10.1 08/22/2018   CALCIUM 10.2 08/15/2018   CALCIUM 10.1 08/09/2018   No fragility fractures.  No h/o kidney stones.  No h/o CKD. Last BUN/Cr: Lab Results  Component Value Date   BUN 22 11/09/2022   BUN 21 05/03/2022   CREATININE 1.18 11/09/2022   CREATININE 1.04 05/03/2022  Pt is not on HCTZ.  + h/o vitamin D insufficiency. Reviewed vit D levels: Lab Results  Component Value Date   VD25OH 70.49 11/09/2022   VD25OH 89.62 06/08/2022   VD25OH 82.82 11/04/2021   VD25OH 86.55 08/12/2020   VD25OH 57.30 08/16/2019   VD25OH 56.99 08/11/2014   VD25OH 74 06/14/2010   VD25OH 21 (L) 05/04/2009   Pt was on calcium 333  mg 3x a week, and takes 5000 units vitamin D daily.  At last visit I advised her to take the vitamin D every other day and stop the calcium supplement. She did.  Reviewed most recent bone density scan results: 11/23/2022 Lumbar spine L1-L4 Femoral neck (FN) 33% distal radius Ultra distal radius  T-score +1.1 RFN: n/a LFN: -0.3 -1.6 -1.1   Pt does not have a FH of hypercalcemia, pituitary tumors, thyroid cancer, or osteoporosis.   Pt. also has a history of GERD, psoriasis, bipolar dis., HTN, B12 deficiency.  + History of vaginal spotting-seeing OB/GYN (Dr. Lloyd Huger).  On HRT. She had right TKR in 09/2019 >> she felt  much better after the sx.  ROS: + see HPI  I reviewed pt's medications, allergies, PMH, social hx, family hx,  and changes were documented in the history of present illness. Otherwise, unchanged from my initial visit note.  Past Medical History:  Diagnosis Date   Abnormal involuntary movements(781.0)    Acne    Allergic rhinitis    Arthritis    Asthma    Bipolar disorder, unspecified (HCC)    psych - Dr. Misty Stanley   Common migraine with intractable migraine 10/09/2018   Depression    Diaphragmatic hernia without mention of obstruction or gangrene    GERD (gastroesophageal reflux disease)    03/18/02, EGD, [facility] Gerd,gastritis, dilated   History of MRI of brain and brain stem 06/01/03   [facility]   Hypertension    Other psoriasis    Tremor of both hands 10/09/2018   Unspecified sinusitis (chronic)    Past Surgical History:  Procedure Laterality Date   COLONOSCOPY  03/18/02   internal hemorrhoids   COLONOSCOPY  06/2014   diverticulosis, rpt 10 yrs ([facility])   ESOPHAGOGASTRODUODENOSCOPY  1/99   [facility]; HH,GERD   ESOPHAGOGASTRODUODENOSCOPY  03/18/02   HH,GERD,Gastritis; Dilated   LAPAROSCOPIC ESOPHAGOGASTRIC FUNDOPLASTY  08/28/02   [name]   TOTAL HIP ARTHROPLASTY Right 10/04/2019   Procedure: RIGHT TOTAL HIP ARTHROPLASTY ANTERIOR APPROACH;  Surgeon: Kathryne Hitch, MD;  Location: [facility];  Service: Orthopedics;  Laterality: Right;   Social History   Social History   Marital status: Married    Spouse name: N/A   Number of children: 0   Occupational History   Housewife    Social History Main Topics   Smoking status: Former Smoker    Packs/day: 1.00    Years: 20.00    Types: Cigarettes    Quit date: 01/10/1998   Smokeless tobacco: Never Used   Alcohol use No   Drug use: No   Social History Narrative   Caffeine: drinks diet drinks   Lives with husband and 1 dog, 1 cat   Occupation: housewife   Activity: no regular exercise, doesn't feel safe to walk outside at home, could walk on mother's treadmill   Diet: good water, fruits/vegetables some    Current Outpatient Medications on File  Prior to Visit  Medication Sig Dispense Refill   buPROPion (WELLBUTRIN XL) 150 MG 24 hr tablet Take 2 tablets (300 mg total) by mouth daily.  busPIRone (BUSPAR) 7.5 MG tablet Take 7.5 mg by mouth 3 (three) times daily.     Cholecalciferol (VITAMIN D3) 125 MCG (5000 UT) CAPS Take 1 capsule (5,000 Units total) by mouth every other day.     citalopram (CELEXA) 20 MG tablet Take 1 tablet (20 mg total) by mouth daily.     Cyanocobalamin (VITAMELTS ENERGY VITAMIN B-12) 1500 MCG TBDP Take 1 tablet by mouth every Monday, Wednesday, and Friday.     estradiol (ESTRACE) 0.5 MG tablet Take 1 tablet (0.5 mg total) by mouth daily.     ferrous sulfate 324 (65 Fe) MG TBEC Take 1 tablet (325 mg total) by mouth 3 (three) times a week.     lisinopril (ZESTRIL) 5 MG tablet Take 1 tablet (5 mg total) by mouth daily. 90 tablet 4   lithium carbonate (LITHOBID) 300 MG ER tablet Take 1 tablet (300 mg total) by mouth at bedtime.     methimazole (TAPAZOLE) 5 MG tablet Take 0.5 tablets (2.5 mg total) by mouth as directed. Half a tablet Monday through Saturday, skip Sundays 45 tablet 0   naltrexone (DEPADE) 50 MG tablet Take 50 mg by mouth 2 (two) times daily.     pantoprazole (PROTONIX) 40 MG tablet Take 1 tablet (40 mg total) by mouth daily. 90 tablet 4   progesterone (PROMETRIUM) 200 MG capsule Take 200 mg by mouth at bedtime.      propranolol ER (INDERAL LA) 120 MG 24 hr capsule Take 120 mg by mouth at bedtime.      tretinoin (RETIN-A) 0.05 % cream Apply 1 application topically at bedtime.     ziprasidone (GEODON) 80 MG capsule Take 240 mg by mouth at bedtime.     No current facility-administered medications on file prior to visit.   Allergies  Allergen Reactions   Amlodipine Swelling    Ankle swelling   Hydrocodone-Acetaminophen     Pt says it does not work for her   Minocycline Hcl Itching   Family History  Problem Relation Age of Onset   Arthritis Mother        fibromyalgia   Alcohol abuse Father     Emphysema Brother        smoker   COPD Brother        smoker, continues.   Arthritis Sister        fibromyalgia   Cancer Neg Hx    Coronary artery disease Neg Hx    Stroke Neg Hx    Diabetes Neg Hx    PE: BP 122/70   Pulse (!) 55   Ht 5' 2.5" (1.588 m)   Wt 175 lb (79.4 kg)   SpO2 98%   BMI 31.50 kg/m  Wt Readings from Last 3 Encounters:  11/29/22 175 lb (79.4 kg)  11/16/22 178 lb 4 oz (80.9 kg)  06/03/22 185 lb 6.4 oz (84.1 kg)   Constitutional: overweight, in NAD Eyes: EOMI, no exophthalmos ENT: no thyromegaly, no cervical lymphadenopathy Cardiovascular: RRR, No MRG Respiratory: CTA B Musculoskeletal: no deformities Skin: no rashes Neurological: +++ tremor with outstretched hands   ASSESSMENT: 1.  Graves' disease  2. Thyroid nodules  3.  Hyperparathyroidism  PLAN:  1. Patient with history of thyrotoxicosis diagnosed when she presented with thyrotoxic symptoms: tremors  (however, these are likely due to lithium), heat intolerance, palpitations, shortness of breath.  She started methimazole and we were able to decrease the dose gradually and stopped the medication completely in 06/2020.  TFTs were normal  for a period of time, but then TSH became suppressed so he started back on methimazole, currently on 2.5 mg every day, dose decreased 12/2021.  TFTs were normal on this dose in 02/2022. -She continues to have tremors, which are chronic for her and for which she is on Inderal.  She also has some heat intolerance.  Before last visit she gained 10 pounds but she was able to lose almost all of them since then -actually lost 17 lbs per her scale at home (10 lbs per our scale) - on Naltrexone started by her psychiatrist. -Her thigh antibodies were still elevated but improved at last check -At today's visit we will repeat her TFTs and change the methimazole dose accordingly -She continues on beta-blocker for tremors, but we discussed that this can also help with heart rate in  the setting of thyrotoxicosis -I will see her back in 6 months  2. Thyroid nodules -No neck compression symptoms -She has a history of a 1 cm by cell-or slightly hypo-- echoic thyroid nodule, with internal blood flow on her ultrasound from 08/2015.  The nodule appeared warm on the uptake and scan, which can either indicate an inflammatory nodule (pseudonodule) or a normal functioning nodule.  Since the nodule was seen in the context of thyrotoxicosis, we decided to wait until she was euthyroid to repeat the ultrasound then.  We repeated a thyroid ultrasound in 10/2018 and this showed that 2 of her thyroid nodules were not visible anymore and 2 nodules are very small and not worrisome. -Will continue to follow her clinically, no need for further ultrasounds  3.  Hyperparathyroidism -Possibly lithium induced per PCP evaluation -Calcium levels were reviewed and they are are in the normal or slightly above target.  Latest calcium level was 10.7 on 11/09/2022 -At last visit she was taking 5000 units vitamin D daily and we reduced it to every other day.  She was also taking calcium supplements, 333 mg 3 times a week which I advised her to stop.  I advised her not to reduce calcium in diet -She has no apparent complications from hypercalcemia: No history of nephrolithiasis, osteoporosis, fractures.  Also, no abdominal pain, depression, bone pain. -At last visit, we discussed possible consequences of hyperparathyroidism including osteoporosis and nephrolithiasis -We also discussed about establishing whether her hyperparathyroidism could be primary (in the setting of familial hypercalcemic hypercalciuria or parathyroid adenoma), or secondary (to conditions like medications-lithium in her case, vitamin D deficiency, calcium malabsorption, hypercalciuria, renal insufficiency etc.  At last visit, we checked the following tests: Component     Latest Ref Rng 06/08/2022  Vitamin D 1, 25 (OH) Total     18 - 72  pg/mL 53   Vitamin D3 1, 25 (OH)     pg/mL 53   Vitamin D2 1, 25 (OH)     pg/mL <8   PTH, Intact     16 - 77 pg/mL 57   Magnesium     1.5 - 2.5 mg/dL 1.7   Calcium Ionized     4.7 - 5.5 mg/dL 5.6 (H)   VITD     13.08 - 100.00 ng/mL 89.62   Phosphorus     2.3 - 4.6 mg/dL 3.0   -Her PTH level was not suppressed in the setting of slightly elevated ionized calcium.  Her vitamin D level was at the upper limit of the target range but her repeat level was lower 3 weeks ago, at 70.  She also had a repeat  parathyroid hormone recently and this was 74, higher, for total calcium of 10.7. -At today's visit, we will proceed with a 24-hour urine for calcium.  We discussed about how to perform the collection, going over every step, and also giving her a printout with instructions.  After the results are back, we may need a technetium sestamibi parathyroid scan. - if she has lithium induced hyperparathyroidism, coming off lithium may improve the calcium levels, but since they are at the upper limit of normal or slightly above, and since she has been on lithium for years, stopping lithium may be more deleterious for her.  Her psychiatrist recommended against this. -While mild primary hyperparathyroidism could be managed expectantly, if her hypercalcemia becomes more accentuated, criteria for parathyroid surgery are:  Increased calcium by more than 1 mg/dL above the upper limit of normal  Kidney ds.  Osteoporosis (or vertebral fracture) Age <61 years old Newer criteria (2013): High UCa >400 mg/d and increased stone risk by biochemical stone risk analysis Presence of nephrolithiasis or nephrocalcinosis Pt's preference!  -We reviewed together her most recent bone density scan from 11/25/2022.  This showed normal T-scores at the level of the spine and left femoral neck (right femoral neck could not be analyzed), but low T-scores (in the osteopenia range at the 33% distal radius and ultra distal radius), with  the low score at the 33% distal radius, the main site of action and parathyroid hormone -We discussed about doing weightbearing exercises as we discussed that osteoporosis medications do not have great penetrance at the level of the distal radius. Will wait for the above investigations to see if we need a surgical approach for her hyperparathyroidism  - Total time spent for the visit: 40 min, in precharting, postcharting, reviewing Dr. Sharen Hones' last note, obtaining medical information from the chart and from the pt, reviewing her  previous labs, evaluations, and treatments, reviewing her symptoms, counseling her about her endocrine conditions (please see the discussed topics above), and developing a plan to further in the gait and treat them; she had a number of questions which I addressed.  Component     Latest Ref Rng 11/29/2022  Triiodothyronine,Free,Serum     2.3 - 4.2 pg/mL 3.2   T4,Free(Direct)     0.60 - 1.60 ng/dL 1.61 (L)   TSH     0.96 - 5.50 uIU/mL 1.28   TFTs are at goal.  Will continue the same dose of methimazole for now.  Component     Latest Ref Rng 12/05/2022  Sodium     135 - 146 mmol/L 133 (L)   Potassium     3.5 - 5.3 mmol/L 4.0   Chloride     98 - 110 mmol/L 101   CO2     20 - 32 mmol/L 23   Glucose     65 - 99 mg/dL 045 (H)   BUN     7 - 25 mg/dL 24   Creatinine     4.09 - 1.05 mg/dL 8.11 (H)   Calcium     8.6 - 10.4 mg/dL 91.4 (H)   BUN/Creatinine Ratio     6 - 22 (calc) 23 (H)   Creatinine, 24H Ur     0.50 - 2.15 g/24 h 0.69   Calcium, 24H Urine     mg/24 h 72    FECa = 0.0105  Urine calcium is not elevated, as expected in hyperparathyroidism (including lithium induced).  I wonder if this may be related to  the vitamin D closer to the upper limit of the target range.  However, her calcium level is only slightly elevated. In the presence of only mildly elevated calcium, for now, I would suggest to follow her expectantly.  Carlus Pavlov, MD  PhD [facility]

## 2022-11-29 NOTE — Patient Instructions (Addendum)
Please continue Methimazole 2.5 mg every other daily.  Continue vitamin D 5000 units every other day.  Please stop at the lab.  Please collect a 24h urine sample.  Patient information (Up-to-Date): Collection of a 24-hour urine specimen  - You should collect every drop of urine during each 24-hour period. It does not matter how much or little urine is passed each time, as long as every drop is collected. - Begin the urine collection in the morning after you wake up, after you have emptied your bladder for the first time. - Urinate (empty the bladder) for the first time and flush it down the toilet. Note the exact time (eg, 6:15 AM). You will begin the urine collection at this time. - Collect every drop of urine during the day and night in an empty collection bottle. Store the bottle at room temperature or in the refrigerator. - If you need to have a bowel movement, any urine passed with the bowel movement should be collected. Try not to include feces with the urine collection. If feces does get mixed in, do not try to remove the feces from the urine collection bottle. - Finish by collecting the first urine passed the next morning, adding it to the collection bottle. This should be within ten minutes before or after the time of the first morning void on the first day (which was flushed). In this example, you would try to void between 6:05 and 6:25 on the second day. - If you need to urinate one hour before the final collection time, drink a full glass of water so that you can void again at the appropriate time. If you have to urinate 20 minutes before, try to hold the urine until the proper time. - Please note the exact time of the final collection, even if it is not the same time as when collection began on day 1. - The bottle(s) may be kept at room temperature for a day or two, but should be kept cool or refrigerated for longer periods of time.  Please stop at the lab when you bring the urine  collection in.  Please come back for a follow-up appointment in 6 months.

## 2022-12-03 ENCOUNTER — Encounter: Payer: Self-pay | Admitting: Family Medicine

## 2022-12-03 DIAGNOSIS — M8580 Other specified disorders of bone density and structure, unspecified site: Secondary | ICD-10-CM | POA: Insufficient documentation

## 2022-12-05 ENCOUNTER — Other Ambulatory Visit: Payer: 59

## 2022-12-05 ENCOUNTER — Other Ambulatory Visit: Payer: Self-pay

## 2022-12-05 DIAGNOSIS — E213 Hyperparathyroidism, unspecified: Secondary | ICD-10-CM

## 2022-12-06 LAB — BASIC METABOLIC PANEL
BUN/Creatinine Ratio: 23 (calc) — ABNORMAL HIGH (ref 6–22)
BUN: 24 mg/dL (ref 7–25)
CO2: 23 mmol/L (ref 20–32)
Calcium: 10.5 mg/dL — ABNORMAL HIGH (ref 8.6–10.4)
Chloride: 101 mmol/L (ref 98–110)
Creat: 1.06 mg/dL — ABNORMAL HIGH (ref 0.50–1.05)
Glucose, Bld: 108 mg/dL — ABNORMAL HIGH (ref 65–99)
Potassium: 4 mmol/L (ref 3.5–5.3)
Sodium: 133 mmol/L — ABNORMAL LOW (ref 135–146)

## 2022-12-06 LAB — CREATININE, URINE, 24 HOUR: Creatinine, 24H Ur: 0.69 g/(24.h) (ref 0.50–2.15)

## 2022-12-06 LAB — CALCIUM, URINE, 24 HOUR: Calcium, 24H Urine: 72 mg/(24.h)

## 2023-04-06 ENCOUNTER — Other Ambulatory Visit: Payer: Self-pay | Admitting: Internal Medicine

## 2023-06-19 ENCOUNTER — Ambulatory Visit (INDEPENDENT_AMBULATORY_CARE_PROVIDER_SITE_OTHER): Payer: 59 | Admitting: Internal Medicine

## 2023-06-19 ENCOUNTER — Encounter: Payer: Self-pay | Admitting: Internal Medicine

## 2023-06-19 VITALS — BP 128/60 | HR 56 | Ht 62.52 in | Wt 174.0 lb

## 2023-06-19 DIAGNOSIS — E213 Hyperparathyroidism, unspecified: Secondary | ICD-10-CM | POA: Diagnosis not present

## 2023-06-19 DIAGNOSIS — E0500 Thyrotoxicosis with diffuse goiter without thyrotoxic crisis or storm: Secondary | ICD-10-CM

## 2023-06-19 DIAGNOSIS — T56891A Toxic effect of other metals, accidental (unintentional), initial encounter: Secondary | ICD-10-CM

## 2023-06-19 DIAGNOSIS — E042 Nontoxic multinodular goiter: Secondary | ICD-10-CM

## 2023-06-19 NOTE — Patient Instructions (Signed)
 Please continue Methimazole  2.5 mg every other daily.  Continue vitamin D  5000 units every other day.  Please stop at the lab.  Please come back for a follow-up appointment in 1 year.

## 2023-06-19 NOTE — Progress Notes (Signed)
 Patient ID: Katelyn Price, female   DOB: 1960-10-02, 63 y.o.   MRN: [id number]   HPI  Katelyn Price is a 63 y.o.-year-old female, presenting for f/u for Graves ds. and thyroid  nodules. Last visit 1 year ago.  Interim history: She denies palpitations but has tremors, which are longstanding, due to lithium  therapy.  She is on Inderal . She has hyperparathyroidism, possibly due to lithium  therapy.  No constipation, kidney stones. Her psychiatrist recommended against stopping lithium , as this is working well for her. She started Naltrexone 50 mg 2x a day for weight loss - by psychiatry.  She lost 10 pounds before last visit. Weight stable now. She has hot flushes. Her Estradiol dose reduced recently.  Reviewed history: Patient was diagnosed with thyrotoxicosis in summer 2017 and was confirmed as Graves' disease on an uptake and scan from 10/2015.   Reviewed her treatment history: She was initially started on methimazole  10 mg twice a day, then decreased to 5 mg twice a day in 02/2016 after which TFTs normalized.  We were able to decrease the dose further to 5 mg daily.  We have tried an even lower dose but her TSH became suppressed on less than 5 mg methimazole  daily..  In 01/2017, we increase the dose of methimazole  to 5 mg twice a day of methimazole   In 11/2017, we were able to decrease the dose to 5 mg once a day.  In 03/2018 we were able to decrease the dose to 2.5 mg once a day.  In 10/2018, we continued methimazole  2.5 mg once a day  In 04/2019, we continued the same dose.  In 06/2020, we stopped MMI.  In 06/2021, we restarted MMI 2.5 mg daily.    In 07/2022, we decreased MMI 2.5 mg tablet every other day.  Reviewed her TFTs: Lab Results  Component Value Date   TSH 1.28 11/29/2022   TSH 2.22 07/21/2022   TSH 1.65 06/08/2022   TSH 2.46 02/22/2022   TSH 2.41 12/23/2021   TSH 1.19 11/04/2021   TSH 0.76 09/22/2021   TSH 0.14 (L) 07/23/2021   TSH 0.16 (L) 06/11/2021   TSH  0.67 08/04/2020   FREET4 0.55 (L) 11/29/2022   FREET4 0.48 (L) 07/21/2022   FREET4 0.50 (L) 06/08/2022   FREET4 0.51 (L) 02/22/2022   FREET4 0.39 (L) 12/23/2021   FREET4 0.48 (L) 11/04/2021   FREET4 0.45 (L) 09/22/2021   FREET4 0.55 (L) 07/23/2021   FREET4 0.58 (L) 06/11/2021   FREET4 0.48 (L) 08/04/2020   Lab Results  Component Value Date   T3FREE 3.2 11/29/2022   T3FREE 2.8 07/21/2022   T3FREE 2.7 06/08/2022   T3FREE 3.3 02/22/2022   T3FREE 3.1 12/23/2021   T3FREE 3.0 09/22/2021   T3FREE 2.7 07/23/2021   T3FREE 3.5 06/11/2021   T3FREE 3.2 08/04/2020   T3FREE 2.8 06/11/2020   Her Graves' antibodies were elevated: Lab Results  Component Value Date   TSI 368 (H) 06/11/2020   TSI 411 (H) 07/09/2018   TSI 499 (H) 05/31/2017   TSI 442 (H) 11/14/2016   TSI 630 (H) 10/14/2015   Component     Latest Ref Rng & Units 08/25/2015  Thyrotropin Receptor Ab     <=16.0 % 41.4 (H)   Reviewed history: Thyroid  ultrasound on 09/04/2015: Right thyroid  lobe: 4.1 cm x 1.7 cm x 1.9 cm. Relatively homogeneous appearance of right thyroid  tissue.   Right thyroid  nodule #1 measures 0.7 cm x 0.7 cm x 0.7 cm  mixed cystic and solid composition (1), with hypoechoic echogenicity (2),  taller than wide shape (3) Left thyroid  lobe: 5.1 cm x 1.9 cm x 2.4 cm. Relatively homogeneous appearance of the left thyroid .   *Inferior left nodule #1 measures 1.5 cm x 1.1 cm x 1.3 cm Nearly completely solid composition (2), with isoechoic  echogenicity (1).   There are 3 additional separate nodules all measuring less than 5 mm with characteristics compatible with colloid cysts. Isthmus Thickness: 0.4 cm.  No nodules visualized. Lymphadenopathy: None visualized.  Thyroid  Uptake and scan (10/22/2015) >> Graves' disease.: 24 hour radioactive iodine  uptake 36.9%.Diffuse radiotracer uptake identified within both lobes of the thyroid  gland.    Thyroid  U/S (10/26/2018): Stable, small nodules: Parenchymal Echotexture:  Moderately heterogenous Isthmus: Normal in size measuring 0.4 cm in diameter Right lobe: Normal in size measuring 5.2 x 2.1 x 2.2 cm, unchanged previously, 4.1 x 1.7 x 1.9 cm Left lobe: Normal in size measuring 5.4 x 2.0 x 2.4 cm, unchanged, previously, 5.1 x 1.9 x 2.4 cm _________________________________________________________   Previously noted approximately 0.7 cm minimally complex cyst within the inferior pole the right lobe of the thyroid  is not demonstrated on the present examination.  __________________________________________________   The previously noted approximately 1.5 x 1.3 x 1.1 cm isoechoic nodule within the mid, inferior aspect the left lobe of the thyroid  is not seen on the present examination and thus may have represented a pseudonodule.   There is an approximately 1.0 x 1.0 x 0.8 cm isoechoic ill-defined nodule/pseudonodule within the mid aspect the left lobe of the thyroid  (labeled 1) was not definitely seen on the 2017 examination however does not meet imaging criteria to recommend percutaneous sampling or continued dedicated follow-up.   There is an approximately 1.0 x 0.9 x 0.9 cm partially cystic, partially solid nodule within the inferior pole the left lobe of the thyroid  (labeled 2), which was not definitely seen on the 2017 examination, however does not meet imaging criteria to recommend percutaneous sampling or continued dedicated follow-up.   IMPRESSION: 1. Findings suggestive of multinodular goiter. 2. No worrisome new or enlarging thyroid  nodules. 3. None of the discretely measured thyroid  nodules meet imaging criteria to recommend percutaneous sampling or continued dedicated follow-up.   She is on propranolol  120 mg in a.m. for tremors.  On lithium  for bipolar disorder.  Pt denies: - feeling nodules in neck - hoarseness - dysphagia - choking  Pt does have a FH of thyroid  ds on father's side of the family. No FH of thyroid  cancer. No h/o  radiation tx to head or neck. No herbal supplements. No Biotin use. No recent steroids use.   Hypercalcemia:  Pt 2 instances of elevated total calcium and also hyperparathyroidism. I reviewed pt's pertinent labs: Lab Results  Component Value Date   PTH 74 11/09/2022   PTH 57 06/08/2022   PTH 87 (H) 05/03/2022   PTH 75 (H) 08/11/2014   CALCIUM 10.5 (H) 12/05/2022   CALCIUM 10.7 (H) 11/09/2022   CALCIUM 9.8 05/03/2022   CALCIUM 10.6 (H) 11/04/2021   CALCIUM 10.2 08/12/2020   CALCIUM 10.5 (H) 09/26/2019   CALCIUM 10.1 08/16/2019   CALCIUM 9.9 02/15/2019   CALCIUM 10.1 08/22/2018   CALCIUM 10.2 08/15/2018   No fragility fractures.  No h/o kidney stones.  No h/o CKD. Last BUN/Cr: Lab Results  Component Value Date   BUN 24 12/05/2022   BUN 22 11/09/2022   CREATININE 1.06 (H) 12/05/2022   CREATININE 1.18 11/09/2022  Pt is not on HCTZ.  + h/o vitamin D  insufficiency. Reviewed vit D levels: Lab Results  Component Value Date   VD25OH 70.49 11/09/2022   VD25OH 89.62 06/08/2022   VD25OH 82.82 11/04/2021   VD25OH 86.55 08/12/2020   VD25OH 57.30 08/16/2019   VD25OH 56.99 08/11/2014   VD25OH 74 06/14/2010   VD25OH 21 (L) 05/04/2009   Pt was on calcium 333  mg 3x a week, and takes 5000 units vitamin D  daily.  04/2022, I advised her to take the vitamin D  every other day and stop the calcium supplement.   Reviewed most recent bone density scan results: 11/23/2022 Lumbar spine L1-L4 Femoral neck (FN) 33% distal radius Ultra distal radius  T-score +1.1 RFN: n/a LFN: -0.3 -1.6 -1.1   Pt does not have a FH of hypercalcemia, pituitary tumors, thyroid  cancer, or osteoporosis.   Pt. also has a history of GERD, psoriasis, bipolar dis., HTN, B12 deficiency.  + History of vaginal spotting-seeing OB/GYN (Dr. Andres Bangs).  On HRT. She had right TKR in 09/2019 >> she felt  much better after the sx.  ROS: + see HPI  I reviewed pt's medications, allergies, PMH, social hx, family hx, and  changes were documented in the history of present illness. Otherwise, unchanged from my initial visit note.  Past Medical History:  Diagnosis Date   Abnormal involuntary movements(781.0)    Acne    Allergic rhinitis    Arthritis    Asthma    Bipolar disorder, unspecified (HCC)    psych - Dr. Edwina Gram   Common migraine with intractable migraine 10/09/2018   Depression    Diaphragmatic hernia without mention of obstruction or gangrene    GERD (gastroesophageal reflux disease)    03/18/02, EGD, [facility] Gerd,gastritis, dilated   History of MRI of brain and brain stem 06/01/03   [facility]   Hypertension    Other psoriasis    Tremor of both hands 10/09/2018   Unspecified sinusitis (chronic)    Past Surgical History:  Procedure Laterality Date   COLONOSCOPY  03/18/02   internal hemorrhoids   COLONOSCOPY  06/2014   diverticulosis, rpt 10 yrs ([facility])   ESOPHAGOGASTRODUODENOSCOPY  1/99   [facility]; HH,GERD   ESOPHAGOGASTRODUODENOSCOPY  03/18/02   HH,GERD,Gastritis; Dilated   LAPAROSCOPIC ESOPHAGOGASTRIC FUNDOPLASTY  08/28/02   [name]   TOTAL HIP ARTHROPLASTY Right 10/04/2019   Procedure: RIGHT TOTAL HIP ARTHROPLASTY ANTERIOR APPROACH;  Surgeon: Arnie Lao, MD;  Location: [facility];  Service: Orthopedics;  Laterality: Right;   Social History   Social History   Marital status: Married    Spouse name: N/A   Number of children: 0   Occupational History   Housewife    Social History Main Topics   Smoking status: Former Smoker    Packs/day: 1.00    Years: 20.00    Types: Cigarettes    Quit date: 01/10/1998   Smokeless tobacco: Never Used   Alcohol use No   Drug use: No   Social History Narrative   Caffeine: drinks diet drinks   Lives with husband and 1 dog, 1 cat   Occupation: housewife   Activity: no regular exercise, doesn't feel safe to walk outside at home, could walk on mother's treadmill   Diet: good water , fruits/vegetables some    Current Outpatient Medications on File  Prior to Visit  Medication Sig Dispense Refill   buPROPion (WELLBUTRIN XL) 150 MG 24 hr tablet Take 2 tablets (300 mg total) by mouth daily.  busPIRone (BUSPAR) 7.5 MG tablet Take 7.5 mg by mouth 3 (three) times daily.     Cholecalciferol (VITAMIN D3) 125 MCG (5000 UT) CAPS Take 1 capsule (5,000 Units total) by mouth every other day.     citalopram (CELEXA) 20 MG tablet Take 1 tablet (20 mg total) by mouth daily.     Cyanocobalamin  (VITAMELTS ENERGY VITAMIN B-12) 1500 MCG TBDP Take 1 tablet by mouth every Monday, Wednesday, and Friday.     estradiol (ESTRACE) 0.5 MG tablet Take 1 tablet (0.5 mg total) by mouth daily.     ferrous sulfate  324 (65 Fe) MG TBEC Take 1 tablet (325 mg total) by mouth 3 (three) times a week.     lisinopril  (ZESTRIL ) 5 MG tablet Take 1 tablet (5 mg total) by mouth daily. 90 tablet 4   lithium  carbonate (LITHOBID) 300 MG ER tablet Take 1 tablet (300 mg total) by mouth at bedtime.     methimazole  (TAPAZOLE ) 5 MG tablet Take 0.5 tablets (2.5 mg total) by mouth every other day. 45 tablet 1   naltrexone (DEPADE) 50 MG tablet Take 50 mg by mouth 2 (two) times daily.     pantoprazole  (PROTONIX ) 40 MG tablet Take 1 tablet (40 mg total) by mouth daily. 90 tablet 4   progesterone (PROMETRIUM) 200 MG capsule Take 200 mg by mouth at bedtime.      propranolol  ER (INDERAL  LA) 120 MG 24 hr capsule Take 120 mg by mouth at bedtime.      tretinoin (RETIN-A) 0.05 % cream Apply 1 application topically at bedtime.     ziprasidone (GEODON) 80 MG capsule Take 240 mg by mouth at bedtime.     No current facility-administered medications on file prior to visit.   Allergies  Allergen Reactions   Amlodipine  Swelling    Ankle swelling   Hydrocodone-Acetaminophen      Pt says it does not work for her   Minocycline Hcl Itching   Family History  Problem Relation Age of Onset   Arthritis Mother        fibromyalgia   Alcohol abuse Father    Emphysema Brother        smoker   COPD Brother         smoker, continues.   Arthritis Sister        fibromyalgia   Cancer Neg Hx    Coronary artery disease Neg Hx    Stroke Neg Hx    Diabetes Neg Hx    PE: BP 128/60   Pulse (!) 56   Ht 5' 2.52" (1.588 m)   Wt 174 lb (78.9 kg)   SpO2 97%   BMI 31.30 kg/m  Wt Readings from Last 3 Encounters:  06/19/23 174 lb (78.9 kg)  11/29/22 175 lb (79.4 kg)  11/16/22 178 lb 4 oz (80.9 kg)   Constitutional: overweight, in NAD Eyes: EOMI, no exophthalmos ENT: no thyromegaly, no cervical lymphadenopathy Cardiovascular: RRR, No MRG Respiratory: CTA B Musculoskeletal: no deformities Skin: no rashes Neurological: +++ tremor with outstretched hands   ASSESSMENT: 1.  Graves' disease  2. Thyroid  nodules  3.  Hyperparathyroidism  PLAN:  1. Patient with history of thyrotoxicosis diagnosed when she presented with thyrotoxic symptoms: Tremors (however, these are likely due to lithium ), heat intolerance, palpitations, shortness of breath.  Patient started methimazole  and we will gradually able to decrease the dose and then stop completely in 06/2020.  TFTs were initially normal, but then TSH became suppressed so we started back on  methimazole , dose decreased to 2.5 mg every other day in 07/2022. -She continues to have tremors, which are chronic for her, and for which she is on Inderal .  She also has some heat intolerance which is not new.  Her weight dropped before last visit (by 10 pounds) after starting naltrexone-per psychiatry.  Weight is stable at today's visit. - Her TSI antibodies were elevated but improved at last check - we will repeat them now - Will repeat her TFTs today and change the methimazole  dose accordingly -She continues on beta-blocker for tremors, but we discussed that this can also help with heart rate in the setting of thyrotoxicosis -I will see her back in 1 year  2. Thyroid  nodules - No neck compression symptoms no masses felt on palpation of her neck today. -She has a  history of a 1 cm by cell-or slightly hypo-- echoic thyroid  nodule, with internal blood flow on her ultrasound from 08/2015.  The nodule appeared warm on the uptake and scan, which can either indicate an inflammatory nodule (pseudonodule) or a normal functioning nodule.  Since the nodule was seen in the context of thyrotoxicosis, we decided to wait until she was euthyroid to repeat the ultrasound then.  We repeated a thyroid  ultrasound in 10/2018 and this showed that 2 of her thyroid  nodules were not visible anymore and 2 nodules are very small and not worrisome. -Will continue to follow her clinically, no need for further imaging  3.  Hyperparathyroidism - Likely lithium  induced per PCP evaluation and further investigation at last visits -Her calcium levels are slightly elevated.  Ionized calcium was 5.6 (4.7-5.5) in 05/2022, and the total calcium was also elevated in 11/2022, at 10.5 (8.6-10.4).  Calcitriol and vitamin D  levels were normal.  Her vitamin D  level was slightly higher in the normal range, at 89.6.  I advised her to reduce her vitamin D  supplement from 5000 units daily to 5000 units every other day at that time.  Her vitamin D  level decreased to 70. She was also taking calcium supplements, 333 mg 3 times a week, which I advised her to stop in 05/2022.  At last visit, we checked a 24-hour urine calcium and this was not elevated, being 72 with an fractional excretion of calcium and 0.0105%, which rules out familial hypercalcemic hypocalciuria. -She has no apparent complications from hypercalcemia: No nephrolithiasis, osteoporosis, fractures.   - her most recent bone density scan is from 11/25/2022.  This showed normal T-scores at the level of the spine and left femoral neck (right femoral neck could not be analyzed), but low T-scores (in the osteopenia range at the 33% distal radius and ultra distal radius), with the low score at the 33% distal radius, the main site of action and parathyroid   hormone.  I recommended weightbearing exercises. - For now, we will follow her expectantly  Needs refills of MMI.  Orders Placed This Encounter  Procedures   TSH   T4, free   T3, free   Thyroid  stimulating immunoglobulin   Emilie Harden, MD PhD [facility]

## 2023-06-21 LAB — T4, FREE: Free T4: 0.9 ng/dL (ref 0.8–1.8)

## 2023-06-21 LAB — T3, FREE: T3, Free: 3.3 pg/mL (ref 2.3–4.2)

## 2023-06-21 LAB — TSH: TSH: 1.5 m[IU]/L (ref 0.40–4.50)

## 2023-06-21 LAB — THYROID STIMULATING IMMUNOGLOBULIN: TSI: 298 %{baseline} — ABNORMAL HIGH (ref <140)

## 2023-06-22 ENCOUNTER — Ambulatory Visit: Payer: Self-pay | Admitting: Internal Medicine

## 2023-06-22 MED ORDER — METHIMAZOLE 5 MG PO TABS: 2.5000 mg | ORAL_TABLET | ORAL | 1 refills | Status: AC

## 2023-06-22 NOTE — Addendum Note (Signed)
 Addended by: Emilie Harden on: 06/22/2023 08:59 AM   Modules accepted: Orders

## 2023-07-05 ENCOUNTER — Encounter: Payer: Self-pay | Admitting: Family Medicine

## 2023-07-05 ENCOUNTER — Ambulatory Visit: Admitting: Family Medicine

## 2023-07-05 VITALS — BP 134/86 | HR 52 | Temp 98.1°F | Ht 62.5 in | Wt 173.4 lb

## 2023-07-05 DIAGNOSIS — R0789 Other chest pain: Secondary | ICD-10-CM

## 2023-07-05 DIAGNOSIS — E0500 Thyrotoxicosis with diffuse goiter without thyrotoxic crisis or storm: Secondary | ICD-10-CM

## 2023-07-05 DIAGNOSIS — I10 Essential (primary) hypertension: Secondary | ICD-10-CM

## 2023-07-05 DIAGNOSIS — E211 Secondary hyperparathyroidism, not elsewhere classified: Secondary | ICD-10-CM

## 2023-07-05 DIAGNOSIS — G251 Drug-induced tremor: Secondary | ICD-10-CM

## 2023-07-05 DIAGNOSIS — F3170 Bipolar disorder, currently in remission, most recent episode unspecified: Secondary | ICD-10-CM

## 2023-07-05 NOTE — Assessment & Plan Note (Addendum)
 Overall atypical chest pain, new over last few weeks.  Low ASCVD risk.  Update EKG today. Discussed cardiology evaluation vs cardiac CT with CAC score - she declines both at this time. To let me know if ongoing or progressive symptoms of chest discomfort.  The 10-year ASCVD risk score (Arnett DK, et al., 2019) is: 4.4%   Values used to calculate the score:     Age: 63 years     Clincally relevant sex: Female     Is Non-Hispanic African American: No     Diabetic: No     Tobacco smoker: No     Systolic Blood Pressure: 134 mmHg     Is BP treated: Yes     HDL Cholesterol: 81.3 mg/dL     Total Cholesterol: 179 mg/dL

## 2023-07-05 NOTE — Assessment & Plan Note (Signed)
 Mild, has seen endo, now off calcium supplements, rec expectant management.

## 2023-07-05 NOTE — Assessment & Plan Note (Signed)
 Chronic, stable on current regimen - continue.

## 2023-07-05 NOTE — Patient Instructions (Addendum)
 Continue current medicines. EKG today Let me know if interested in proceeding with heart CT with calcium score ($100) at local imaging center for further screening for heart disease.  Let me know if any worsening chest pain symptoms  Return in 5 months for physical/wellness visit

## 2023-07-05 NOTE — Assessment & Plan Note (Signed)
 Appreciate endo care.

## 2023-07-05 NOTE — Progress Notes (Signed)
 Ph: [phone] Fax: [phone]   Patient ID: Katelyn Price Katelyn Price, female    DOB: 1960-06-26, 63 y.o.   MRN: [id number]  This visit was conducted in person.  BP 134/86   Pulse (!) 52   Temp 98.1 F (36.7 C) (Oral)   Ht 5' 2.5 (1.588 m)   Wt 173 lb 6 oz (78.6 kg)   SpO2 96%   BMI 31.21 kg/m    CC: 6 mo f/u visit  Subjective:   HPI: Katelyn Price is a 63 y.o. female presenting on 07/05/2023 for Medical Management of Chronic Issues (Here for 6 mo f/u.)   Graves disease followed by endo with latest labs stable. Also with h/o hyperparathyroidism attributed to lithium  levels. Calcium levels mildly elevated - she stopped calcium but continues vit D 5000 units every other day.  Lab Results  Component Value Date   TSH 1.50 06/19/2023   Bipolar - sees psych Olam Blackwater on lithium , celexa, buspar, wellbutrin and geodon.  Lithium -induced tremors - stable period, continues low dose lithium . Also on propranolol   HTN - Compliant with current antihypertensive regimen of lisinpril 5mg  daily, propranolol  120mg  nightly (for tremors) . Does not check blood pressures at home. No low blood pressure readings or symptoms of dizziness/syncope. Denies HA, vision changes, SOB, leg swelling.    Occasional chest pain that started a few weeks ago described as substernal ache/heaviness, not exertional, but does improve with rest. This started recently. She has attributed this to anxiety. No dyspnea, nausea, diaphoresis, jaw or left arm pain.   Notes daily bitemporal and vertex pressure headaches, attributed to sinus.      Relevant past medical, surgical, family and social history reviewed and updated as indicated. Interim medical history since our last visit reviewed. Allergies and medications reviewed and updated. Outpatient Medications Prior to Visit  Medication Sig Dispense Refill   buPROPion (WELLBUTRIN XL) 150 MG 24 hr tablet Take 2 tablets (300 mg total) by mouth daily.     busPIRone  (BUSPAR) 7.5 MG tablet Take 7.5 mg by mouth 3 (three) times daily.     Cholecalciferol (VITAMIN D3) 125 MCG (5000 UT) CAPS Take 1 capsule (5,000 Units total) by mouth every other day.     citalopram (CELEXA) 20 MG tablet Take 1 tablet (20 mg total) by mouth daily.     Cyanocobalamin  (VITAMELTS ENERGY VITAMIN B-12) 1500 MCG TBDP Take 1 tablet by mouth every Monday, Wednesday, and Friday.     estradiol (ESTRACE) 0.5 MG tablet Take 1 tablet (0.5 mg total) by mouth daily.     ferrous sulfate  324 (65 Fe) MG TBEC Take 1 tablet (325 mg total) by mouth 3 (three) times a week.     lisinopril  (ZESTRIL ) 5 MG tablet Take 1 tablet (5 mg total) by mouth daily. 90 tablet 4   lithium  carbonate (LITHOBID) 300 MG ER tablet Take 1 tablet (300 mg total) by mouth at bedtime.     methimazole  (TAPAZOLE ) 5 MG tablet Take 0.5 tablets (2.5 mg total) by mouth every other day. 45 tablet 1   naltrexone (DEPADE) 50 MG tablet Take 50 mg by mouth 2 (two) times daily.     pantoprazole  (PROTONIX ) 40 MG tablet Take 1 tablet (40 mg total) by mouth daily. 90 tablet 4   progesterone (PROMETRIUM) 200 MG capsule Take 100 mg by mouth at bedtime.     propranolol  ER (INDERAL  LA) 120 MG 24 hr capsule Take 120 mg by mouth at bedtime.  tretinoin (RETIN-A) 0.05 % cream Apply 1 application topically at bedtime.     ziprasidone (GEODON) 80 MG capsule Take 240 mg by mouth at bedtime.     No facility-administered medications prior to visit.     Per HPI unless specifically indicated in ROS section below Review of Systems  Objective:  BP 134/86   Pulse (!) 52   Temp 98.1 F (36.7 C) (Oral)   Ht 5' 2.5 (1.588 m)   Wt 173 lb 6 oz (78.6 kg)   SpO2 96%   BMI 31.21 kg/m   Wt Readings from Last 3 Encounters:  07/05/23 173 lb 6 oz (78.6 kg)  06/19/23 174 lb (78.9 kg)  11/29/22 175 lb (79.4 kg)      Physical Exam Vitals and nursing note reviewed.  Constitutional:      Appearance: Normal appearance. She is not ill-appearing.   HENT:     Head: Normocephalic and atraumatic.     Mouth/Throat:     Mouth: Mucous membranes are moist.     Pharynx: Oropharynx is clear. No oropharyngeal exudate or posterior oropharyngeal erythema.   Eyes:     Extraocular Movements: Extraocular movements intact.     Conjunctiva/sclera: Conjunctivae normal.     Pupils: Pupils are equal, round, and reactive to light.   Neck:     Thyroid : No thyroid  mass or thyromegaly.   Cardiovascular:     Rate and Rhythm: Normal rate and regular rhythm.     Pulses: Normal pulses.     Heart sounds: Normal heart sounds. No murmur heard. Pulmonary:     Effort: Pulmonary effort is normal. No respiratory distress.     Breath sounds: Normal breath sounds. No wheezing, rhonchi or rales.  Chest:     Chest wall: No tenderness (no reproducible chest wall tenderness).   Musculoskeletal:     Cervical back: Normal range of motion and neck supple.     Right lower leg: No edema.     Left lower leg: No edema.   Skin:    General: Skin is dry.     Findings: No rash.   Neurological:     Mental Status: She is alert.   Psychiatric:        Mood and Affect: Mood normal.        Behavior: Behavior normal.       Results for orders placed or performed in visit on 06/19/23  TSH   Collection Time: 06/19/23  8:15 AM  Result Value Ref Range   TSH 1.50 0.40 - 4.50 mIU/L  T4, free   Collection Time: 06/19/23  8:15 AM  Result Value Ref Range   Free T4 0.9 0.8 - 1.8 ng/dL  T3, free   Collection Time: 06/19/23  8:15 AM  Result Value Ref Range   T3, Free 3.3 2.3 - 4.2 pg/mL  Thyroid  stimulating immunoglobulin   Collection Time: 06/19/23  8:15 AM  Result Value Ref Range   TSI 298 (H) <140 % baseline   Lab Results  Component Value Date   CHOL 179 11/09/2022   HDL 81.30 11/09/2022   LDLCALC 75 11/09/2022   TRIG 115.0 11/09/2022   CHOLHDL 2 11/09/2022    Lab Results  Component Value Date   VITAMINB12 597 11/09/2022   Lab Results  Component Value  Date   VD25OH 70.49 11/09/2022    Lab Results  Component Value Date   NA 133 (L) 12/05/2022   CL 101 12/05/2022   K 4.0 12/05/2022  CO2 23 12/05/2022   BUN 24 12/05/2022   CREATININE 1.06 (H) 12/05/2022   GFR 49.76 (L) 11/09/2022   CALCIUM 10.5 (H) 12/05/2022   PHOS 3.0 06/08/2022   ALBUMIN 3.9 11/09/2022   GLUCOSE 108 (H) 12/05/2022    EKG - artifact due to chronic tremor, but based on lead II sinus bradycardia rate 50s with LAD, normal intervals   Assessment & Plan:   Problem List Items Addressed This Visit     Bipolar disorder (HCC)   HYPERTENSION, BENIGN ESSENTIAL   Chronic, stable on current regimen - continue.       Drug-induced tremor   Graves disease   Appreciate endo care.       Hyperparathyroidism due to lithium  therapy (HCC)   Mild, has seen endo, now off calcium supplements, rec expectant management.       Chest discomfort - Primary   Overall atypical chest pain, new over last few weeks.  Low ASCVD risk.  Update EKG today. Discussed cardiology evaluation vs cardiac CT with CAC score - she declines both at this time. To let me know if ongoing or progressive symptoms of chest discomfort.  The 10-year ASCVD risk score (Arnett DK, et al., 2019) is: 4.4%   Values used to calculate the score:     Age: 66 years     Clincally relevant sex: Female     Is Non-Hispanic African American: No     Diabetic: No     Tobacco smoker: No     Systolic Blood Pressure: 134 mmHg     Is BP treated: Yes     HDL Cholesterol: 81.3 mg/dL     Total Cholesterol: 179 mg/dL       Relevant Orders   EKG 12-Lead     No orders of the defined types were placed in this encounter.   Orders Placed This Encounter  Procedures   EKG 12-Lead    Patient Instructions  Continue current medicines. EKG today Let me know if interested in proceeding with heart CT with calcium score ($100) at local imaging center for further screening for heart disease.  Let me know if any worsening  chest pain symptoms  Return in 5 months for physical/wellness visit   Follow up plan: Return in about 5 months (around 12/05/2023) for annual exam, prior fasting for blood work, medicare wellness visit.  Anton Blas, MD

## 2023-12-04 ENCOUNTER — Ambulatory Visit: Admitting: Family Medicine

## 2023-12-04 ENCOUNTER — Encounter: Payer: Self-pay | Admitting: Family Medicine

## 2023-12-04 VITALS — BP 180/102 | HR 50 | Temp 98.4°F | Ht 62.5 in | Wt 172.2 lb

## 2023-12-04 DIAGNOSIS — Z23 Encounter for immunization: Secondary | ICD-10-CM | POA: Diagnosis not present

## 2023-12-04 DIAGNOSIS — F3170 Bipolar disorder, currently in remission, most recent episode unspecified: Secondary | ICD-10-CM | POA: Diagnosis not present

## 2023-12-04 DIAGNOSIS — R001 Bradycardia, unspecified: Secondary | ICD-10-CM

## 2023-12-04 DIAGNOSIS — E211 Secondary hyperparathyroidism, not elsewhere classified: Secondary | ICD-10-CM

## 2023-12-04 DIAGNOSIS — Z0000 Encounter for general adult medical examination without abnormal findings: Secondary | ICD-10-CM | POA: Diagnosis not present

## 2023-12-04 DIAGNOSIS — E0500 Thyrotoxicosis with diffuse goiter without thyrotoxic crisis or storm: Secondary | ICD-10-CM

## 2023-12-04 DIAGNOSIS — E538 Deficiency of other specified B group vitamins: Secondary | ICD-10-CM | POA: Diagnosis not present

## 2023-12-04 DIAGNOSIS — G251 Drug-induced tremor: Secondary | ICD-10-CM | POA: Diagnosis not present

## 2023-12-04 DIAGNOSIS — M85832 Other specified disorders of bone density and structure, left forearm: Secondary | ICD-10-CM | POA: Diagnosis not present

## 2023-12-04 DIAGNOSIS — I10 Essential (primary) hypertension: Secondary | ICD-10-CM

## 2023-12-04 DIAGNOSIS — Z7189 Other specified counseling: Secondary | ICD-10-CM

## 2023-12-04 DIAGNOSIS — L408 Other psoriasis: Secondary | ICD-10-CM

## 2023-12-04 DIAGNOSIS — K219 Gastro-esophageal reflux disease without esophagitis: Secondary | ICD-10-CM

## 2023-12-04 LAB — COMPREHENSIVE METABOLIC PANEL WITH GFR
ALT: 20 U/L (ref 0–35)
AST: 20 U/L (ref 0–37)
Albumin: 4.3 g/dL (ref 3.5–5.2)
Alkaline Phosphatase: 64 U/L (ref 39–117)
BUN: 15 mg/dL (ref 6–23)
CO2: 32 meq/L (ref 19–32)
Calcium: 10.5 mg/dL (ref 8.4–10.5)
Chloride: 97 meq/L (ref 96–112)
Creatinine, Ser: 0.99 mg/dL (ref 0.40–1.20)
GFR: 60.97 mL/min (ref >60.00)
Glucose, Bld: 93 mg/dL (ref 70–99)
Potassium: 4.9 meq/L (ref 3.5–5.1)
Sodium: 135 meq/L (ref 135–145)
Total Bilirubin: 0.6 mg/dL (ref 0.2–1.2)
Total Protein: 6.8 g/dL (ref 6.0–8.3)

## 2023-12-04 LAB — CBC WITH DIFFERENTIAL/PLATELET
Basophils Absolute: 0.1 K/uL (ref 0.0–0.1)
Basophils Relative: 0.8 % (ref 0.0–3.0)
Eosinophils Absolute: 0.3 K/uL (ref 0.0–0.7)
Eosinophils Relative: 4.4 % (ref 0.0–5.0)
HCT: 41.4 % (ref 36.0–46.0)
Hemoglobin: 13.8 g/dL (ref 12.0–15.0)
Lymphocytes Relative: 19.3 % (ref 12.0–46.0)
Lymphs Abs: 1.5 K/uL (ref 0.7–4.0)
MCHC: 33.4 g/dL (ref 30.0–36.0)
MCV: 94 fl (ref 78.0–100.0)
Monocytes Absolute: 0.6 K/uL (ref 0.1–1.0)
Monocytes Relative: 8.1 % (ref 3.0–12.0)
Neutro Abs: 5.1 K/uL (ref 1.4–7.7)
Neutrophils Relative %: 67.4 % (ref 43.0–77.0)
Platelets: 219 K/uL (ref 150.0–400.0)
RBC: 4.41 Mil/uL (ref 3.87–5.11)
RDW: 13.8 % (ref 11.5–15.5)
WBC: 7.5 K/uL (ref 4.0–10.5)

## 2023-12-04 LAB — LIPID PANEL
Cholesterol: 207 mg/dL — ABNORMAL HIGH (ref 0–200)
HDL: 94 mg/dL (ref >39.00)
LDL Cholesterol: 93 mg/dL (ref 0–99)
NonHDL: 113.09
Total CHOL/HDL Ratio: 2
Triglycerides: 99 mg/dL (ref 0.0–149.0)
VLDL: 19.8 mg/dL (ref 0.0–40.0)

## 2023-12-04 LAB — VITAMIN B12: Vitamin B-12: 487 pg/mL (ref 211–911)

## 2023-12-04 LAB — VITAMIN D 25 HYDROXY (VIT D DEFICIENCY, FRACTURES): VITD: 54.88 ng/mL (ref 30.00–100.00)

## 2023-12-04 MED ORDER — POLYETHYLENE GLYCOL 3350 17 GM/SCOOP PO POWD: 17.0000 g | Freq: Every day | ORAL | Status: AC | PRN

## 2023-12-04 MED ORDER — LISINOPRIL 10 MG PO TABS: 10.0000 mg | ORAL_TABLET | Freq: Every day | ORAL | 3 refills | Status: AC

## 2023-12-04 MED ORDER — VITAMIN D3 25 MCG (1000 UT) PO CAPS: 1.0000 | ORAL_CAPSULE | Freq: Every day | ORAL | Status: AC

## 2023-12-04 MED ORDER — PANTOPRAZOLE SODIUM 40 MG PO TBEC: 40.0000 mg | DELAYED_RELEASE_TABLET | Freq: Every day | ORAL | 3 refills | Status: AC

## 2023-12-04 NOTE — Assessment & Plan Note (Signed)
 Appreciate endo care.

## 2023-12-04 NOTE — Assessment & Plan Note (Signed)
 Thought Li-induced hyperparathyroidism, now followed by endo.

## 2023-12-04 NOTE — Assessment & Plan Note (Addendum)
 Sees psychiatry q6 mo Update labs including Li levels.

## 2023-12-04 NOTE — Assessment & Plan Note (Signed)
 Established with Dr Keenan dermatology on [place]

## 2023-12-04 NOTE — Assessment & Plan Note (Signed)
Chronic, continue daily PPI.

## 2023-12-04 NOTE — Patient Instructions (Addendum)
 Flu shot today  Finish living will/advanced directive, bring us  a copy.  Blood pressure was too high today - possibly wellbutrin related. Touch base with psychiatrist about any medicines that can increase blood pressure Heart rate runs low - we will continue to watch this  Increase lisinopril  to 10mg  daily. Start monitoring blood pressures at home and keep log with BP lot sheet provided today. Drop off log sheet after 2-3 weeks to review.  Return in 3 months for blood pressure follow up visit   Your goal blood pressure is <140/90. Work on low salt/sodium diet - goal <2 grams (2,000mg ) per day. Eat a diet high in fruits/vegetables and whole grains.  Look into mediterranean and DASH diet. Goal activity is 189min/wk of moderate intensity exercise.  This can be split into 30 minute chunks.  If you are not at this level, you can start with smaller 10-15 min increments and slowly build up activity. Look at www.heart.org for more resources

## 2023-12-04 NOTE — Assessment & Plan Note (Deleted)
 Thought related to Li-induced hyperparathyroidism, now followed by endo.

## 2023-12-04 NOTE — Assessment & Plan Note (Signed)
 Has not completed. Has packet at home. Encouraged she complete and bring us  a copy.

## 2023-12-04 NOTE — Assessment & Plan Note (Signed)
 Li induced Dose reduced last year Continues propranolol , caution with bradycardia side effect

## 2023-12-04 NOTE — Assessment & Plan Note (Signed)
Update levels on replacement 

## 2023-12-04 NOTE — Progress Notes (Addendum)
 Ph: [phone] Fax: [phone]   Patient ID: Katelyn Price Katelyn Price, female    DOB: [birth year], 63 y.o.   MRN: [id number]  This visit was conducted in person.  BP (!) 180/102   Pulse (!) 50   Temp 98.4 F (36.9 C) (Oral)   Ht 5' 2.5 (1.588 m)   Wt 172 lb 4 oz (78.1 kg)   SpO2 96%   BMI 31.00 kg/m   BP Readings from Last 3 Encounters:  12/04/23 (!) 180/102  07/05/23 134/86  06/19/23 128/60  180s/110 on repeat testing, pulse 50  CC: CPE  Subjective:   HPI: Katelyn Price is a 63 y.o. female presenting on 12/04/2023 for Annual Exam   On naltrexone 50mg  bid for weight loss through .   S/p R hip replacement 09/2019 Jan)    Graves disease managed by endo Geralynn), h/o thyroid  nodules. She is now back on low dose methimazole .  Hyperparathyroidism with borderline calcium levels. Saw endo, vit D replacement dropped, rec stop oral calcium supplements. No hypercalcemia complications. Upcoming DEXA.   Tremors - saw Dr Jenel thought lithium  induced tremor - advised taper off. Started on topamax  for migraines - but actually worsened headaches so she stopped.   Bipolar d/o followed by psychiatry NP Olam Blackwater on lithium , celexa, buspar geodon and wellbutrin. Upcoming appt 01/2024.   GERD - continues daily PPI.  Psoriasis - on new medicine through dermatology   Preventative: COLONOSCOPY 06/2014 diverticulosis, rpt 10 yrs [facility])  Well woman with [facility] G[facility] Dr. Rosalynn (retired), new doc Dr German last seen 03/2023. Continues Estrace 0.5mg  daily, pt states GYN stopped progesterone due to vaginal bleed.  Mammogram through GYN - 09/2022 Birads1 DEXA 11/2022 - T -1.6 L forearm  Lung cancer screening - not eligible  Flu shot yearly COVID vaccine - J&J 05/2019. Rpt 10/2020.  Tdap - 06/2011 - consider rpt next  Shingrix - 08/2019, 11/2019.  Advanced directive - discussed. Doesn't have this. Would want husband to be HCPOA. Full code. Wouldn't want prolonged life support  if terminal condition. Packet previously provided.  Seat belt use discussed  Sunscreen use discussed, no changing moles on skin  Ex smoker - quit 2007. Husband smokes pipe and e-cig  Alcohol - none  Dentist yearly Eye exam - yearly  Bowel - no constipation - takes bowel regimen with miralax  regularly  Bladder - some stress incontinence    Caffeine: drinks diet drinks   Lives with husband and 1 dog, 1 cat   Occupation: housewife  Activity: no regular exercise  Diet: good water , fruits/vegetables some     Relevant past medical, surgical, family and social history reviewed and updated as indicated. Interim medical history since our last visit reviewed. Allergies and medications reviewed and updated. Outpatient Medications Prior to Visit  Medication Sig Dispense Refill   buPROPion (WELLBUTRIN XL) 300 MG 24 hr tablet Take 1 tablet (300 mg total) by mouth daily.     busPIRone (BUSPAR) 7.5 MG tablet Take 7.5 mg by mouth 3 (three) times daily.     citalopram (CELEXA) 20 MG tablet Take 1 tablet (20 mg total) by mouth daily.     Cyanocobalamin  (VITAMELTS ENERGY VITAMIN B-12) 1500 MCG TBDP Take 1 tablet by mouth every Monday, Wednesday, and Friday.     Deucravacitinib (SOTYKTU) 6 MG TABS Take 1 tablet by mouth daily.     estradiol (ESTRACE) 0.5 MG tablet Take 1 tablet (0.5 mg total) by mouth daily.  ferrous sulfate  324 (65 Fe) MG TBEC Take 1 tablet (325 mg total) by mouth 3 (three) times a week.     lithium  carbonate (LITHOBID) 300 MG ER tablet Take 1 tablet (300 mg total) by mouth at bedtime.     methimazole  (TAPAZOLE ) 5 MG tablet Take 0.5 tablets (2.5 mg total) by mouth every other day. 45 tablet 1   naltrexone (DEPADE) 50 MG tablet Take 50 mg by mouth 2 (two) times daily.     progesterone (PROMETRIUM) 200 MG capsule Take 100 mg by mouth at bedtime.     propranolol  ER (INDERAL  LA) 120 MG 24 hr capsule Take 120 mg by mouth at bedtime.      tretinoin (RETIN-A) 0.05 % cream Apply 1  application topically at bedtime.     ziprasidone (GEODON) 80 MG capsule Take 240 mg by mouth at bedtime.     buPROPion (WELLBUTRIN XL) 150 MG 24 hr tablet Take 2 tablets (300 mg total) by mouth daily.     Cholecalciferol (VITAMIN D3) 125 MCG (5000 UT) CAPS Take 1 capsule (5,000 Units total) by mouth every other day.     lisinopril  (ZESTRIL ) 5 MG tablet Take 1 tablet (5 mg total) by mouth daily. 90 tablet 4   pantoprazole  (PROTONIX ) 40 MG tablet Take 1 tablet (40 mg total) by mouth daily. 90 tablet 4   No facility-administered medications prior to visit.     Per HPI unless specifically indicated in ROS section below Review of Systems  Constitutional:  Negative for activity change, appetite change, chills, fatigue, fever and unexpected weight change.  HENT:  Negative for hearing loss.   Eyes:  Negative for visual disturbance.  Respiratory:  Positive for cough (occ). Negative for chest tightness, shortness of breath and wheezing.   Cardiovascular:  Negative for chest pain, palpitations and leg swelling.  Gastrointestinal:  Positive for abdominal pain (occ). Negative for abdominal distention, blood in stool, constipation, diarrhea, nausea and vomiting.  Genitourinary:  Negative for difficulty urinating and hematuria.  Musculoskeletal:  Negative for arthralgias, myalgias and neck pain.  Skin:  Negative for rash.  Neurological:  Negative for dizziness, seizures, syncope and headaches.  Hematological:  Negative for adenopathy. Does not bruise/bleed easily.  Psychiatric/Behavioral:  Negative for dysphoric mood. The patient is not nervous/anxious.     Objective:  BP (!) 180/102   Pulse (!) 50   Temp 98.4 F (36.9 C) (Oral)   Ht 5' 2.5 (1.588 m)   Wt 172 lb 4 oz (78.1 kg)   SpO2 96%   BMI 31.00 kg/m   Wt Readings from Last 3 Encounters:  12/04/23 172 lb 4 oz (78.1 kg)  07/05/23 173 lb 6 oz (78.6 kg)  06/19/23 174 lb (78.9 kg)      Physical Exam Vitals and nursing note reviewed.   Constitutional:      Appearance: Normal appearance. She is not ill-appearing.  HENT:     Head: Normocephalic and atraumatic.     Right Ear: Tympanic membrane, ear canal and external ear normal. There is no impacted cerumen.     Left Ear: Tympanic membrane, ear canal and external ear normal. There is no impacted cerumen.     Mouth/Throat:     Mouth: Mucous membranes are moist.     Pharynx: Oropharynx is clear. No oropharyngeal exudate or posterior oropharyngeal erythema.  Eyes:     General:        Right eye: No discharge.        Left  eye: No discharge.     Extraocular Movements: Extraocular movements intact.     Conjunctiva/sclera: Conjunctivae normal.     Pupils: Pupils are equal, round, and reactive to light.  Neck:     Thyroid : No thyroid  mass or thyromegaly.     Vascular: No carotid bruit.  Cardiovascular:     Rate and Rhythm: Regular rhythm. Bradycardia present.     Pulses: Normal pulses.     Heart sounds: Normal heart sounds. No murmur heard. Pulmonary:     Effort: Pulmonary effort is normal. No respiratory distress.     Breath sounds: Normal breath sounds. No wheezing, rhonchi or rales.  Abdominal:     General: Bowel sounds are normal. There is no distension.     Palpations: Abdomen is soft. There is no mass.     Tenderness: There is no abdominal tenderness. There is no guarding or rebound.     Hernia: No hernia is present.  Musculoskeletal:     Cervical back: Normal range of motion and neck supple. No rigidity.     Right lower leg: No edema.     Left lower leg: No edema.  Lymphadenopathy:     Cervical: No cervical adenopathy.  Skin:    General: Skin is warm and dry.     Findings: No rash.  Neurological:     General: No focal deficit present.     Mental Status: She is alert. Mental status is at baseline.  Psychiatric:        Mood and Affect: Mood normal.        Behavior: Behavior normal.       Results for orders placed or performed in visit on 06/19/23  TSH    Collection Time: 06/19/23  8:15 AM  Result Value Ref Range   TSH 1.50 0.40 - 4.50 mIU/L  T4, free   Collection Time: 06/19/23  8:15 AM  Result Value Ref Range   Free T4 0.9 0.8 - 1.8 ng/dL  T3, free   Collection Time: 06/19/23  8:15 AM  Result Value Ref Range   T3, Free 3.3 2.3 - 4.2 pg/mL  Thyroid  stimulating immunoglobulin   Collection Time: 06/19/23  8:15 AM  Result Value Ref Range   TSI 298 (H) <140 % baseline    Assessment & Plan:   Problem List Items Addressed This Visit     Health maintenance examination - Primary (Chronic)   Preventative protocols reviewed and updated unless pt declined. Discussed healthy diet and lifestyle.       Advanced directives, counseling/discussion (Chronic)   Has not completed. Has packet at home. Encouraged she complete and bring us  a copy.       Bipolar disorder [facility])   Sees psychiatry q6 mo Update labs including Li levels.       Relevant Orders   Comprehensive metabolic panel with GFR   CBC with Differential/Platelet   Lithium  level   HYPERTENSION, BENIGN ESSENTIAL   Chronic, deteriorated control.  Increase lisinopril  from 5mg  to 10mg  daily. Rec increase potassium rich foods, decrease salt/sodium intake, start monitoring BP at home (log sheet provided today).  RTC 3 mo HTN f/u visit  ?med related ie wellbutrin      Relevant Medications   lisinopril  (ZESTRIL ) 10 MG tablet   Other Relevant Orders   Lipid panel   Comprehensive metabolic panel with GFR   GERD (gastroesophageal reflux disease)   Chronic, continue daily PPI      Relevant Medications   pantoprazole  (PROTONIX )  40 MG tablet   polyethylene glycol powder (GLYCOLAX /MIRALAX ) 17 GM/SCOOP powder   PSORIASIS NEC   Established with Dr Keenan dermatology on [place]      Drug-induced tremor   Li induced Dose reduced last year Continues propranolol , caution with bradycardia side effect      Vitamin B12 deficiency   Update levels on replacement.        Relevant Orders   Vitamin B12   Graves disease   Appreciate endo care.       Bradycardia   Chronic, asxs, thought propranolol  related      Hyperparathyroidism due to lithium  therapy   Thought Li-induced hyperparathyroidism, now followed by endo.       Osteopenia   Chronic, continue dietary calcium, vit D 1000 units daily, regular walking.       Relevant Orders   VITAMIN D  25 Hydroxy (Vit-D Deficiency, Fractures)   Other Visit Diagnoses       Encounter for immunization       Relevant Orders   Flu vaccine trivalent PF, 6mos and older(Flulaval,Afluria,Fluarix,Fluzone) (Completed)        Meds ordered this encounter  Medications   Cholecalciferol (VITAMIN D3) 25 MCG (1000 UT) CAPS    Sig: Take 1 capsule (1,000 Units total) by mouth daily.   lisinopril  (ZESTRIL ) 10 MG tablet    Sig: Take 1 tablet (10 mg total) by mouth daily.    Dispense:  90 tablet    Refill:  3    Note new dose   pantoprazole  (PROTONIX ) 40 MG tablet    Sig: Take 1 tablet (40 mg total) by mouth daily.    Dispense:  90 tablet    Refill:  3   polyethylene glycol powder (GLYCOLAX /MIRALAX ) 17 GM/SCOOP powder    Sig: Take 17 g by mouth daily as needed for moderate constipation. Dissolve 1 capful (17g) in 4-8 ounces of liquid and take by mouth daily.    Orders Placed This Encounter  Procedures   Flu vaccine trivalent PF, 6mos and older(Flulaval,Afluria,Fluarix,Fluzone)   Lipid panel   Comprehensive metabolic panel with GFR   CBC with Differential/Platelet   Vitamin B12   VITAMIN D  25 Hydroxy (Vit-D Deficiency, Fractures)   Lithium  level    Patient Instructions  Flu shot today  Finish living will/advanced directive, bring us  a copy.  Blood pressure was too high today - possibly wellbutrin related. Touch base with psychiatrist about any medicines that can increase blood pressure Heart rate runs low - we will continue to watch this  Increase lisinopril  to 10mg  daily. Start monitoring blood pressures  at home and keep log with BP lot sheet provided today. Drop off log sheet after 2-3 weeks to review.  Return in 3 months for blood pressure follow up visit   Your goal blood pressure is <140/90. Work on low salt/sodium diet - goal <2 grams (2,000mg ) per day. Eat a diet high in fruits/vegetables and whole grains.  Look into mediterranean and DASH diet. Goal activity is 140min/wk of moderate intensity exercise.  This can be split into 30 minute chunks.  If you are not at this level, you can start with smaller 10-15 min increments and slowly build up activity. Look at www.heart.org for more resources   Follow up plan: Return in about 3 months (around 03/05/2024) for follow up visit.  Anton Blas, MD

## 2023-12-04 NOTE — Assessment & Plan Note (Signed)
 Chronic, asxs, thought propranolol  related

## 2023-12-04 NOTE — Assessment & Plan Note (Signed)
 Chronic, continue dietary calcium, vit D 1000 units daily, regular walking.

## 2023-12-04 NOTE — Assessment & Plan Note (Addendum)
 Chronic, deteriorated control.  Increase lisinopril  from 5mg  to 10mg  daily. Rec increase potassium rich foods, decrease salt/sodium intake, start monitoring BP at home (log sheet provided today).  RTC 3 mo HTN f/u visit  ?med related ie wellbutrin

## 2023-12-04 NOTE — Assessment & Plan Note (Signed)
 Preventative protocols reviewed and updated unless pt declined. Discussed healthy diet and lifestyle.

## 2023-12-05 LAB — LITHIUM LEVEL: Lithium Lvl: 0.5 mmol/L — ABNORMAL LOW (ref 0.6–1.2)

## 2023-12-11 ENCOUNTER — Ambulatory Visit: Payer: Self-pay | Admitting: Family Medicine

## 2023-12-30 ENCOUNTER — Other Ambulatory Visit: Payer: Self-pay | Admitting: Family Medicine

## 2024-01-01 NOTE — Telephone Encounter (Signed)
 Dose change.  Per 12/04/23 OV notes, Dr Rilla changed dose to 10 mg tab. New rx sent 12/04/23, #90/3 refills to [facility].   Request denied.

## 2024-01-15 ENCOUNTER — Encounter: Payer: Self-pay | Admitting: Family Medicine

## 2024-02-28 ENCOUNTER — Ambulatory Visit: Admitting: Family Medicine

## 2024-06-18 ENCOUNTER — Ambulatory Visit: Admitting: Internal Medicine
